# Patient Record
Sex: Female | Born: 1970 | Race: Asian | Hispanic: No | Marital: Married | Smoking: Never smoker
Health system: Southern US, Community
[De-identification: ages and names within clinical notes are randomized; demographics above are authoritative.]

## PROBLEM LIST (undated history)

## (undated) DIAGNOSIS — R87610 Atypical squamous cells of undetermined significance on cytologic smear of cervix (ASC-US): Secondary | ICD-10-CM

## (undated) DIAGNOSIS — F329 Major depressive disorder, single episode, unspecified: Secondary | ICD-10-CM

## (undated) DIAGNOSIS — F32A Depression, unspecified: Secondary | ICD-10-CM

## (undated) DIAGNOSIS — T7840XA Allergy, unspecified, initial encounter: Secondary | ICD-10-CM

## (undated) DIAGNOSIS — G43909 Migraine, unspecified, not intractable, without status migrainosus: Secondary | ICD-10-CM

## (undated) HISTORY — PX: APPENDECTOMY: SHX54

## (undated) HISTORY — DX: Major depressive disorder, single episode, unspecified: F32.9

## (undated) HISTORY — DX: Depression, unspecified: F32.A

## (undated) HISTORY — DX: Allergy, unspecified, initial encounter: T78.40XA

## (undated) HISTORY — PX: TONSILLECTOMY: SUR1361

## (undated) HISTORY — DX: Migraine, unspecified, not intractable, without status migrainosus: G43.909

## (undated) HISTORY — PX: NASAL SINUS SURGERY: SHX719

## (undated) HISTORY — DX: Atypical squamous cells of undetermined significance on cytologic smear of cervix (ASC-US): R87.610

---

## 2002-02-19 ENCOUNTER — Emergency Department (HOSPITAL_COMMUNITY): Admission: EM | Admit: 2002-02-19 | Discharge: 2002-02-19 | Payer: Self-pay | Admitting: Emergency Medicine

## 2002-02-19 ENCOUNTER — Encounter: Payer: Self-pay | Admitting: Emergency Medicine

## 2002-02-26 ENCOUNTER — Encounter: Admission: RE | Admit: 2002-02-26 | Discharge: 2002-02-26 | Payer: Self-pay | Admitting: Internal Medicine

## 2002-03-05 ENCOUNTER — Ambulatory Visit (HOSPITAL_COMMUNITY): Admission: RE | Admit: 2002-03-05 | Discharge: 2002-03-05 | Payer: Self-pay | Admitting: Internal Medicine

## 2002-03-26 ENCOUNTER — Encounter: Admission: RE | Admit: 2002-03-26 | Discharge: 2002-03-26 | Payer: Self-pay | Admitting: Internal Medicine

## 2002-03-28 ENCOUNTER — Encounter: Payer: Self-pay | Admitting: Internal Medicine

## 2002-03-28 ENCOUNTER — Ambulatory Visit (HOSPITAL_COMMUNITY): Admission: RE | Admit: 2002-03-28 | Discharge: 2002-03-28 | Payer: Self-pay | Admitting: Internal Medicine

## 2002-03-28 ENCOUNTER — Encounter: Admission: RE | Admit: 2002-03-28 | Discharge: 2002-03-28 | Payer: Self-pay | Admitting: Internal Medicine

## 2002-04-01 ENCOUNTER — Encounter: Payer: Self-pay | Admitting: Internal Medicine

## 2002-04-01 ENCOUNTER — Ambulatory Visit (HOSPITAL_COMMUNITY): Admission: RE | Admit: 2002-04-01 | Discharge: 2002-04-01 | Payer: Self-pay | Admitting: Internal Medicine

## 2002-07-02 ENCOUNTER — Encounter (INDEPENDENT_AMBULATORY_CARE_PROVIDER_SITE_OTHER): Payer: Self-pay | Admitting: Specialist

## 2002-07-03 ENCOUNTER — Inpatient Hospital Stay (HOSPITAL_COMMUNITY): Admission: EM | Admit: 2002-07-03 | Discharge: 2002-07-05 | Payer: Self-pay | Admitting: Emergency Medicine

## 2002-07-03 ENCOUNTER — Encounter: Payer: Self-pay | Admitting: Emergency Medicine

## 2002-12-19 ENCOUNTER — Emergency Department (HOSPITAL_COMMUNITY): Admission: EM | Admit: 2002-12-19 | Discharge: 2002-12-20 | Payer: Self-pay

## 2002-12-20 ENCOUNTER — Encounter: Payer: Self-pay | Admitting: Emergency Medicine

## 2002-12-22 ENCOUNTER — Emergency Department (HOSPITAL_COMMUNITY): Admission: EM | Admit: 2002-12-22 | Discharge: 2002-12-22 | Payer: Self-pay | Admitting: Emergency Medicine

## 2003-04-05 ENCOUNTER — Emergency Department (HOSPITAL_COMMUNITY): Admission: EM | Admit: 2003-04-05 | Discharge: 2003-04-06 | Payer: Self-pay

## 2003-04-05 ENCOUNTER — Inpatient Hospital Stay (HOSPITAL_COMMUNITY): Admission: AD | Admit: 2003-04-05 | Discharge: 2003-04-05 | Payer: Self-pay | Admitting: *Deleted

## 2003-06-05 ENCOUNTER — Encounter: Payer: Self-pay | Admitting: Obstetrics & Gynecology

## 2003-06-05 ENCOUNTER — Ambulatory Visit (HOSPITAL_COMMUNITY): Admission: RE | Admit: 2003-06-05 | Discharge: 2003-06-05 | Payer: Self-pay | Admitting: Obstetrics & Gynecology

## 2003-09-26 ENCOUNTER — Encounter: Payer: Self-pay | Admitting: Obstetrics & Gynecology

## 2003-09-26 ENCOUNTER — Ambulatory Visit (HOSPITAL_COMMUNITY): Admission: RE | Admit: 2003-09-26 | Discharge: 2003-09-26 | Payer: Self-pay | Admitting: Obstetrics & Gynecology

## 2003-10-26 ENCOUNTER — Inpatient Hospital Stay (HOSPITAL_COMMUNITY): Admission: AD | Admit: 2003-10-26 | Discharge: 2003-10-28 | Payer: Self-pay | Admitting: Obstetrics

## 2008-05-17 ENCOUNTER — Emergency Department (HOSPITAL_COMMUNITY): Admission: EM | Admit: 2008-05-17 | Discharge: 2008-05-17 | Payer: Self-pay | Admitting: Emergency Medicine

## 2009-02-20 ENCOUNTER — Ambulatory Visit: Payer: Self-pay | Admitting: Gastroenterology

## 2009-02-20 DIAGNOSIS — R1319 Other dysphagia: Secondary | ICD-10-CM

## 2009-02-20 DIAGNOSIS — K219 Gastro-esophageal reflux disease without esophagitis: Secondary | ICD-10-CM | POA: Insufficient documentation

## 2009-02-23 ENCOUNTER — Ambulatory Visit: Payer: Self-pay | Admitting: Gastroenterology

## 2010-01-05 ENCOUNTER — Encounter: Payer: Self-pay | Admitting: Cardiovascular Disease

## 2010-01-11 ENCOUNTER — Ambulatory Visit: Payer: Self-pay | Admitting: Cardiovascular Disease

## 2010-01-11 ENCOUNTER — Encounter: Payer: Self-pay | Admitting: Cardiology

## 2010-01-11 ENCOUNTER — Ambulatory Visit: Payer: Self-pay

## 2011-01-04 NOTE — Assessment & Plan Note (Signed)
Summary: ekg only/per dr Anne Hahn  Nurse Visit   Vital Signs:  Patient profile:   40 year old female Height:      57 inches Weight:      103 pounds BMI:     22.37 Resp:     14 per minute  Allergies: 1)  ! Amoxicillin

## 2011-01-04 NOTE — Consult Note (Signed)
Summary: [facility] Referral Form  [facility] Referral Form   Imported By: Roderic Ovens 02/05/2010 14:07:28  _____________________________________________________________________  External Attachment:    Type:   Image     Comment:   External Document

## 2011-01-04 NOTE — Procedures (Signed)
Summary: SUMMARY REPORT  SUMMARY REPORT   Imported By: Mirna Mires 01/18/2010 16:00:02  _____________________________________________________________________  External Attachment:    Type:   Image     Comment:   External Document

## 2011-04-22 NOTE — H&P (Signed)
[name], Zeis Rogers                              ACCOUNT NO.:  [id number]   MEDICAL RECORD NO.:  [id number]                   PATIENT TYPE:  INP   LOCATION:  5008                                 FACILITY:  [facility]   PHYSICIAN:  Adolph Pollack, M.D.            DATE OF BIRTH:  Jun 28, 1971   DATE OF ADMISSION:  07/02/2002  DATE OF DISCHARGE:  07/05/2002                                HISTORY & PHYSICAL   CHIEF COMPLAINT:  Lower abdominal pain.   HISTORY OF PRESENT ILLNESS:  Erika Rogers is a 40 year old [place] female  who has been in the [place] since January.  She has been having  intermittent lower abdominal pain since that time.  She was actually seen in  the emergency department back in March for abdominal pain and at that time  underwent an abdominal ultrasound.  It was noted that her amylase at 235.  Her abdominal ultrasound was normal.  She subsequently was placed on Zantac.  She has continued to intermittent pain and right now she has had severe  lower abdominal pain that began at 5:00 yesterday evening and has persisted.  It has been associated with nausea and vomiting.  History has been obtained  by way of an interpreter.   PAST MEDICAL HISTORY:  No chronic illnesses.  Previous operations:  None.   ALLERGIES:  None.   MEDICATIONS:  Zantac.   SOCIAL HISTORY:  No tobacco or alcohol use.   REVIEW OF SYSTEMS:  CARDIOVASCULAR:  No known heart disease or hypertension.  PULMONARY:  No chronic lung disease.  RENAL:  No chronic disease.  GYN:  She  has had two miscarriages and has very painful menstrual periods.   PHYSICAL EXAMINATION:  Generally tired-appearing thin female with  temperature 97.7, blood pressure 113/77, pulse 81.  Her extraocular ocular  motions are intact.  Neck supple without palpable masses.  Cardiovascular:  Heart demonstrates a regular rate and rhythm.  Respiratory:  Breath sounds  are equal and clear.  Respirations unlabored.  Abdomen is soft with  right  lower quadrant tenderness and guarding.  No masses palpable.  Hyperactive  bowel sounds noted.  Extremities:  No cyanosis or edema.   LABORATORY DATA:  White blood cell count is 14,400, hemoglobin 11.1.  Chem  normal except for potassium of 3.3 and a glucose of 119.  Amylase 169.  Urine pregnancy test negative.  UA demonstrates 3-6 white blood cells.  There are many epithelial cells and many bacteria.   CT scan demonstrates findings consistent with appendiceal fecalith and acute  appendicitis.   IMPRESSION:  Acute appendicitis.    PLAN:  Laparoscopic appendectomy.  The procedure and risks including but not  limited to bleeding, infection, general anesthesia, accidental damage to  intra-abdominal organs were explained to her by way of an interpreter.  She  seems to understands and agrees to proceed.  Adolph Pollack, M.D.    [name]  D:  07/03/2002  T:  07/05/2002  Job:  [id number]

## 2011-04-22 NOTE — Op Note (Signed)
NAMEAricela, Bertagnolli H'NE                              ACCOUNT NO.:  [id number]   MEDICAL RECORD NO.:  [id number]                   PATIENT TYPE:  INP   LOCATION:  5008                                 FACILITY:  [facility]   PHYSICIAN:  Adolph Pollack, M.D.            DATE OF BIRTH:  January 08, 1971   DATE OF PROCEDURE:  07/03/2002  DATE OF DISCHARGE:                                 OPERATIVE REPORT   PREOPERATIVE DIAGNOSIS:  Acute appendicitis.   POSTOPERATIVE DIAGNOSIS:  Acute appendicitis.   OPERATION/PROCEDURE:  Laparoscopic appendectomy.   SURGEON:  Adolph Pollack, M.D.   ANESTHESIA:  General endotracheal anesthesia.   INDICATIONS:  The patient is a 40 year old [place] female who speaks no  English with the onset of abdominal pain at 5 p.m. yesterday.  It is lower  abdominal pain.  She was evaluated in the emergency department and underwent  a CT scan which was consistent with appendicitis.  She is now brought to the  operating room.  The procedure and risks were discussed with her by way of  an interpreter.   DESCRIPTION OF PROCEDURE:  She was placed supine on the operating table and  general anesthesia was administered.  A Foley catheter was placed in the  bladder.  Her abdomen was sterilely prepped and draped.   A small subumbilical incision was made incising the skin and subcutaneous  tissue sharply.  A 1 cm incision was made in the midline fascia.  Abdominal  cavity was entered sharply and under direct vision.  A pursestring suture  with 3-0 Vicryl was placed around the fascial edges.  A Hasson trocar was  introduced into the peritoneal cavity and pneumoperitoneum was created by  insufflation of CO2 gas.  Next, a laparoscope was introduced.  I positioned  her in Trendelenburg with the right side tilted up.  A 5 mm trocar was  placed in the left lower quadrant.  We began manipulating the cecum and  noted an inflamed appendix that was adherent to the terminal ileum.  I  was  able the carefully and bluntly dissect this free.  The attachments of the  proximal appendage to the lateral sidewall were divided sharply.  I then  divided the mesoappendix with the Harmonic scalpel.  I came to the point of  the appendiceal cecal junction.  I then used the Endo GIA stapler to  amputate the appendix off the cecum.  Staple line was hemostatic and intact.   The appendix was put in an Endopouch bag and removed through the  subumbilical port.  We then copiously irrigated out the area with a liter of  normal saline.  No bleeding was noted.  The evacuated fluid was cleaned.  I  then removed the remaining trocars.  I closed the subumbilical fascia defect  by tightening up and tying down the pursestring suture after allowing the  pneumoperitoneum to escape.  The skin incisions were closed with 4-0  Monocryl subcuticular stitches.  Steri-Strips and sterile dressings were  applied.   She tolerated the procedure well with any apparent complications and was  taken to the recovery room in satisfactory condition.                                                Adolph Pollack, M.D.    [name]  D:  07/03/2002  T:  07/05/2002  Job:  [id number]

## 2012-08-06 ENCOUNTER — Ambulatory Visit (INDEPENDENT_AMBULATORY_CARE_PROVIDER_SITE_OTHER): Payer: Managed Care, Other (non HMO) | Admitting: Physician Assistant

## 2012-08-06 ENCOUNTER — Encounter: Payer: Self-pay | Admitting: Physician Assistant

## 2012-08-06 VITALS — BP 110/80 | HR 79 | Temp 98.6°F | Resp 16 | Ht <= 58 in | Wt 110.0 lb

## 2012-08-06 DIAGNOSIS — Z0000 Encounter for general adult medical examination without abnormal findings: Secondary | ICD-10-CM

## 2012-08-06 LAB — POCT CBC
Granulocyte percent: 58.5 %G (ref 37–80)
HCT, POC: 44.9 % (ref 37.7–47.9)
Hemoglobin: 14 g/dL (ref 12.2–16.2)
Lymph, poc: 2.1 (ref 0.6–3.4)
MCH, POC: 25.5 pg — AB (ref 27–31.2)
MCHC: 31.2 g/dL — AB (ref 31.8–35.4)
MCV: 82 fL (ref 80–97)
MID (cbc): 0.5 (ref 0–0.9)
MPV: 8.5 fL (ref 0–99.8)
POC Granulocyte: 3.7 (ref 2–6.9)
POC LYMPH PERCENT: 33.8 %L (ref 10–50)
POC MID %: 7.7 %M (ref 0–12)
Platelet Count, POC: 267 10*3/uL (ref 142–424)
RBC: 5.48 M/uL (ref 4.04–5.48)
RDW, POC: 14.5 %
WBC: 6.3 10*3/uL (ref 4.6–10.2)

## 2012-08-06 LAB — COMPREHENSIVE METABOLIC PANEL
ALT: 9 U/L (ref 0–35)
AST: 16 U/L (ref 0–37)
Albumin: 4.4 g/dL (ref 3.5–5.2)
Alkaline Phosphatase: 71 U/L (ref 39–117)
BUN: 11 mg/dL (ref 6–23)
CO2: 23 mEq/L (ref 19–32)
Calcium: 8.8 mg/dL (ref 8.4–10.5)
Chloride: 105 mEq/L (ref 96–112)
Creat: 0.57 mg/dL (ref 0.50–1.10)
Glucose, Bld: 95 mg/dL (ref 70–99)
Potassium: 4.3 mEq/L (ref 3.5–5.3)
Sodium: 138 mEq/L (ref 135–145)
Total Bilirubin: 0.7 mg/dL (ref 0.3–1.2)
Total Protein: 7.1 g/dL (ref 6.0–8.3)

## 2012-08-06 LAB — LIPID PANEL
Cholesterol: 229 mg/dL — ABNORMAL HIGH (ref 0–200)
HDL: 57 mg/dL (ref >39)
LDL Cholesterol: 144 mg/dL — ABNORMAL HIGH (ref 0–99)
Total CHOL/HDL Ratio: 4 Ratio
Triglycerides: 141 mg/dL (ref <150)
VLDL: 28 mg/dL (ref 0–40)

## 2012-08-06 LAB — POCT URINALYSIS DIPSTICK
Bilirubin, UA: NEGATIVE
Glucose, UA: NEGATIVE
Ketones, UA: NEGATIVE
Leukocytes, UA: NEGATIVE
Nitrite, UA: NEGATIVE
Protein, UA: NEGATIVE
Spec Grav, UA: 1.02
Urobilinogen, UA: 0.2
pH, UA: 7

## 2012-08-06 LAB — TSH: TSH: 1.995 u[IU]/mL (ref 0.350–4.500)

## 2012-08-06 LAB — POCT UA - MICROSCOPIC ONLY
Casts, Ur, LPF, POC: NEGATIVE
Crystals, Ur, HPF, POC: NEGATIVE
Mucus, UA: NEGATIVE
Yeast, UA: NEGATIVE

## 2012-08-06 NOTE — Progress Notes (Signed)
  Subjective:    Patient ID: Erika Rogers, female    DOB: 10-Jul-1971, 41 y.o.   MRN: [id number]  HPI 41 year old female presents for complete physical exam. She is doing well but has complaints of fatigue and muscle aches and so therefore wants to get her labwork checked.  Currently taking ketoprofen prescribed by her headache specialist, Dr. Yehuda Mao. She is seeing him every 6 weeks about her headaches and her dizziness but is here to have her blood checked.  Had a pap test 12/2010 that was normal. Normally gets her pap smear from her GYN and will call to make an appointment for this.      Review of Systems  Constitutional: Negative for fever and unexpected weight change.  HENT: Positive for neck pain. Negative for nosebleeds and congestion.   Eyes: Positive for itching.  Respiratory: Negative for cough and chest tightness.   Gastrointestinal: Negative for abdominal pain.  Musculoskeletal: Positive for back pain (neck pain) and arthralgias.  Neurological: Positive for dizziness and headaches.  Hematological: Does not bruise/bleed easily.  All other systems reviewed and are negative.       Objective:   Physical Exam  Constitutional: She is oriented to person, place, and time. She appears well-developed and well-nourished.  HENT:  Head: Normocephalic and atraumatic.  Right Ear: External ear normal.  Left Ear: External ear normal.  Mouth/Throat: No oropharyngeal exudate.  Eyes: Conjunctivae and EOM are normal. Pupils are equal, round, and reactive to light.  Neck: Normal range of motion. Neck supple. No tracheal deviation present. No thyromegaly present.  Cardiovascular: Normal rate, regular rhythm and normal heart sounds.   Pulmonary/Chest: Effort normal and breath sounds normal.  Abdominal: Soft. Bowel sounds are normal. There is no tenderness.  Musculoskeletal: Normal range of motion.  Lymphadenopathy:    She has no cervical adenopathy.  Neurological: She is alert and oriented to  person, place, and time.  Psychiatric: She has a normal mood and affect. Her behavior is normal. Judgment and thought content normal.          Assessment & Plan:   1. Routine general medical examination at a health care facility  POCT CBC, Comprehensive metabolic panel, TSH, Lipid panel, POCT UA - Microscopic Only, POCT urinalysis dipstick   Recommend follow up with Dr. Yehuda Mao regarding dizziness and headaches. This is a chronic problem that is not new and is unchanged from previous visits.   Will await labwork to determine if there is an organic cause for her fatigue and can determine if further evaluation is needed.  She will call her gynecologist to schedule her pap.

## 2013-01-15 ENCOUNTER — Other Ambulatory Visit: Payer: Self-pay | Admitting: Physician Assistant

## 2013-01-15 NOTE — Telephone Encounter (Signed)
Please pull paper chart.  

## 2013-01-18 NOTE — Telephone Encounter (Signed)
Chart pulled to PA pool MR [id number]

## 2013-04-17 ENCOUNTER — Ambulatory Visit (INDEPENDENT_AMBULATORY_CARE_PROVIDER_SITE_OTHER): Payer: Managed Care, Other (non HMO) | Admitting: Physician Assistant

## 2013-04-17 ENCOUNTER — Other Ambulatory Visit: Payer: Self-pay | Admitting: *Deleted

## 2013-04-17 ENCOUNTER — Ambulatory Visit: Payer: Managed Care, Other (non HMO)

## 2013-04-17 VITALS — BP 126/80 | HR 91 | Temp 98.5°F | Resp 16 | Ht <= 58 in | Wt 113.0 lb

## 2013-04-17 DIAGNOSIS — J309 Allergic rhinitis, unspecified: Secondary | ICD-10-CM

## 2013-04-17 MED ORDER — CETIRIZINE HCL 10 MG PO TABS: 10.0000 mg | ORAL_TABLET | Freq: Every day | ORAL | Status: DC

## 2013-04-17 MED ORDER — PREDNISONE 50 MG PO TABS: 50.0000 mg | ORAL_TABLET | Freq: Every day | ORAL | Status: DC

## 2013-04-17 MED ORDER — FLUTICASONE PROPIONATE 50 MCG/ACT NA SUSP: 2.0000 | Freq: Every day | NASAL | Status: DC

## 2013-04-17 MED ORDER — BENZONATATE 100 MG PO CAPS: 100.0000 mg | ORAL_CAPSULE | Freq: Three times a day (TID) | ORAL | Status: DC | PRN

## 2013-04-17 NOTE — Telephone Encounter (Signed)
error 

## 2013-04-17 NOTE — Patient Instructions (Addendum)
Begin using the fluticasone (Flonase) every day.  Also begin taking cetirizine (Zyrtec) every day.  - These two medicines taken regularly will help control your allergies.  Take prednisone 50mg  once daily for 3 days - this will help calm down your allergies quickly, then the Flonase and Zyrtec will take over  You can continue using Astepro as well.  Tessalon Perles every 8 hours as needed for cough.  If your symptoms continue to be worse at night, you can take diphenhydramine (Benadryl) before bed.  Please let me know if your symptoms are worsening or not improving   Allergic Rhinitis Allergic rhinitis is when the mucous membranes in the nose respond to allergens. Allergens are particles in the air that cause your body to have an allergic reaction. This causes you to release allergic antibodies. Through a chain of events, these eventually cause you to release histamine into the blood stream (hence the use of antihistamines). Although meant to be protective to the body, it is this release that causes your discomfort, such as frequent sneezing, congestion and an itchy runny nose.  CAUSES  The pollen allergens may come from grasses, trees, and weeds. This is seasonal allergic rhinitis, or "hay fever." Other allergens cause year-round allergic rhinitis (perennial allergic rhinitis) such as house dust mite allergen, pet dander and mold spores.  SYMPTOMS   Nasal stuffiness (congestion).  Runny, itchy nose with sneezing and tearing of the eyes.  There is often an itching of the mouth, eyes and ears. It cannot be cured, but it can be controlled with medications. DIAGNOSIS  If you are unable to determine the offending allergen, skin or blood testing may find it. TREATMENT   Avoid the allergen.  Medications and allergy shots (immunotherapy) can help.  Hay fever may often be treated with antihistamines in pill or nasal spray forms. Antihistamines block the effects of histamine. There are  over-the-counter medicines that may help with nasal congestion and swelling around the eyes. Check with your caregiver before taking or giving this medicine. If the treatment above does not work, there are many new medications your caregiver can prescribe. Stronger medications may be used if initial measures are ineffective. Desensitizing injections can be used if medications and avoidance fails. Desensitization is when a patient is given ongoing shots until the body becomes less sensitive to the allergen. Make sure you follow up with your caregiver if problems continue. SEEK MEDICAL CARE IF:   You develop fever (more than 100.5 F (38.1 C).  You develop a cough that does not stop easily (persistent).  You have shortness of breath.  You start wheezing.  Symptoms interfere with normal daily activities. Document Released: 08/16/2001 Document Revised: 02/13/2012 Document Reviewed: 02/25/2009 [facility] Patient Information 2013 [place], [place].

## 2013-04-17 NOTE — Progress Notes (Signed)
  Subjective:    Patient ID: Erika Rogers, female    DOB: [birth year], 42 y.o.   MRN: [id number]  HPI   Erika Rogers is a very pleasant 42 yr old female here with 1 month of cough.  Unable to sleep due to coughing, has actually vomited a couple times due to coughing.  Also endorses itchy ears, itchy ears, scratchy throat, and runny nose.  Does think she may have allergies.  Has been on several camping trips with her son who is a boy scout and symptoms were worse at those times.  No fever or chills.  Cough is non-productive.  Taking nyquil at night.  Did try some benadryl last week - this helped her itching, but didn't help cough so she stopped.  Uses Astepro nasal prn.  Does have hx of GERD, not currently taking meds - states that limiting certain things like soda, coffee helps control symptoms.   Review of Systems  Constitutional: Negative for fever and chills.  HENT: Positive for congestion, sore throat, rhinorrhea and postnasal drip. Negative for ear pain and sinus pressure.   Respiratory: Positive for cough. Negative for shortness of breath and wheezing.   Cardiovascular: Negative.   Gastrointestinal: Negative.   Musculoskeletal: Negative.   Allergic/Immunologic: Positive for environmental allergies.  Neurological: Negative.        Objective:   Physical Exam  Vitals reviewed. Constitutional: She is oriented to person, place, and time. She appears well-developed and well-nourished. No distress.  HENT:  Head: Normocephalic and atraumatic.  Right Ear: Tympanic membrane and ear canal normal.  Left Ear: Tympanic membrane and ear canal normal.  Nose: Mucosal edema and rhinorrhea present. Right sinus exhibits no maxillary sinus tenderness and no frontal sinus tenderness. Left sinus exhibits no maxillary sinus tenderness and no frontal sinus tenderness.  Mouth/Throat: Uvula is midline, oropharynx is clear and moist and mucous membranes are normal.  Cobblestoning posterior pharynx  Eyes: Conjunctivae  are normal. No scleral icterus.  Neck: Neck supple.  Cardiovascular: Normal rate, regular rhythm and normal heart sounds.   Pulmonary/Chest: Effort normal and breath sounds normal. She has no wheezes. She has no rales.  Lymphadenopathy:    She has no cervical adenopathy.  Neurological: She is alert and oriented to person, place, and time.  Skin: Skin is warm and dry.  Psychiatric: She has a normal mood and affect. Her behavior is normal.          Assessment & Plan:  Allergic rhinitis - Plan: benzonatate (TESSALON) 100 MG capsule, fluticasone (FLONASE) 50 MCG/ACT nasal spray, cetirizine (ZYRTEC) 10 MG tablet, DISCONTINUED: predniSONE (DELTASONE) 50 MG tablet   Erika Rogers is a very pleasant 42 yr old female with allergic rhinitis.  Symptoms have been present for approx 1 month, worse when outside.  Afebrile, VSS, lungs CTA.  Will do short course of prednisone.  Start Flonase and Zyrtec daily - discussed the importance of consistent use.  May continue Astepro.  Tessalon Perles for cough.  If worsening or not improving, pt will RTC.  If no improvement would change nasal steroid +/- singulair.  Would also consider treatment for GERD if cough persists.

## 2013-04-24 ENCOUNTER — Ambulatory Visit (INDEPENDENT_AMBULATORY_CARE_PROVIDER_SITE_OTHER): Payer: Managed Care, Other (non HMO) | Admitting: Family Medicine

## 2013-04-24 ENCOUNTER — Ambulatory Visit: Payer: Managed Care, Other (non HMO)

## 2013-04-24 VITALS — BP 120/78 | HR 86 | Temp 98.5°F | Resp 16 | Ht <= 58 in | Wt 114.0 lb

## 2013-04-24 DIAGNOSIS — R05 Cough: Secondary | ICD-10-CM

## 2013-04-24 DIAGNOSIS — K219 Gastro-esophageal reflux disease without esophagitis: Secondary | ICD-10-CM

## 2013-04-24 MED ORDER — OMEPRAZOLE 20 MG PO CPDR: 20.0000 mg | DELAYED_RELEASE_CAPSULE | Freq: Every day | ORAL | Status: DC

## 2013-04-24 MED ORDER — ALBUTEROL SULFATE HFA 108 (90 BASE) MCG/ACT IN AERS: 2.0000 | INHALATION_SPRAY | Freq: Four times a day (QID) | RESPIRATORY_TRACT | Status: DC

## 2013-04-24 MED ORDER — BECLOMETHASONE DIPROPIONATE 40 MCG/ACT IN AERS: 1.0000 | INHALATION_SPRAY | Freq: Two times a day (BID) | RESPIRATORY_TRACT | Status: DC

## 2013-04-24 MED ORDER — ALBUTEROL SULFATE (2.5 MG/3ML) 0.083% IN NEBU: 2.5000 mg | INHALATION_SOLUTION | Freq: Once | RESPIRATORY_TRACT | Status: DC

## 2013-04-24 NOTE — Progress Notes (Signed)
Subjective:    Patient ID: Erika Rogers, female    DOB: 1971/02/01, 42 y.o.   MRN: [id number]  HPI    Erika Rogers is a very pleasant 42 yr old female here with persistent cough.  I initially saw the patient two weeks ago at which time we started Flonase and Zyrtec.  We also did a short burst of prednisone as I felt her symptoms were primarily allergic.  Pt states that she continues to use Flonase and Zyrtec daily.  This combo has helped her nasal and eye symptoms, but the cough persists.  The cough continues to be non-productive.  Denies fever, chills, body aches, night sweats, weight loss.  The cough seems to be worse at night, it often keeps her awake.  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  She denies history of asthma.  She is a non-smoker.  She does have a hx of GERD that is untreated.  Feels like GERD symptoms are well controlled with diet and prn meds.    Review of Systems  Constitutional: Positive for fatigue. Negative for fever, chills, diaphoresis, appetite change and unexpected weight change.  HENT: Positive for sore throat. Negative for ear pain, congestion, rhinorrhea and postnasal drip.   Respiratory: Positive for cough. Negative for shortness of breath and wheezing.   Cardiovascular: Negative.   Gastrointestinal: Negative.   Musculoskeletal: Negative.   Skin: Negative.   Allergic/Immunologic: Positive for environmental allergies.  Neurological: Negative.        Objective:   Physical Exam  Vitals reviewed. Constitutional: She is oriented to person, place, and time. She appears well-developed and well-nourished. No distress.  HENT:  Head: Normocephalic and atraumatic.  Mouth/Throat: Uvula is midline, oropharynx is clear and moist and mucous membranes are normal.  Eyes: Conjunctivae are normal. No scleral icterus.  Cardiovascular: Normal rate, regular rhythm and normal heart sounds.  Exam reveals no gallop and no friction rub.   No murmur  heard. Pulmonary/Chest: Effort normal. She has no decreased breath sounds. She has no wheezes. She has no rhonchi. She has no rales.  Coughing spasm with deep insp  Neurological: She is alert and oriented to person, place, and time.  Skin: Skin is warm and dry.  Psychiatric: She has a normal mood and affect. Her behavior is normal.    [facility] reading (PRIMARY) by  Dr. Neva Seat - negative   Spirometry attempted but only one trial was acceptable, therefore I am not sure that data is reliable  Nebulized albuterol administered in clinic with some relief of symptoms     Assessment & Plan:  Cough - Plan: DG Chest 2 View, albuterol (PROVENTIL) (2.5 MG/3ML) 0.083% nebulizer solution 2.5 mg, albuterol (PROVENTIL HFA;VENTOLIN HFA) 108 (90 BASE) MCG/ACT inhaler, beclomethasone (QVAR) 40 MCG/ACT inhaler, omeprazole (PRILOSEC) 20 MG capsule  GERD (gastroesophageal reflux disease) - Plan: omeprazole (PRILOSEC) 20 MG capsule   Erika Rogers is a very pleasant 42 yr old female with 6 wks of cough.  At last visit we started Flonase and Zyrtec - this has helped with nasal and eye symptoms, but cough persists.  She continues to be afebrile with stable vitals.  CXR today is negative.  We attempted to perform PFTs but pt was only able to produce one adequate trial.  Subjective improvement in symptoms with albuterol neb.  Will initiate a trial of albuterol and qvar  - for possible RAD/cough variant asthma.  Will also start omeprazole as reflux may be contributing to cough.  Continue Flonase and Zyrtec.  Pt to recheck with me in two weeks.  At that time we may be able to peel back on some medications.  Discussed RTC precautions.

## 2013-04-24 NOTE — Progress Notes (Signed)
Xray read and patient discussed with Erika Rogers. Agree with assessment and plan of care per her note.   

## 2013-04-24 NOTE — Patient Instructions (Addendum)
Continue using the Flonase and Zyrtec every day.  For the next two days, use the albuterol inhaler every 6 hours.  After two days, use as needed (no more than 6 times per day).  Also begin using the Qvar inhaler twice daily.  Come back and see me in two weeks, so we can recheck you.  Come in sooner if you are worsening.

## 2013-05-08 ENCOUNTER — Ambulatory Visit (INDEPENDENT_AMBULATORY_CARE_PROVIDER_SITE_OTHER): Payer: Managed Care, Other (non HMO) | Admitting: Physician Assistant

## 2013-05-08 VITALS — BP 110/72 | HR 82 | Temp 98.3°F | Resp 16 | Ht <= 58 in | Wt 113.4 lb

## 2013-05-08 DIAGNOSIS — R05 Cough: Secondary | ICD-10-CM

## 2013-05-08 DIAGNOSIS — J329 Chronic sinusitis, unspecified: Secondary | ICD-10-CM

## 2013-05-08 MED ORDER — LEVOFLOXACIN 750 MG PO TABS: 750.0000 mg | ORAL_TABLET | Freq: Every day | ORAL | Status: DC

## 2013-05-08 NOTE — Patient Instructions (Addendum)
Continue using all the medicines that you have been doing.  In addition take the antibiotic (Levaquin/levofloxacin) for 7 days.  Be sure to finish the full course.  Come back and see me in about 2 weeks (I will be here 6/17 from 5-8:30pm and 6/19 from 8am-6pm - or you can come a different day if that works better for you)  Continue wearing a mask at work when you are using chemicals. If anything is getting worse in the next 2 weeks come back sooner.  If you are not improved after this course of antibiotics, we will refer you to a lung doctor

## 2013-05-08 NOTE — Progress Notes (Signed)
Subjective:    Patient ID: Erika Rogers, female    DOB: [birth year], 42 y.o.   MRN: [id number]  HPI   [name] is a very pleasant 42 yr old female here for follow up on cough.  I have seen pt twice now over the last month.  See previous notes for details.  Over the last month we have started Zyrtec and Flonase which have improved her allergic rhinitis symptoms.  At last visit we added qvar, albuterol, and omeprazole.  Pt is taking all meds as directed.  The cough is somewhat improved, but she continues to cough.  The cough continues to be non-productive.  It does seem to be worse with activity.  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  Today she endorses some HA and facial pain.  No fevers, chills, night sweats, weight loss.    Of note, pt treated for LTBI in 2004.  Review of Systems  Constitutional: Negative for fever and chills.  HENT: Positive for sore throat. Negative for ear pain, congestion and rhinorrhea.   Respiratory: Positive for cough and shortness of breath. Negative for wheezing.   Cardiovascular: Negative.   Gastrointestinal: Negative.   Musculoskeletal: Negative.   Skin: Negative.   Neurological: Negative.        Objective:   Physical Exam  Vitals reviewed. Constitutional: She is oriented to person, place, and time. She appears well-developed and well-nourished. No distress.  HENT:  Head: Normocephalic and atraumatic.  Right Ear: Tympanic membrane and ear canal normal.  Left Ear: Tympanic membrane and ear canal normal.  Nose: Right sinus exhibits maxillary sinus tenderness. Right sinus exhibits no frontal sinus tenderness. Left sinus exhibits maxillary sinus tenderness. Left sinus exhibits no frontal sinus tenderness.  Mouth/Throat: Uvula is midline, oropharynx is clear and moist and mucous membranes are normal.  Eyes: Conjunctivae are normal. No scleral icterus.  Neck: Neck supple.  Cardiovascular: Normal rate, regular rhythm and normal heart  sounds.   Pulmonary/Chest: Effort normal. She has no decreased breath sounds. She has no wheezes. She has no rhonchi. She has no rales.  Lymphadenopathy:    She has no cervical adenopathy.  Neurological: She is alert and oriented to person, place, and time.  Skin: Skin is warm and dry.  Psychiatric: She has a normal mood and affect. Her behavior is normal.     Peak flow approx 300 (predicted 500), though poor effort      Assessment & Plan:  Sinusitis - Plan: levofloxacin (LEVAQUIN) 750 MG tablet  Cough   Ms. Souder is a very pleasant 42 yr old female here with persistent cough.  Currently treated with zyrtec, flonase, qvar, albuterol, and omeprazole, but incomplete relief of symptoms.    - Allergic rhinitis symptoms are much improved with Flonase and Zyrtec, so will continue daily  - Will continue omeprazole as some improvement with cough after adding this  - Continue qvar and albuterol prn  - Today pt endorses some maxillary sinus tenderness and pressure.  Possible that sinusitis is contributing to cough. Pt with allergy to amox so will treat with levaquin x 7 days  - At this time ddx includes sinusitis, undertreated allergic rhinitis, laryngeal reflux, asthma, post-viral inflammatory cough, chemical irritation from work.  TB certainly comes to mind as well though pt has been treated for LTBI and CXR was negative two weeks ago.  Additionally she does not have fever, chills, night sweats, weight loss.  -  Will have pt RTC  in 2 wks.  At that time if no improvement might consider: changing zyrtec to xyzal, adding singulair, possibly steroid taper, screening for h. Pylori (though this seems less likely without reflux symptoms), repeat spirometry and possibly step up to inhaled laba/steroid if obstructive picture.  Would also consider referral to pulm for further eval

## 2013-07-01 ENCOUNTER — Other Ambulatory Visit: Payer: Self-pay | Admitting: Physician Assistant

## 2013-07-12 ENCOUNTER — Ambulatory Visit (INDEPENDENT_AMBULATORY_CARE_PROVIDER_SITE_OTHER): Payer: Managed Care, Other (non HMO) | Admitting: Family Medicine

## 2013-07-12 VITALS — BP 112/78 | HR 80 | Temp 98.3°F | Resp 16 | Ht <= 58 in | Wt 113.6 lb

## 2013-07-12 DIAGNOSIS — J309 Allergic rhinitis, unspecified: Secondary | ICD-10-CM

## 2013-07-12 DIAGNOSIS — J0100 Acute maxillary sinusitis, unspecified: Secondary | ICD-10-CM

## 2013-07-12 DIAGNOSIS — J301 Allergic rhinitis due to pollen: Secondary | ICD-10-CM

## 2013-07-12 MED ORDER — DOXYCYCLINE HYCLATE 100 MG PO CAPS: 100.0000 mg | ORAL_CAPSULE | Freq: Two times a day (BID) | ORAL | Status: DC

## 2013-07-12 MED ORDER — IPRATROPIUM BROMIDE 0.03 % NA SOLN: 2.0000 | Freq: Two times a day (BID) | NASAL | Status: DC

## 2013-07-12 NOTE — Progress Notes (Signed)
[address]   [place], [place]  [address]   [phone]  Subjective:    Patient ID: Erika Rogers, female    DOB: September 17, 1971, 42 y.o.   MRN: [id number]  HPI This 42 y.o. female presents for evaluation of sinus congestion.  Evaluated in 05/2013; treated for sinusitis with improvement.  Recurrent symptoms three weeks ago.  No fever but +chills/sweats.  +HA; +sinsu pain and pressure.  No ear pain; sometimes L ear hurts at night.  No sore throat.  No rhinorrhea.  +nasal congestion.  Yellow drainage; thick drainage.  +PND especially in morning.  No cough this episode.  No v/d.  Yesterday, Ibuprofen in morning.  Took Nyquil.  Taking Zyrtec daily; taking Flonase daily.   Has continued Flonase and Zyrtec since June 2014. No tobacco.  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  Review of Systems  Constitutional: Positive for chills and diaphoresis. Negative for fever and fatigue.  HENT: Positive for ear pain, congestion, sneezing, postnasal drip and sinus pressure. Negative for sore throat, rhinorrhea, trouble swallowing, dental problem and voice change.   Respiratory: Negative for cough, shortness of breath, wheezing and stridor.   Gastrointestinal: Negative for nausea, vomiting, abdominal pain and diarrhea.  Neurological: Positive for headaches.   Past Medical History  Diagnosis Date  . Allergy    Allergies  Allergen Reactions  . Amoxicillin    Current Outpatient Prescriptions on File Prior to Visit  Medication Sig Dispense Refill  . albuterol (PROVENTIL HFA;VENTOLIN HFA) 108 (90 BASE) MCG/ACT inhaler Inhale 2 puffs into the lungs every 6 (six) hours. For two days, and then as needed for cough/shortness of breath.  1 Inhaler  1  . beclomethasone (QVAR) 40 MCG/ACT inhaler Inhale 1 puff into the lungs 2 (two) times daily.  8.7 g  1  . buPROPion (ZYBAN) 150 MG 12 hr tablet Take 150 mg by mouth 2 (two) times daily.      . cetirizine (ZYRTEC) 10 MG tablet Take 1 tablet (10 mg total) by mouth daily.  30 tablet  11    . fluticasone (FLONASE) 50 MCG/ACT nasal spray Place 2 sprays into the nose daily.  16 g  12  . omeprazole (PRILOSEC) 20 MG capsule Take 1 capsule (20 mg total) by mouth daily.  30 capsule  3  . levofloxacin (LEVAQUIN) 750 MG tablet Take 1 tablet (750 mg total) by mouth daily.  7 tablet  0   No current facility-administered medications on file prior to visit.   History   Social History  . Marital Status: Married    Spouse Name: N/A    Number of Children: N/A  . Years of Education: N/A   Occupational History  . Not on file.   Social History Main Topics  . Smoking status: Never Smoker   . Smokeless tobacco: Not on file  . Alcohol Use: No  . Drug Use: No  . Sexually Active: Yes   Other Topics Concern  . Not on file   Social History Narrative   Single. Education: Grade School. Exercise: walks 2 times a week for 2 hours.       Objective:   Physical Exam  Nursing note and vitals reviewed. Constitutional: She is oriented to person, place, and time. She appears well-developed and well-nourished. No distress.  HENT:  Head: Normocephalic and atraumatic.  Right Ear: External ear normal.  Left Ear: External ear normal.  Nose: Mucosal edema and rhinorrhea present. Right sinus exhibits maxillary sinus tenderness and frontal sinus tenderness.  Left sinus exhibits maxillary sinus tenderness and frontal sinus tenderness.  Mouth/Throat: Oropharyngeal exudate present. No posterior oropharyngeal edema or posterior oropharyngeal erythema.  Eyes: Conjunctivae and EOM are normal. Pupils are equal, round, and reactive to light.  Neck: Normal range of motion. Neck supple. No thyromegaly present.  Cardiovascular: Normal rate, regular rhythm and normal heart sounds.   Pulmonary/Chest: Effort normal and breath sounds normal. She has no wheezes. She has no rales.  Lymphadenopathy:    She has no cervical adenopathy.  Neurological: She is alert and oriented to person, place, and time.  Skin: She  is not diaphoretic.  Psychiatric: She has a normal mood and affect. Her behavior is normal.       Assessment & Plan:  Acute maxillary sinusitis - Plan: doxycycline (VIBRAMYCIN) 100 MG capsule  Allergic rhinitis due to pollen   1. Acute maxillary sinusitis:  New.   Rx for Doxcycyline provided. 2.  Allergic Rhinitis: Worsening; continue Zyrtec and Flonase; rx for Atrovent nasal spray.  Meds ordered this encounter  Medications  . doxycycline (VIBRAMYCIN) 100 MG capsule    Sig: Take 1 capsule (100 mg total) by mouth 2 (two) times daily.    Dispense:  20 capsule    Refill:  0  . ipratropium (ATROVENT) 0.03 % nasal spray    Sig: Place 2 sprays into the nose 2 (two) times daily.    Dispense:  30 mL    Refill:  5

## 2013-07-12 NOTE — Patient Instructions (Addendum)
Vim xoang  (Sinusitis) Vim xoang l hi?n t??ng t?y ??, ?au nh?c v s?ng (vim) ? cc xoang c?nh m?i. Xoang c?nh m?i l cc ti kh trong x??ng c?a m?t (bn d??i m?t, gi?a trn ho?c trn m?t). Trong cc xoang c?nh m?i kh?e m?nh, d?ch nh?y c th? thot ra ngoi v khng kh c th? l?u thng qua chng theo ???ng m?i. Tuy nhin, khi cc xoang c?nh m?i b? vim, d?ch nh?y v khng kh c th? b? m?c k?t. ?i?u ny c th? cho php vi khu?n v vi trng khc pht tri?n v gy nhi?m trng.   Vim xoang c th? pht tri?n m?t cch nhanh chng v ko di trong m?t th?i gian ng?n (c?p tnh) ho?c ti?p t?c trong th?i gian di (mn tnh). Vim xoang ko di h?n 12 tu?n ???c coi l mn tnh.  NGUYN NHN  Nguyn nhn vim xoang bao g?m:   D? ?ng.  Di d?ng k?t c?u, ch?ng h?n nh? d?ch chuy?n c?a s?n phn cch l? m?i (l?ch vch ng?n), c th? lm gi?m lu?ng khng kh qua m?i c?ng nh? cc xoang v ?nh h??ng ??n kh? n?ng thot c?a xoang.  D? d?ng ch?c n?ng, ch?ng h?n nh? khi cc s?i lng nh? (mao) ph? cc xoang v gip lo?i b? d?ch nh?y khng ho?t ??ng ?ng ho?c khng c. TRI?U CH?NG  Cc tri?u ch?ng c?a vim xoang c?p tnh v mn tnh ??u gi?ng nhau. Cc tri?u ch?ng chnh l ?au v p l?c xung quanh xoang b? ?nh h??ng. Cc tri?u ch?ng khc bao g?m:   ?au r?ng trn.  ?au tai.  ?au ??u.  H?i th? hi.  Suy gi?m thnh gic v v? gic.  Ho n?ng h?n khi n?m.  M?t m?i.  S?t.  R? d?ch ??c t? m?i, th??ng c mu xanh v c th? ch?a m?.  S?ng v ?m h?n ? cc xoang b? ?nh h??ng. CH?N ?ON  Chuyn gia ch?m [place] y t? s? khm tr?c ti?p. Trong qu trnh xt nghi?m, chuyn gia ch?m [place] y t? c th?:   Soi m?i c?a b?n xem c cc d?u hi?u c?a s? pht tri?n b?t th??ng trong l? m?i (polyp m?i) khng.  G vo cc xoang b? ?nh h??ng ?? ki?m tra d?u hi?u nhi?m trng.  Xem bn trong cc xoang (n?i soi) b?ng m?t thi?t b? hnh ?nh ??c bi?t c g?n ?n (n?i soi) ???c ??a vo xoang. N?u chuyn gia ch?m [place] y t? nghi ng? r?ng b?n  b? vim xoang mn tnh, m?t ho?c nhi?u xt nghi?m sau ?y c th? ???c ?? ngh?:   Xt nghi?m d? ?ng.  L?y m?u c?y m?i-M?t m?u d?ch nh?y ???c l?y t? m?i c?a b?n v g?i ??n phng th nghi?m ?? ki?m tra vi khu?n.  T? bo h?c m?i-M?t m?u d?ch nh?y ???c l?y t? m?i c?a b?n v xt nghi?m b?i chuyn gia ch?m [place] y t? ?? xc ??nh xem tnh tr?ng vim xoang c?a b?n c lin quan ??n d? ?ng hay khng. ?I?U TR?  H?u h?t cc tr??ng h?p vim xoang c?p tnh c lin quan ??n nhi?m vi rt v s? t? kh?i trong vng 10 ngy. ?i khi thu?c ???c ch? ??nh ?? gip lm gi?m cc tri?u ch?ng (thu?c gi?m ?au, thu?c thng m?i, thu?c x?t m?i steroid ho?c bnh x?t n??c mu?i).  Tuy nhin, v?i vim xoang lin quan ??n nhi?m vi khu?n, chuyn gia ch?m [place] y t? s? k thu?c khng sinh. ?y l  nh?ng lo?i thu?c s? gip tiu di?t vi khu?n gy nhi?m trng.  Trong tr??ng h?p hi?m g?p, vim xoang gy b?i nhi?m trng do n?m. Trong nh?ng tr??ng h?p ny, chuyn gia ch?m [place] y t? s? k thu?c khng n?m.  M?t s? tr??ng h?p vim xoang mn tnh s? c?n ph?u thu?t. Ni chung, ?y l nh?ng tr??ng h?p vim xoang ti pht trn 3 l?n m?i n?m, m?c d ? th?c hi?n cc ph??ng php ?i?u tr? khc.  H??NG D?N CH?M [place] T?I NH   U?ng th?t nhi?u n??c. N??c gip lm long d?ch nh?y ?? xoang c th? thot d? dng h?n.  S? d?ng my t?o ?m.  Ht h?i n??c 3 ??n 4 l?n m?t ngy (v d?, ng?i trong phng t?m v?i vi sen ?ang ch?y).  ??t kh?n ?m, ?m ln m?t 3 ??n 4 l?n m?t ngy, ho?c theo ch? d?n c?a chuyn gia ch?m [place] y t?.  S? d?ng bnh x?t m?i ch?a n??c mu?i ?? gip lm ?m v lm s?ch xoang.  Ch? s? d?ng thu?c mua tr?c ti?p t?i hi?u thu?c ho?c thu?c theo toa ?? gi?m ?au, gi?m s? kh ch?u ho?c h? s?t theo ch? d?n c?a chuyn gia ch?m [place] y t? c?a b?n. HY NGAY L?P T?C THAM V?N V?I CHUYN GIA Y T? N?U:   B?n b? ?au gia t?ng ho?c ?au ??u n?ng.  B?n b? bu?n nn, nn m?a ho?c bu?n ng?.  B?n b? s?ng xung quanh m?t.  B?n c v?n ?? v? th? l?c.  B?n b? c?ng  c?.  B?n b? kh th?. ??M B?O B?N:   Hi?u cc h??ng d?n ny.  S? theo di tnh tr?ng c?a mnh.  S? yu c?u tr? gip ngay l?p t?c n?u b?n c?m th?y khng kh?e ho?c tnh tr?ng tr? nn t?i h?n. Document Released: 05/22/2012 [facility] Patient Information 2014 [place], [place].

## 2013-09-16 ENCOUNTER — Ambulatory Visit (INDEPENDENT_AMBULATORY_CARE_PROVIDER_SITE_OTHER): Payer: Managed Care, Other (non HMO) | Admitting: Family Medicine

## 2013-09-16 VITALS — BP 104/72 | HR 94 | Temp 98.5°F | Resp 16 | Ht <= 58 in | Wt 114.6 lb

## 2013-09-16 DIAGNOSIS — J019 Acute sinusitis, unspecified: Secondary | ICD-10-CM

## 2013-09-16 DIAGNOSIS — J0100 Acute maxillary sinusitis, unspecified: Secondary | ICD-10-CM

## 2013-09-16 DIAGNOSIS — J309 Allergic rhinitis, unspecified: Secondary | ICD-10-CM

## 2013-09-16 DIAGNOSIS — J302 Other seasonal allergic rhinitis: Secondary | ICD-10-CM

## 2013-09-16 MED ORDER — DOXYCYCLINE HYCLATE 100 MG PO CAPS: 100.0000 mg | ORAL_CAPSULE | Freq: Two times a day (BID) | ORAL | Status: DC

## 2013-09-16 MED ORDER — METHYLPREDNISOLONE (PAK) 4 MG PO TABS: ORAL_TABLET | ORAL | Status: DC

## 2013-09-16 NOTE — Progress Notes (Signed)
[facility]:  Office Visit  Chief Complaint:  Chief Complaint  Patient presents with  . Migraine    Right side, X 2 weeks  . Sinus Pressure    HPI: Erika Rogers is a 42 y.o. female who is here for 2 month history of sinus pressure and also HA and also neck and  Ear pain. Has tried otc medications without releif. No fevers, chill, cough, CP, SOB. No asthma She is also on zyrtec and also on flonase, atrovent NS prn. She has a history of allergies.  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   Past Medical History  Diagnosis Date  . Allergy   . Depression    History reviewed. No pertinent past surgical history. History   Social History  . Marital Status: Married    Spouse Name: N/A    Number of Children: N/A  . Years of Education: N/A   Social History Main Topics  . Smoking status: Never Smoker   . Smokeless tobacco: None  . Alcohol Use: No  . Drug Use: No  . Sexual Activity: Yes   Other Topics Concern  . None   Social History Narrative   Single. Education: Grade School. Exercise: walks 2 times a week for 2 hours.   History reviewed. No pertinent family history. Allergies  Allergen Reactions  . Amoxicillin    Prior to Admission medications   Medication Sig Start Date End Date Taking? Authorizing Provider  buPROPion (ZYBAN) 150 MG 12 hr tablet Take 150 mg by mouth 2 (two) times daily.   Yes Historical Provider, MD  cetirizine (ZYRTEC) 10 MG tablet Take 1 tablet (10 mg total) by mouth daily. 04/17/13  Yes Eleanore E Egan, PA-C  fluticasone (FLONASE) 50 MCG/ACT nasal spray Place 2 sprays into the nose daily. 04/17/13  Yes Eleanore E Debbra Riding, PA-C  ipratropium (ATROVENT) 0.03 % nasal spray Place 2 sprays into the nose 2 (two) times daily. 07/12/13  Yes Ethelda Chick, MD  omeprazole (PRILOSEC) 20 MG capsule Take 1 capsule (20 mg total) by mouth daily. 04/24/13  Yes Eleanore E Debbra Riding, PA-C   albuterol (PROVENTIL HFA;VENTOLIN HFA) 108 (90 BASE) MCG/ACT inhaler Inhale 2 puffs into the lungs every 6 (six) hours. For two days, and then as needed for cough/shortness of breath. 04/24/13   Godfrey Pick, PA-C  beclomethasone (QVAR) 40 MCG/ACT inhaler Inhale 1 puff into the lungs 2 (two) times daily. 04/24/13   Eleanore Delia Chimes, PA-C  doxycycline (VIBRAMYCIN) 100 MG capsule Take 1 capsule (100 mg total) by mouth 2 (two) times daily. 07/12/13   Ethelda Chick, MD  levofloxacin (LEVAQUIN) 750 MG tablet Take 1 tablet (750 mg total) by mouth daily. 05/08/13   Eleanore Delia Chimes, PA-C     ROS: The patient denies fevers, chills, night sweats, unintentional weight loss, chest pain, palpitations, wheezing, dyspnea on exertion, nausea, vomiting, abdominal pain, dysuria, hematuria, melena, numbness, weakness, or tingling.  All other systems have been reviewed and were otherwise negative with the exception of those mentioned in the HPI and as above.    PHYSICAL EXAM: Filed Vitals:   09/16/13 1530  BP: 104/72  Pulse: 94  Temp: 98.5 F (36.9 C)  Resp: 16   Filed Vitals:   09/16/13 1530  Height: 4' 7.5" (1.41 m)  Weight: 114 lb 9.6 oz (51.982 kg)   Body mass index is 26.15 kg/(m^2).  General: Alert, no acute distress HEENT:  Normocephalic, atraumatic, oropharynx patent. EOMI, PERRLA, fundoscopic exam nl. ,no exudates, TM nl. + sinus tenderness Cardiovascular:  Regular rate and rhythm, no rubs murmurs or gallops.  No Carotid bruits, radial pulse intact. No pedal edema.  Respiratory: Clear to auscultation bilaterally.  No wheezes, rales, or rhonchi.  No cyanosis, no use of accessory musculature GI: No organomegaly, abdomen is soft and non-tender, positive bowel sounds.  No masses. Skin: No rashes. Neurologic: Facial musculature symmetric. Psychiatric: Patient is appropriate throughout our interaction. Lymphatic: No cervical lymphadenopathy Musculoskeletal: Gait intact. Neck exam normal -full ROM,  no nychal rigidity   LABS: Results for orders placed in visit on 08/06/12  COMPREHENSIVE METABOLIC PANEL      Result Value Range   Sodium 138  135 - 145 mEq/L   Potassium 4.3  3.5 - 5.3 mEq/L   Chloride 105  96 - 112 mEq/L   CO2 23  19 - 32 mEq/L   Glucose, Bld 95  70 - 99 mg/dL   BUN 11  6 - 23 mg/dL   Creat 2.13  0.86 - 5.78 mg/dL   Total Bilirubin 0.7  0.3 - 1.2 mg/dL   Alkaline Phosphatase 71  39 - 117 U/L   AST 16  0 - 37 U/L   ALT 9  0 - 35 U/L   Total Protein 7.1  6.0 - 8.3 g/dL   Albumin 4.4  3.5 - 5.2 g/dL   Calcium 8.8  8.4 - 46.9 mg/dL  TSH      Result Value Range   TSH 1.995  0.350 - 4.500 uIU/mL  LIPID PANEL      Result Value Range   Cholesterol 229 (*) 0 - 200 mg/dL   Triglycerides 629  <528 mg/dL   HDL 57  >41 mg/dL   Total CHOL/HDL Ratio 4.0     VLDL 28  0 - 40 mg/dL   LDL Cholesterol 324 (*) 0 - 99 mg/dL  POCT CBC      Result Value Range   WBC 6.3  4.6 - 10.2 K/uL   Lymph, poc 2.1  0.6 - 3.4   POC LYMPH PERCENT 33.8  10 - 50 %L   MID (cbc) 0.5  0 - 0.9   POC MID % 7.7  0 - 12 %M   POC Granulocyte 3.7  2 - 6.9   Granulocyte percent 58.5  37 - 80 %G   RBC 5.48  4.04 - 5.48 M/uL   Hemoglobin 14.0  12.2 - 16.2 g/dL   HCT, POC 40.1  02.7 - 47.9 %   MCV 82.0  80 - 97 fL   MCH, POC 25.5 (*) 27 - 31.2 pg   MCHC 31.2 (*) 31.8 - 35.4 g/dL   RDW, POC 25.3     Platelet Count, POC 267  142 - 424 K/uL   MPV 8.5  0 - 99.8 fL  POCT UA - MICROSCOPIC ONLY      Result Value Range   WBC, Ur, HPF, POC 0-1     RBC, urine, microscopic 2-3     Bacteria, U Microscopic trace     Mucus, UA neg     Epithelial cells, urine per micros 1-3     Crystals, Ur, HPF, POC neg     Casts, Ur, LPF, POC neg     Yeast, UA neg    POCT URINALYSIS DIPSTICK      Result Value Range   Color, UA  yellow     Clarity, UA clear     Glucose, UA neg     Bilirubin, UA neg     Ketones, UA neg     Spec Grav, UA 1.020     Blood, UA trace     pH, UA 7.0     Protein, UA neg      Urobilinogen, UA 0.2     Nitrite, UA neg     Leukocytes, UA Negative       EKG/XRAY:   Primary read interpreted by Dr. Conley Rolls at [facility].   ASSESSMENT/PLAN: Encounter Diagnoses  Name Primary?  . Acute sinusitis Yes  . Seasonal allergies   . Acute maxillary sinusitis    Rx Doxycyline and also Medrol dose pack May take flonase after steroid pack Advise to try saline nasal sprays and cont to wear mask, take zyrtec regular.  Reviewed medicines with her, she needs to check with her PCP about her Wellbutrin dose BID vs daily, she is only taking it daily C/w Zyrtec F/u prn Gross sideeffects, risk and benefits, and alternatives of medications d/w patient. Patient is aware that all medications have potential sideeffects and we are unable to predict every sideeffect or drug-drug interaction that may occur.  Hamilton Capri PHUONG, DO 09/16/2013 4:17 PM

## 2013-10-15 ENCOUNTER — Ambulatory Visit (INDEPENDENT_AMBULATORY_CARE_PROVIDER_SITE_OTHER): Payer: Managed Care, Other (non HMO) | Admitting: Family Medicine

## 2013-10-15 VITALS — BP 112/78 | HR 84 | Temp 98.0°F | Resp 18 | Ht <= 58 in | Wt 114.4 lb

## 2013-10-15 DIAGNOSIS — R5383 Other fatigue: Secondary | ICD-10-CM

## 2013-10-15 DIAGNOSIS — R109 Unspecified abdominal pain: Secondary | ICD-10-CM

## 2013-10-15 DIAGNOSIS — R5381 Other malaise: Secondary | ICD-10-CM

## 2013-10-15 LAB — POCT CBC
Granulocyte percent: 52.1 %G (ref 37–80)
HCT, POC: 40.8 % (ref 37.7–47.9)
Hemoglobin: 12.5 g/dL (ref 12.2–16.2)
Lymph, poc: 3.3 (ref 0.6–3.4)
MCH, POC: 25.8 pg — AB (ref 27–31.2)
MCHC: 30.6 g/dL — AB (ref 31.8–35.4)
MCV: 84.1 fL (ref 80–97)
MID (cbc): 0.7 (ref 0–0.9)
MPV: 8.2 fL (ref 0–99.8)
POC Granulocyte: 4.3 (ref 2–6.9)
POC LYMPH PERCENT: 39.7 %L (ref 10–50)
POC MID %: 8.2 %M (ref 0–12)
Platelet Count, POC: 287 10*3/uL (ref 142–424)
RBC: 4.85 M/uL (ref 4.04–5.48)
RDW, POC: 14.8 %
WBC: 8.3 10*3/uL (ref 4.6–10.2)

## 2013-10-15 LAB — TSH: TSH: 2.057 u[IU]/mL (ref 0.350–4.500)

## 2013-10-15 LAB — POCT URINALYSIS DIPSTICK
Bilirubin, UA: NEGATIVE
Glucose, UA: NEGATIVE
Ketones, UA: NEGATIVE
Leukocytes, UA: NEGATIVE
Nitrite, UA: NEGATIVE
Protein, UA: NEGATIVE
Spec Grav, UA: 1.02
Urobilinogen, UA: 0.2
pH, UA: 7

## 2013-10-15 LAB — COMPREHENSIVE METABOLIC PANEL
ALT: 9 U/L (ref 0–35)
AST: 17 U/L (ref 0–37)
Albumin: 4 g/dL (ref 3.5–5.2)
Alkaline Phosphatase: 69 U/L (ref 39–117)
BUN: 9 mg/dL (ref 6–23)
CO2: 25 mEq/L (ref 19–32)
Calcium: 8.7 mg/dL (ref 8.4–10.5)
Chloride: 103 mEq/L (ref 96–112)
Creat: 0.65 mg/dL (ref 0.50–1.10)
Glucose, Bld: 91 mg/dL (ref 70–99)
Potassium: 3.8 mEq/L (ref 3.5–5.3)
Sodium: 138 mEq/L (ref 135–145)
Total Bilirubin: 0.4 mg/dL (ref 0.3–1.2)
Total Protein: 7.1 g/dL (ref 6.0–8.3)

## 2013-10-15 LAB — POCT UA - MICROSCOPIC ONLY
Amorphous: POSITIVE
Casts, Ur, LPF, POC: NEGATIVE
Crystals, Ur, HPF, POC: NEGATIVE
Mucus, UA: POSITIVE
Yeast, UA: NEGATIVE

## 2013-10-15 NOTE — Progress Notes (Signed)
Subjective:    Patient ID: Erika Rogers, female    DOB: Dec 12, 1970, 42 y.o.   MRN: [id number]  This chart was scribed for Elvina Sidle, MD by Blanchard Kelch, ED Scribe. The patient was seen in room 11. Patient's care was started at 4:50 PM.   HPI  Erika Rogers is a 42 y.o. female who presents to office complaining of constant fatigue. She states that she sleeps while driving and working occasionally due to the fatigue. Her coworkers have asked her if she isn't getting sleep at home due to the problem but she is sleeping well at home. She states that "it is easy to sleep." She is also complaining of congestion. She states that she has urinary problems occasionally.  She denies any cough. Her last menstrual period was November 1st.  She is working about six hours a day at [employer]. And states it is cold at the place that she works.   Review of Systems  Constitutional: Positive for fatigue. Negative for fever.  HENT: Positive for congestion. Negative for drooling.   Eyes: Negative for discharge.  Respiratory: Negative for cough.   Cardiovascular: Negative for leg swelling.  Gastrointestinal: Negative for vomiting and abdominal pain.  Endocrine: Negative for polyuria.  Genitourinary: Positive for dysuria and frequency.  Musculoskeletal: Negative for gait problem.  Skin: Negative for rash.  Allergic/Immunologic: Negative for immunocompromised state.  Neurological: Negative for speech difficulty.  Hematological: Negative for adenopathy.  Psychiatric/Behavioral: Negative for confusion.       Objective:   Physical Exam  Nursing note and vitals reviewed. Constitutional: She is oriented to person, place, and time. She appears well-developed and well-nourished. No distress.  HENT:  Head: Normocephalic and atraumatic.  Right Ear: Tympanic membrane, external ear and ear canal normal.  Left Ear: Tympanic membrane, external ear and ear canal normal.  Eyes: EOM are normal.  Neck: Normal  range of motion. Neck supple. No tracheal deviation present. No thyromegaly present.  Cardiovascular: Normal rate and regular rhythm.   No murmur heard. Pulmonary/Chest: Effort normal and breath sounds normal. No respiratory distress. She has no wheezes.  Abdominal: Soft. Bowel sounds are normal. She exhibits no distension. There is no tenderness. There is CVA tenderness.  Musculoskeletal: Normal range of motion.  Lymphadenopathy:    She has no cervical adenopathy.  Neurological: She is alert and oriented to person, place, and time.  Skin: Skin is warm and dry.  Psychiatric: She has a normal mood and affect. Her behavior is normal.   Mild tenderness in RLQ    Results for orders placed in visit on 10/15/13  POCT CBC      Result Value Range   WBC 8.3  4.6 - 10.2 K/uL   Lymph, poc 3.3  0.6 - 3.4   POC LYMPH PERCENT 39.7  10 - 50 %L   MID (cbc) 0.7  0 - 0.9   POC MID % 8.2  0 - 12 %M   POC Granulocyte 4.3  2 - 6.9   Granulocyte percent 52.1  37 - 80 %G   RBC 4.85  4.04 - 5.48 M/uL   Hemoglobin 12.5  12.2 - 16.2 g/dL   HCT, POC 11.9  14.7 - 47.9 %   MCV 84.1  80 - 97 fL   MCH, POC 25.8 (*) 27 - 31.2 pg   MCHC 30.6 (*) 31.8 - 35.4 g/dL   RDW, POC 82.9     Platelet Count, POC 287  142 - 424 K/uL  MPV 8.2  0 - 99.8 fL  POCT UA - MICROSCOPIC ONLY      Result Value Range   WBC, Ur, HPF, POC 1-2     RBC, urine, microscopic 5-6     Bacteria, U Microscopic 1+     Mucus, UA pos     Epithelial cells, urine per micros 3-6     Crystals, Ur, HPF, POC neg     Casts, Ur, LPF, POC neg     Yeast, UA neg     Amorphous pos    POCT URINALYSIS DIPSTICK      Result Value Range   Color, UA yellow     Clarity, UA clear     Glucose, UA neg     Bilirubin, UA neg     Ketones, UA neg     Spec Grav, UA 1.020     Blood, UA small     pH, UA 7.0     Protein, UA neg     Urobilinogen, UA 0.2     Nitrite, UA neg     Leukocytes, UA Negative      Assessment & Plan:    This 42 year old woman is  chronically tired. Possible that she has a thyroid problem or some internal derangement. Just as likely is that she's got some depression and doesn't like her job.  Plan: We'll await thyroid functions and metabolic profile.

## 2013-10-16 ENCOUNTER — Other Ambulatory Visit: Payer: Self-pay | Admitting: Family Medicine

## 2013-10-16 DIAGNOSIS — R5383 Other fatigue: Secondary | ICD-10-CM

## 2013-11-26 ENCOUNTER — Other Ambulatory Visit: Payer: Self-pay | Admitting: Otolaryngology

## 2014-10-17 ENCOUNTER — Ambulatory Visit: Payer: Managed Care, Other (non HMO) | Admitting: Women's Health

## 2015-05-02 ENCOUNTER — Encounter (HOSPITAL_COMMUNITY): Payer: Self-pay | Admitting: Emergency Medicine

## 2015-05-02 ENCOUNTER — Inpatient Hospital Stay (HOSPITAL_COMMUNITY)
Admission: EM | Admit: 2015-05-02 | Discharge: 2015-05-05 | DRG: 392 | Disposition: A | Discharge status: Home / Self Care | Payer: Managed Care, Other (non HMO) | Attending: Internal Medicine | Admitting: Internal Medicine

## 2015-05-02 DIAGNOSIS — K219 Gastro-esophageal reflux disease without esophagitis: Secondary | ICD-10-CM | POA: Diagnosis present

## 2015-05-02 DIAGNOSIS — K921 Melena: Secondary | ICD-10-CM

## 2015-05-02 DIAGNOSIS — K625 Hemorrhage of anus and rectum: Secondary | ICD-10-CM | POA: Diagnosis present

## 2015-05-02 DIAGNOSIS — R197 Diarrhea, unspecified: Secondary | ICD-10-CM

## 2015-05-02 DIAGNOSIS — A09 Infectious gastroenteritis and colitis, unspecified: Secondary | ICD-10-CM | POA: Diagnosis not present

## 2015-05-02 DIAGNOSIS — R1084 Generalized abdominal pain: Secondary | ICD-10-CM | POA: Diagnosis not present

## 2015-05-02 DIAGNOSIS — E861 Hypovolemia: Secondary | ICD-10-CM | POA: Diagnosis present

## 2015-05-02 DIAGNOSIS — G43909 Migraine, unspecified, not intractable, without status migrainosus: Secondary | ICD-10-CM | POA: Diagnosis present

## 2015-05-02 MED ORDER — ONDANSETRON HCL 4 MG/2ML IJ SOLN
4.0000 mg | Freq: Once | INTRAMUSCULAR | Status: AC
  Administered 2015-05-03: 4 mg via INTRAVENOUS
  Filled 2015-05-02: qty 2

## 2015-05-02 MED ORDER — ONDANSETRON HCL 4 MG/2ML IJ SOLN
4.0000 mg | Freq: Once | INTRAMUSCULAR | Status: AC
  Administered 2015-05-02: 4 mg via INTRAVENOUS
  Filled 2015-05-02: qty 2

## 2015-05-02 MED ORDER — SODIUM CHLORIDE 0.9 % IV BOLUS (SEPSIS)
1000.0000 mL | Freq: Once | INTRAVENOUS | Status: AC
Start: 2015-05-02 — End: 2015-05-03
  Administered 2015-05-02: 1000 mL via INTRAVENOUS

## 2015-05-02 MED ORDER — MORPHINE SULFATE 4 MG/ML IJ SOLN
4.0000 mg | Freq: Once | INTRAMUSCULAR | Status: AC
  Administered 2015-05-02: 4 mg via INTRAVENOUS
  Filled 2015-05-02: qty 1

## 2015-05-02 NOTE — ED Notes (Signed)
Pt. reports generalized abdominal pain with emesis , diarrhea and headache onset this evening .

## 2015-05-02 NOTE — ED Provider Notes (Addendum)
CSN: [id number]     Arrival date & time 05/02/15  2241 History  This chart was scribed for Derwood Kaplan, MD by Phillis Haggis, ED Scribe. This patient was seen in room A07C/A07C and patient care was started at 11:18 PM.     Chief Complaint  Patient presents with  . Abdominal Pain   The history is provided by the patient and a relative. No language interpreter was used.  HPI Comments: Erika Rogers is a 44 y.o. female who presents to the Emergency Department complaining of waxing and waning generalized sharp abdominal pain onset 4 hours ago. Patient's daughter reports associated emesis, diarrhea, lightheadedness, dizziness and headache. Daughter reports that the patient had an episode of emesis, nausea and fatigue lasting 30 minutes without abdominal pain prior to the pain starting. Patient reports that she has had over 10 episodes of emesis since 8 PM, voiding the food she ate.Daughter reports that the patient last ate at 330 PM today, eating rice, meat, eggplant and pepper. She states that her son has eaten the same foods as her today. She states that her diarrhea came after her abdominal pain and may have had some blood in it; reports that the diarrhea is very watery and has been as frequent as her episodes of emesis; reports rotten egg smell. She reports that pain is relieved temporarily with bowel movements but will come back.  She denies dysuria, hematuria, frequency, or urgency. She denies having pain or symptoms this severe before. Daughter denies sick contacts. Patient reports taking migraine medication everyday. She reports history of appendix surgery. Patient does not speak a lot of English; translation provided by daughter.   Past Medical History  Diagnosis Date  . Allergy   . Depression    Past Surgical History  Procedure Laterality Date  . Tonsillectomy    . Appendectomy     No family history on file. History  Substance Use Topics  . Smoking status: Never Smoker   . Smokeless  tobacco: Not on file  . Alcohol Use: No   OB History    No data available     Review of Systems  Gastrointestinal: Positive for nausea, vomiting, abdominal pain and diarrhea.  Genitourinary: Negative for dysuria, urgency, frequency and hematuria.  Neurological: Positive for dizziness, light-headedness and headaches.  All other systems reviewed and are negative.  Allergies  Amoxicillin  Home Medications   Prior to Admission medications   Medication Sig Start Date End Date Taking? Authorizing Provider  albuterol (PROVENTIL HFA;VENTOLIN HFA) 108 (90 BASE) MCG/ACT inhaler Inhale 2 puffs into the lungs every 6 (six) hours. For two days, and then as needed for cough/shortness of breath. 04/24/13  Yes Eleanore E Egan, PA-C   BP 120/83 mmHg  Pulse 99  Temp(Src) 98.1 F (36.7 C) (Oral)  Resp 22  SpO2 99%  LMP 04/05/2015   Physical Exam  Constitutional: She is oriented to person, place, and time. She appears well-developed and well-nourished.  HENT:  Head: Normocephalic and atraumatic.  Mouth/Throat: Mucous membranes are dry.  Mucous membranes appear dry, sclera is clear  Eyes: EOM are normal.  Neck: Normal range of motion. Neck supple.  Cardiovascular: Normal rate, regular rhythm and normal heart sounds.   Cap refill < 3 seconds  Pulmonary/Chest: Effort normal and breath sounds normal.  Abdominal: There is tenderness in the epigastric area. There is no rebound, no guarding and negative Murphy's sign.  Hypoactive bowel sounds. Generalized abd tenderness, periumbilical and upper quadranrts  Musculoskeletal: Normal range  of motion.  Neurological: She is alert and oriented to person, place, and time.  Skin: Skin is warm and dry.  Psychiatric: She has a normal mood and affect. Her behavior is normal.  Nursing note and vitals reviewed.   ED Course  Procedures (including critical care time) DIAGNOSTIC STUDIES: Oxygen Saturation is 99% on room air, normal by my interpretation.     COORDINATION OF CARE: 11:27 PM-Discussed treatment plan which includes labs, fluids, pain and nausea medication with pt at bedside and pt agreed to plan.   Reassessment: 1:55 AM-will perform rectal exam upon pt complaints of bloody diarrhea  3:05 AM- Pt's bloody stools have worsened; ordering   Labs Review Labs Reviewed  URINALYSIS, ROUTINE W REFLEX MICROSCOPIC (NOT AT [facility]) - Abnormal; Notable for the following:    Color, Urine AMBER (*)    APPearance CLOUDY (*)    Hgb urine dipstick MODERATE (*)    Protein, ur 100 (*)    All other components within normal limits  CBC WITH DIFFERENTIAL/PLATELET - Abnormal; Notable for the following:    RBC 5.51 (*)    MCV 77.1 (*)    MCH 25.8 (*)    Neutrophils Relative % 78 (*)    All other components within normal limits  COMPREHENSIVE METABOLIC PANEL - Abnormal; Notable for the following:    Potassium 3.3 (*)    Glucose, Bld 131 (*)    Total Protein 8.3 (*)    All other components within normal limits  LIPASE, BLOOD - Abnormal; Notable for the following:    Lipase 21 (*)    All other components within normal limits  CBC WITH DIFFERENTIAL/PLATELET - Abnormal; Notable for the following:    RBC 5.16 (*)    MCV 77.3 (*)    MCH 25.8 (*)    Neutrophils Relative % 87 (*)    Lymphocytes Relative 5 (*)    Lymphs Abs 0.4 (*)    All other components within normal limits  POC OCCULT BLOOD, ED - Abnormal; Notable for the following:    Fecal Occult Bld POSITIVE (*)    All other components within normal limits  I-STAT CG4 LACTIC ACID, ED - Abnormal; Notable for the following:    Lactic Acid, Venous 2.24 (*)    All other components within normal limits  STOOL CULTURE  CLOSTRIDIUM DIFFICILE BY PCR (NOT AT [facility])  MAGNESIUM  URINE MICROSCOPIC-ADD ON  CBC WITH DIFFERENTIAL/PLATELET  POC URINE PREG, ED  TYPE AND SCREEN   Imaging Review Dg Abd Acute W/chest  05/03/2015   CLINICAL DATA:  Evaluate for small bowel obstruction. Nausea, vomiting,  diarrhea.  EXAM: DG ABDOMEN ACUTE W/ 1V CHEST  COMPARISON:  None.  FINDINGS: Proximal colonic fluid levels, nonspecific but potentially related to history of diarrhea. No evidence of bowel obstruction or perforation. No concerning intra-abdominal mass effect or calcification.  Normal heart size and mediastinal contours. No acute infiltrate or edema. No effusion or pneumothorax. No acute osseous findings.  IMPRESSION: Negative abdominal radiographs.  No acute cardiopulmonary disease.   Electronically Signed   By: Marnee SpringJonathon  Watts M.D.   On: 05/03/2015 01:04     EKG Interpretation None      MDM   Final diagnoses:  Bloody diarrhea  Hematochezia    I personally performed the services described in this documentation, which was scribed in my presence. The recorded information has been reviewed and is accurate.  Pt comes in with cc of abd pain. She has generalized, sudden onset abd  pain with nausea, diarrhea. Exam not consistent with cholelithiasis. No clinical concerns for SBO right now. Likely gastroenteritis. No sick contacts, no risk factors for cdiff. Will get basic labs, hydrate - as she has some dizziness and get AAS to ensure there is no obstructive pathology.   2:51 AM PT is now having bloody stools. Still has epigastric type pain. Concerns for colitis vs. Peptic ulcer / upper GI bleeding - favoring the latter given the pain severity, location.   3:09 AM Just had another BRBPR. Will start protonix drip. HR has increased, BP has come down to 90 SBP. Will get repeat labs, type and screen. GI consulted.  CRITICAL CARE Performed by: Derwood Kaplan   Total critical care time: 70 minutes  Critical care time was exclusive of separately billable procedures and treating other patients.  Critical care was necessary to treat or prevent imminent or life-threatening deterioration.  Critical care was time spent personally by me on the following activities: development of treatment plan  with patient and/or surrogate as well as nursing, discussions with consultants, evaluation of patient's response to treatment, examination of patient, obtaining history from patient or surrogate, ordering and performing treatments and interventions, ordering and review of laboratory studies, ordering and review of radiographic studies, pulse oximetry and re-evaluation of patient's condition.   Derwood Kaplan, MD 05/03/15 [id number]  Derwood Kaplan, MD 05/03/15 [phone]

## 2015-05-02 NOTE — ED Notes (Signed)
Dr. Nanavati at bedside 

## 2015-05-03 ENCOUNTER — Encounter (HOSPITAL_COMMUNITY)
Admission: EM | Disposition: A | Discharge status: Home / Self Care | Payer: Self-pay | Source: Home / Self Care | Attending: Internal Medicine

## 2015-05-03 ENCOUNTER — Encounter (HOSPITAL_COMMUNITY): Payer: Self-pay | Admitting: Radiology

## 2015-05-03 ENCOUNTER — Emergency Department (HOSPITAL_COMMUNITY): Payer: Managed Care, Other (non HMO)

## 2015-05-03 DIAGNOSIS — K921 Melena: Secondary | ICD-10-CM

## 2015-05-03 DIAGNOSIS — E860 Dehydration: Secondary | ICD-10-CM | POA: Diagnosis not present

## 2015-05-03 DIAGNOSIS — Z789 Other specified health status: Secondary | ICD-10-CM | POA: Diagnosis not present

## 2015-05-03 DIAGNOSIS — I9589 Other hypotension: Secondary | ICD-10-CM | POA: Diagnosis not present

## 2015-05-03 DIAGNOSIS — R197 Diarrhea, unspecified: Secondary | ICD-10-CM

## 2015-05-03 DIAGNOSIS — G43909 Migraine, unspecified, not intractable, without status migrainosus: Secondary | ICD-10-CM | POA: Diagnosis present

## 2015-05-03 DIAGNOSIS — R1013 Epigastric pain: Secondary | ICD-10-CM | POA: Diagnosis not present

## 2015-05-03 DIAGNOSIS — K219 Gastro-esophageal reflux disease without esophagitis: Secondary | ICD-10-CM | POA: Diagnosis present

## 2015-05-03 DIAGNOSIS — R112 Nausea with vomiting, unspecified: Secondary | ICD-10-CM | POA: Diagnosis not present

## 2015-05-03 DIAGNOSIS — K922 Gastrointestinal hemorrhage, unspecified: Secondary | ICD-10-CM

## 2015-05-03 DIAGNOSIS — E861 Hypovolemia: Secondary | ICD-10-CM | POA: Diagnosis present

## 2015-05-03 DIAGNOSIS — R1084 Generalized abdominal pain: Secondary | ICD-10-CM | POA: Diagnosis present

## 2015-05-03 DIAGNOSIS — A09 Infectious gastroenteritis and colitis, unspecified: Secondary | ICD-10-CM | POA: Diagnosis present

## 2015-05-03 DIAGNOSIS — K625 Hemorrhage of anus and rectum: Secondary | ICD-10-CM | POA: Diagnosis present

## 2015-05-03 HISTORY — PX: ESOPHAGOGASTRODUODENOSCOPY: SHX5428

## 2015-05-03 LAB — BASIC METABOLIC PANEL
ANION GAP: 9 (ref 5–15)
BUN: 10 mg/dL (ref 6–20)
CHLORIDE: 109 mmol/L (ref 101–111)
CO2: 18 mmol/L — ABNORMAL LOW (ref 22–32)
Calcium: 7.9 mg/dL — ABNORMAL LOW (ref 8.9–10.3)
Creatinine, Ser: 0.68 mg/dL (ref 0.44–1.00)
GLUCOSE: 111 mg/dL — AB (ref 65–99)
POTASSIUM: 4 mmol/L (ref 3.5–5.1)
SODIUM: 136 mmol/L (ref 135–145)

## 2015-05-03 LAB — COMPREHENSIVE METABOLIC PANEL
ALT: 14 U/L (ref 14–54)
AST: 21 U/L (ref 15–41)
Albumin: 4.3 g/dL (ref 3.5–5.0)
Alkaline Phosphatase: 78 U/L (ref 38–126)
Anion gap: 12 (ref 5–15)
BUN: 15 mg/dL (ref 6–20)
CALCIUM: 9.4 mg/dL (ref 8.9–10.3)
CO2: 22 mmol/L (ref 22–32)
Chloride: 104 mmol/L (ref 101–111)
Creatinine, Ser: 0.72 mg/dL (ref 0.44–1.00)
GFR calc Af Amer: >60 mL/min (ref >60)
GLUCOSE: 131 mg/dL — AB (ref 65–99)
Potassium: 3.3 mmol/L — ABNORMAL LOW (ref 3.5–5.1)
Sodium: 138 mmol/L (ref 135–145)
Total Bilirubin: 0.5 mg/dL (ref 0.3–1.2)
Total Protein: 8.3 g/dL — ABNORMAL HIGH (ref 6.5–8.1)

## 2015-05-03 LAB — CBC WITH DIFFERENTIAL/PLATELET
BASOS ABS: 0 10*3/uL (ref 0.0–0.1)
BASOS PCT: 0 % (ref 0–1)
Basophils Absolute: 0 10*3/uL (ref 0.0–0.1)
Basophils Relative: 0 % (ref 0–1)
EOS ABS: 0 10*3/uL (ref 0.0–0.7)
EOS PCT: 0 % (ref 0–5)
EOS PCT: 2 % (ref 0–5)
Eosinophils Absolute: 0.2 10*3/uL (ref 0.0–0.7)
HCT: 42.5 % (ref 36.0–46.0)
HEMATOCRIT: 39.9 % (ref 36.0–46.0)
Hemoglobin: 14.2 g/dL (ref 12.0–15.0)
Hemoglobin: 13.3 g/dL (ref 12.0–15.0)
LYMPHS ABS: 0.4 10*3/uL — AB (ref 0.7–4.0)
Lymphocytes Relative: 19 % (ref 12–46)
Lymphocytes Relative: 5 % — ABNORMAL LOW (ref 12–46)
Lymphs Abs: 1.8 10*3/uL (ref 0.7–4.0)
MCH: 25.8 pg — ABNORMAL LOW (ref 26.0–34.0)
MCH: 25.8 pg — AB (ref 26.0–34.0)
MCHC: 33.4 g/dL (ref 30.0–36.0)
MCHC: 33.3 g/dL (ref 30.0–36.0)
MCV: 77.1 fL — ABNORMAL LOW (ref 78.0–100.0)
MCV: 77.3 fL — ABNORMAL LOW (ref 78.0–100.0)
MONO ABS: 0.3 10*3/uL (ref 0.1–1.0)
Monocytes Absolute: 0.5 10*3/uL (ref 0.1–1.0)
Monocytes Relative: 3 % (ref 3–12)
Monocytes Relative: 6 % (ref 3–12)
NEUTROS ABS: 7.7 10*3/uL (ref 1.7–7.7)
NEUTROS PCT: 87 % — AB (ref 43–77)
Neutro Abs: 7.6 10*3/uL (ref 1.7–7.7)
Neutrophils Relative %: 78 % — ABNORMAL HIGH (ref 43–77)
Platelets: 279 10*3/uL (ref 150–400)
Platelets: 225 10*3/uL (ref 150–400)
RBC: 5.51 MIL/uL — AB (ref 3.87–5.11)
RBC: 5.16 MIL/uL — AB (ref 3.87–5.11)
RDW: 13.6 % (ref 11.5–15.5)
RDW: 13.6 % (ref 11.5–15.5)
WBC: 9.8 10*3/uL (ref 4.0–10.5)
WBC: 8.9 10*3/uL (ref 4.0–10.5)

## 2015-05-03 LAB — TYPE AND SCREEN
ABO/RH(D): O POS
ANTIBODY SCREEN: NEGATIVE

## 2015-05-03 LAB — HEMOGLOBIN AND HEMATOCRIT, BLOOD
HCT: 38.9 % (ref 36.0–46.0)
HCT: 32.3 % — ABNORMAL LOW (ref 36.0–46.0)
HEMATOCRIT: 32.6 % — AB (ref 36.0–46.0)
HEMATOCRIT: 35.3 % — AB (ref 36.0–46.0)
HEMOGLOBIN: 11.5 g/dL — AB (ref 12.0–15.0)
Hemoglobin: 10.6 g/dL — ABNORMAL LOW (ref 12.0–15.0)
Hemoglobin: 10.8 g/dL — ABNORMAL LOW (ref 12.0–15.0)
Hemoglobin: 12.8 g/dL (ref 12.0–15.0)

## 2015-05-03 LAB — POC OCCULT BLOOD, ED: FECAL OCCULT BLD: POSITIVE — AB

## 2015-05-03 LAB — URINALYSIS, ROUTINE W REFLEX MICROSCOPIC
Bilirubin Urine: NEGATIVE
Glucose, UA: NEGATIVE mg/dL
Ketones, ur: NEGATIVE mg/dL
Leukocytes, UA: NEGATIVE
Nitrite: NEGATIVE
PH: 5.5 (ref 5.0–8.0)
Protein, ur: 100 mg/dL — AB
Specific Gravity, Urine: 1.024 (ref 1.005–1.030)
Urobilinogen, UA: 0.2 mg/dL (ref 0.0–1.0)

## 2015-05-03 LAB — I-STAT CG4 LACTIC ACID, ED: Lactic Acid, Venous: 2.24 mmol/L (ref 0.5–2.0)

## 2015-05-03 LAB — PROTIME-INR
INR: 1.12 (ref 0.00–1.49)
PROTHROMBIN TIME: 14.6 s (ref 11.6–15.2)

## 2015-05-03 LAB — CLOSTRIDIUM DIFFICILE BY PCR: Toxigenic C. Difficile by PCR: NEGATIVE

## 2015-05-03 LAB — URINE MICROSCOPIC-ADD ON

## 2015-05-03 LAB — ABO/RH: ABO/RH(D): O POS

## 2015-05-03 LAB — MRSA PCR SCREENING: MRSA by PCR: NEGATIVE

## 2015-05-03 LAB — LIPASE, BLOOD: Lipase: 21 U/L — ABNORMAL LOW (ref 22–51)

## 2015-05-03 LAB — MAGNESIUM: Magnesium: 1.7 mg/dL (ref 1.7–2.4)

## 2015-05-03 SURGERY — EGD (ESOPHAGOGASTRODUODENOSCOPY)
Anesthesia: Moderate Sedation

## 2015-05-03 MED ORDER — IOHEXOL 300 MG/ML  SOLN
80.0000 mL | Freq: Once | INTRAMUSCULAR | Status: AC | PRN
  Administered 2015-05-03: 80 mL via INTRAVENOUS

## 2015-05-03 MED ORDER — SODIUM CHLORIDE 0.9 % IV SOLN
8.0000 mg/h | INTRAVENOUS | Status: DC
  Administered 2015-05-03: 8 mg/h via INTRAVENOUS
  Filled 2015-05-03 (×2): qty 80

## 2015-05-03 MED ORDER — ONDANSETRON 4 MG PO TBDP: 4.0000 mg | ORAL_TABLET | Freq: Once | ORAL | Status: DC

## 2015-05-03 MED ORDER — MIDAZOLAM HCL 10 MG/2ML IJ SOLN
INTRAMUSCULAR | Status: DC | PRN
  Administered 2015-05-03: 1 mg via INTRAVENOUS
  Administered 2015-05-03: 2 mg via INTRAVENOUS

## 2015-05-03 MED ORDER — MORPHINE SULFATE 2 MG/ML IJ SOLN
2.0000 mg | INTRAMUSCULAR | Status: DC | PRN
  Administered 2015-05-03 – 2015-05-05 (×3): 2 mg via INTRAVENOUS
  Filled 2015-05-03 (×3): qty 1

## 2015-05-03 MED ORDER — MORPHINE SULFATE 4 MG/ML IJ SOLN
4.0000 mg | Freq: Once | INTRAMUSCULAR | Status: AC
  Administered 2015-05-03: 4 mg via INTRAVENOUS
  Filled 2015-05-03: qty 1

## 2015-05-03 MED ORDER — ONDANSETRON HCL 4 MG PO TABS: 4.0000 mg | ORAL_TABLET | Freq: Four times a day (QID) | ORAL | Status: DC | PRN

## 2015-05-03 MED ORDER — FENTANYL CITRATE (PF) 100 MCG/2ML IJ SOLN
INTRAMUSCULAR | Status: AC
  Filled 2015-05-03: qty 2

## 2015-05-03 MED ORDER — LACTATED RINGERS IV BOLUS (SEPSIS)
1000.0000 mL | Freq: Once | INTRAVENOUS | Status: AC
  Administered 2015-05-03: 1000 mL via INTRAVENOUS

## 2015-05-03 MED ORDER — SODIUM CHLORIDE 0.9 % IV BOLUS (SEPSIS)
500.0000 mL | Freq: Once | INTRAVENOUS | Status: AC
  Administered 2015-05-03: 500 mL via INTRAVENOUS

## 2015-05-03 MED ORDER — ALUM & MAG HYDROXIDE-SIMETH 200-200-20 MG/5ML PO SUSP
30.0000 mL | Freq: Four times a day (QID) | ORAL | Status: DC | PRN
Start: 2015-05-03 — End: 2015-05-05

## 2015-05-03 MED ORDER — SODIUM CHLORIDE 0.9 % IV SOLN
80.0000 mg | Freq: Once | INTRAVENOUS | Status: AC
  Administered 2015-05-03: 80 mg via INTRAVENOUS
  Filled 2015-05-03: qty 80

## 2015-05-03 MED ORDER — DIPHENHYDRAMINE HCL 50 MG/ML IJ SOLN
INTRAMUSCULAR | Status: AC
  Filled 2015-05-03: qty 1

## 2015-05-03 MED ORDER — FENTANYL CITRATE (PF) 100 MCG/2ML IJ SOLN
INTRAMUSCULAR | Status: DC | PRN
  Administered 2015-05-03: 25 ug via INTRAVENOUS
  Administered 2015-05-03: 12.5 ug via INTRAVENOUS

## 2015-05-03 MED ORDER — ONDANSETRON HCL 4 MG/2ML IJ SOLN: 4.0000 mg | Freq: Four times a day (QID) | INTRAMUSCULAR | Status: DC | PRN

## 2015-05-03 MED ORDER — SODIUM CHLORIDE 0.9 % IV SOLN
INTRAVENOUS | Status: AC
Start: 2015-05-03 — End: 2015-05-03
  Administered 2015-05-03 (×2): via INTRAVENOUS

## 2015-05-03 MED ORDER — IOHEXOL 300 MG/ML  SOLN: 25.0000 mL | INTRAMUSCULAR | Status: DC

## 2015-05-03 MED ORDER — MIDAZOLAM HCL 5 MG/ML IJ SOLN
INTRAMUSCULAR | Status: AC
  Filled 2015-05-03: qty 2

## 2015-05-03 NOTE — Progress Notes (Signed)
[facility] PROGRESS NOTE  Erika Rogers ZOX:[id number] DOB: [birth year] DOA: 05/02/2015 PCP: No PCP Per Patient  Assessment/Plan: 1. Nausea/vomiting/diarrhea -Patient presenting with multiple episodes of nausea and vomiting over the past 48 hours prior to hospitalization. She was worked up with a CT scan of abdomen and pelvis in the emergency department which revealed diffuse colonic fluid levels with mild distention, no definitive bowel wall thickening or edema.  -Possibilities include infectious colitis, C. Diff, viral syndrome -Patient showing some improvement on this mornings evaluation -C. difficile and stool cultures are pending at the time of this dictation -Continue supportive care, IV fluid resuscitation, as needed IV antiemetic therapy  2.  Probable lower GI bleed -Patient having multiple episodes of hematochezia overnight for which GI was consulted and underwent upper endoscopy today. This study was unremarkable -IV Protonix was discontinued -Hemoglobin is stable at 11.5 -May undergo colonoscopy  3.  Hypotension -Patient having a blood pressure of 89/76 in the emergency room likely secondary to profound hypovolemia secondary to GI loss from intractable nausea vomiting and diarrhea -Blood pressures improving with IV fluid resuscitation -Will continue running normal saline at 150 mL/hour -She is currently hemodynamically stable, mentating well, nontoxic, nonfocal  4.  DVT prophylaxis -SCDs   Code Status: Full code Family Communication: I spoke with her husband was present at bedside Disposition Plan: Discharge home when medically stable   Consultants:  GI  Procedures:  EGD performed 05/03/2015 impression: Remarkable EGD  Antibiotics:  HPI/Subjective: Patient is a pleasant 44 year old female admitted to the medicine service on 05/03/2015 when she presented with abdominal pain associated with intractable nausea and vomiting along with diarrhea over the past 2 days  prior to hospitalization. She was noted to have bright red blood per rectum in the emergency department. Patient also became hypotensive and tachycardic. Given concerns for hemodynamic stability she was admitted to the stepdown unit and given aggressive IV fluid resuscitation. Initial labs showed a hemoglobin of 12.8 which trended down to 11.5 with the following day. GI was consulted as she underwent upper endoscopy on 05/03/2015. This study was unremarkable.  Objective: Filed Vitals:   05/03/15 1155  BP: 93/67  Pulse: 108  Temp: 98.7 F (37.1 C)  Resp: 20    Intake/Output Summary (Last 24 hours) at 05/03/15 1228 Last data filed at 05/03/15 1200  Gross per 24 hour  Intake 4172.08 ml  Output    350 ml  Net 3822.08 ml   Filed Weights   05/03/15 0445 05/03/15 0519  Weight: 57.9 kg (127 lb 10.3 oz) 57.9 kg (127 lb 10.3 oz)    Exam:   General:  Patient is awake and alert, nontoxic appearing, pleasant cooperative  Cardiovascular: Tachycardic normal S1-S2 no murmurs rubs or gallops  Respiratory: Normal respiratory effort, lungs are clear to auscultation bilaterally  Abdomen: She has generalized tenderness to palpation across abdomen, no rebound tenderness or guarding  Musculoskeletal: No edema  Data Reviewed: Basic Metabolic Panel:  Recent Labs Lab 05/02/15 2301 05/03/15 0613  NA 138 136  K 3.3* 4.0  CL 104 109  CO2 22 18*  GLUCOSE 131* 111*  BUN 15 10  CREATININE 0.72 0.68  CALCIUM 9.4 7.9*  MG 1.7  --    Liver Function Tests:  Recent Labs Lab 05/02/15 2301  AST 21  ALT 14  ALKPHOS 78  BILITOT 0.5  PROT 8.3*  ALBUMIN 4.3    Recent Labs Lab 05/02/15 2301  LIPASE 21*   No results for input(s): AMMONIA in the  last 168 hours. CBC:  Recent Labs Lab 05/02/15 2301 05/03/15 0315 05/03/15 0613 05/03/15 1125  WBC 9.8 8.9  --   --   NEUTROABS 7.6 7.7  --   --   HGB 14.2 13.3 12.8 11.5*  HCT 42.5 39.9 38.9 35.3*  MCV 77.1* 77.3*  --   --   PLT 279  225  --   --    Cardiac Enzymes: No results for input(s): CKTOTAL, CKMB, CKMBINDEX, TROPONINI in the last 168 hours. BNP (last 3 results) No results for input(s): BNP in the last 8760 hours.  ProBNP (last 3 results) No results for input(s): PROBNP in the last 8760 hours.  CBG: No results for input(s): GLUCAP in the last 168 hours.  Recent Results (from the past 240 hour(s))  MRSA PCR Screening     Status: None   Collection Time: 05/03/15  5:25 AM  Result Value Ref Range Status   MRSA by PCR NEGATIVE NEGATIVE Final    Comment:        The GeneXpert MRSA Assay (FDA approved for NASAL specimens only), is one component of a comprehensive MRSA colonization surveillance program. It is not intended to diagnose MRSA infection nor to guide or monitor treatment for MRSA infections.      Studies: Ct Abdomen Pelvis W Contrast  05/03/2015   CLINICAL DATA:  Bloody diarrhea.  EXAM: CT ABDOMEN AND PELVIS WITH CONTRAST  TECHNIQUE: Multidetector CT imaging of the abdomen and pelvis was performed using the standard protocol following bolus administration of intravenous contrast.  CONTRAST:  80mL OMNIPAQUE IOHEXOL 300 MG/ML  SOLN  COMPARISON:  None.  FINDINGS: BODY WALL: No contributory findings.  LOWER CHEST: No contributory findings.  ABDOMEN/PELVIS:  Liver: No focal abnormality.  Biliary: No evidence of biliary obstruction or stone.  Pancreas: Unremarkable.  Spleen: Unremarkable.  Adrenals: Unremarkable.  Kidneys and ureters: No hydronephrosis or stone. Sub cm low-density in the left kidney is a presumed cyst.  Bladder: Unremarkable.  Reproductive: No pathologic findings.  Corpus luteum on the left.  Bowel: Diffuse colonic fluid levels withmild distension. There is no definitive bowel wall thickening and no edema around the bowel. Appendectomy. No bowel obstruction.  Retroperitoneum: No mass or adenopathy.  Peritoneum: No ascites or pneumoperitoneum.  Vascular: No acute abnormality. Noncalcified  atherosclerotic plaque noted along the posterior wall of the upper abdominal aorta.  OSSEOUS: No acute abnormalities.  IMPRESSION: Diarrheal illness, usually reflecting an infectious enterocolitis.   Electronically Signed   By: Marnee SpringJonathon  Watts M.D.   On: 05/03/2015 04:19   Dg Abd Acute W/chest  05/03/2015   CLINICAL DATA:  Evaluate for small bowel obstruction. Nausea, vomiting, diarrhea.  EXAM: DG ABDOMEN ACUTE W/ 1V CHEST  COMPARISON:  None.  FINDINGS: Proximal colonic fluid levels, nonspecific but potentially related to history of diarrhea. No evidence of bowel obstruction or perforation. No concerning intra-abdominal mass effect or calcification.  Normal heart size and mediastinal contours. No acute infiltrate or edema. No effusion or pneumothorax. No acute osseous findings.  IMPRESSION: Negative abdominal radiographs.  No acute cardiopulmonary disease.   Electronically Signed   By: Marnee SpringJonathon  Watts M.D.   On: 05/03/2015 01:04    Scheduled Meds:  Continuous Infusions: . sodium chloride 150 mL/hr at 05/03/15 0543    Principal Problem:   Hypotension due to blood loss Active Problems:   GERD   BRBPR (bright red blood per rectum)   Active bleeding   Abdominal pain, epigastric   Vomiting   GIB (gastrointestinal  bleeding)   Hematochezia   Nausea vomiting and diarrhea    Time spent:     Jeralyn Bennett  [facility] Pager [phone]. If 7PM-7AM, please contact night-coverage at [email or web address], password [facility] 05/03/2015, 12:28 PM  LOS: 0 days

## 2015-05-03 NOTE — H&P (Signed)
PCP:   No PCP Per Patient   Chief Complaint:  N/v/d/epigastric pain  HPI: 44 yo female h/o gerd comes in with several bouts of nonbloody emesis and diarrhea with associated epigastric abdominal pain for several hours today.  No fevers.  In the ED she then developed brbpr has had 4 bm loose and bloody.  Not vomiting blood or vomiting at all since arrival.  Is getting 3 rd liter ivf, pt has become more tachycardic and hypotension in the ED.  protonix gtt is ordered.  Ct abd /pelvis pending AAS shows no perforation.  GI called and will see soon.  Pt has no h/o GIB in past.  hgb nml initially.  No h/o ulcers, no h/o previous gib.  No fhx of gi cancers.  Denies any chronic previous episodes of abdominal pain before today.  No sick contacts with similar illness.  Review of Systems:  Positive and negative as per HPI otherwise all other systems are negative  Past Medical History: Past Medical History  Diagnosis Date  . Allergy   . Depression    Past Surgical History  Procedure Laterality Date  . Tonsillectomy    . Appendectomy      Medications: Prior to Admission medications   Medication Sig Start Date End Date Taking? Authorizing Provider  albuterol (PROVENTIL HFA;VENTOLIN HFA) 108 (90 BASE) MCG/ACT inhaler Inhale 2 puffs into the lungs every 6 (six) hours. For two days, and then as needed for cough/shortness of breath. 04/24/13  Yes Godfrey Pick, PA-C    Allergies:   Allergies  Allergen Reactions  . Amoxicillin Other (See Comments)    unknown    Social History:  reports that she has never smoked. She does not have any smokeless tobacco history on file. She reports that she does not drink alcohol or use illicit drugs.  Family History: No gi illnesses  Physical Exam: Filed Vitals:   05/03/15 0245 05/03/15 0300 05/03/15 0303 05/03/15 0315  BP:   Pulse: 113 114 122 110  Temp:      TempSrc:      Resp:      SpO2: 100% 100% 100% 99%   General appearance:  alert, cooperative and no distress Head: Normocephalic, without obvious abnormality, atraumatic Eyes: negative Nose: Nares normal. Septum midline. Mucosa normal. No drainage or sinus tenderness. Neck: no JVD and supple, symmetrical, trachea midline Lungs: clear to auscultation bilaterally Heart: regular rate and rhythm, S1, S2 normal, no murmur, click, rub or gallop Abdomen: soft, non-tender; bowel sounds normal; no masses,  no organomegaly Extremities: extremities normal, atraumatic, no cyanosis or edema Pulses: 2+ and symmetric Skin: Skin color, texture, turgor normal. No rashes or lesions Neurologic: Grossly normal   Labs on Admission:   Recent Labs  05/02/15 2301  NA 138  K 3.3*  CL 104  CO2 22  GLUCOSE 131*  BUN 15  CREATININE 0.72  CALCIUM 9.4  MG 1.7    Recent Labs  05/02/15 2301  AST 21  ALT 14  ALKPHOS 78  BILITOT 0.5  PROT 8.3*  ALBUMIN 4.3    Recent Labs  05/02/15 2301  LIPASE 21*    Recent Labs  05/02/15 2301 05/03/15 0315  WBC 9.8 8.9  NEUTROABS 7.6 7.7  HGB 14.2 13.3  HCT 42.5 39.9  MCV 77.1* 77.3*  PLT 279 225   Radiological Exams on Admission: Dg Abd Acute W/chest  05/03/2015   CLINICAL DATA:  Evaluate for small bowel obstruction. Nausea, vomiting, diarrhea.  EXAM: DG ABDOMEN ACUTE W/ 1V CHEST  COMPARISON:  None.  FINDINGS: Proximal colonic fluid levels, nonspecific but potentially related to history of diarrhea. No evidence of bowel obstruction or perforation. No concerning intra-abdominal mass effect or calcification.  Normal heart size and mediastinal contours. No acute infiltrate or edema. No effusion or pneumothorax. No acute osseous findings.  IMPRESSION: Negative abdominal radiographs.  No acute cardiopulmonary disease.   Electronically Signed   By: Marnee SpringJonathon  Watts M.D.   On: 05/03/2015 01:04   AAS reviewed by myself Old records reviewed also  Assessment/Plan  [age] yo female with n/v, epigastric pain with overt bleeding brbpr    Principal Problem:   Hypotension due to blood loss/volume loss-  Probably from UGIB but unclear.  Place on protonix drip.  Place in stepdown unit to monitor for any further hemodynamic deterioration.  Will bolus another liter of LR.  Repeat hgb over 13 from 14.2.  Keep npo for scoping in the next couple of hours. Serial h/h q 6 hours.  Should stabalize after 5 liters ivf bolus, if not and continues to be hypotensive significantly consider transfusing prbc.    Active Problems:   GERD-  noted   BRBPR (bright red blood per rectum)- noted, stool cx and cdiff has been sent, suspect ugi source.   Active bleeding-  As above   Abdominal pain, epigastric- as above   Vomiting-  noted  Admit to stepdown.  Continue to bolus as above.  FULL CODE.  Erika Rogers,Erika Rogers 05/03/2015, 3:40 AM

## 2015-05-03 NOTE — Consult Note (Signed)
Consultation  Referring Provider: Vanessa Barbara, [facility]     Primary Care Physician:  No PCP Per Patient Primary Gastroenterologist:  None  Reason for Consultation:  N/v/abd pain, hematochezia  HPI: Erika Rogers is a 44 y.o. female with little past medical history who presented to the ER with acute onset nausea, vomiting, diarrhea and epigastric abdominal pain. Started around 8 PM last night after dinner. Throughout multiple times at home and was having diarrhea at the same time. Came to the ER and once she arrived she had 3-4 loose bloody bowel movements. This was associated with tachycardia and hypotension. She was bolused with 4 L normal saline. CT scan was performed in the ER and showed fluid levels throughout the colon consistent with diarrhea. No pancreatitis, bowel wall thickening, or evidence of abdominal catastrophe such as perforation. After arriving to the floor she has had 2 additional episodes of diarrhea which contain blood. Still with some upper abdominal discomfort but not as bad. She has not had any further vomiting since being here.  No prior GI history. Denies family history of GI malignancy, inflammatory bowel disease or peptic ulcer disease  Of note her brother with whom she's had contact also has a diarrheal illness which started yesterday.   Past Medical History  Diagnosis Date  . Allergy   . Depression     Past Surgical History  Procedure Laterality Date  . Tonsillectomy    . Appendectomy      Prior to Admission medications   Medication Sig Start Date End Date Taking? Authorizing Provider  albuterol (PROVENTIL HFA;VENTOLIN HFA) 108 (90 BASE) MCG/ACT inhaler Inhale 2 puffs into the lungs every 6 (six) hours. For two days, and then as needed for cough/shortness of breath. 04/24/13  Yes Godfrey Pick, PA-C    Current Facility-Administered Medications  Medication Dose Route Frequency Provider Last Rate Last Dose  . 0.9 %  sodium chloride infusion    Intravenous Continuous Haydee Monica, MD 150 mL/hr at 05/03/15 0543    . alum & mag hydroxide-simeth (MAALOX/MYLANTA) 200-200-20 MG/5ML suspension 30 mL  30 mL Oral Q6H PRN Haydee Monica, MD      . morphine 2 MG/ML injection 2 mg  2 mg Intravenous Q2H PRN Haydee Monica, MD      . ondansetron (ZOFRAN) tablet 4 mg  4 mg Oral Q6H PRN Haydee Monica, MD       Or  . ondansetron (ZOFRAN) injection 4 mg  4 mg Intravenous Q6H PRN Haydee Monica, MD      . pantoprazole (PROTONIX) 80 mg in sodium chloride 0.9 % 250 mL (0.32 mg/mL) infusion  8 mg/hr Intravenous Continuous Ankit Nanavati, MD 25 mL/hr at 05/03/15 0403 8 mg/hr at 05/03/15 0403    Allergies as of 05/02/2015 - Review Complete 05/02/2015  Allergen Reaction Noted  . Amoxicillin Other (See Comments)     No family history on file.  History   Social History  . Marital Status: Married    Spouse Name: N/A  . Number of Children: N/A  . Years of Education: N/A   Occupational History  . Not on file.   Social History Main Topics  . Smoking status: Never Smoker   . Smokeless tobacco: Not on file  . Alcohol Use: No  . Drug Use: No  . Sexual Activity: Not on file   Other Topics Concern  . Not on file   Social History Narrative   Single. Education: Grade School. Exercise:  walks 2 times a week for 2 hours.    Review of Systems: As per HPI, otherwise negative  Physical Exam: Vital signs in last 24 hours: Temp:  [98.1 F (36.7 C)-98.6 F (37 C)] 98.6 F (37 C) (05/29 0747) Pulse Rate:  [93-122] 107 (05/29 0519) Resp:  [20-28] 25 (05/29 0430) BP: (89-136)/(57-85) 107/67 mmHg (05/29 0519) SpO2:  [97 %-100 %] 100 % (05/29 0519) Weight:  [127 lb 10.3 oz (57.9 kg)] 127 lb 10.3 oz (57.9 kg) (05/29 0519) Last BM Date: 05/03/15 General:   Alert,  Well-developed, well-nourished, pleasant and cooperative in NAD Head:  Normocephalic and atraumatic. Eyes:  Sclera clear, no icterus.   Conjunctiva pink. Ears:  Normal auditory  acuity. Nose:  No deformity, discharge,  or lesions. Mouth:  No deformity or lesions.   Neck:  Supple; no masses or thyromegaly. Lungs:  Clear throughout to auscultation.   No wheezes, crackles, or rhonchi.  Heart:  Tachy, Regular rhythm; no murmurs, clicks, rubs,  or gallops. Abdomen:  Soft, upper abdominal tenderness without rebound or guarding, BS active   Rectal:  Deferred  Msk:  Symmetrical without gross deformities. . Pulses:  Normal pulses noted. Extremities:  Without clubbing or edema. Neurologic:  Alert and  oriented x4;  grossly normal neurologically. Skin:  Intact without significant lesions or rashes.. Psych:  Alert and cooperative. Normal mood and affect.  Intake/Output from previous day: 05/28 0701 - 05/29 0700 In: 3150 [I.V.:2150; IV Piggyback:1000] Out: -  Intake/Output this shift:    Lab Results:  Recent Labs  05/02/15 2301 05/03/15 0315 05/03/15 0613  WBC 9.8 8.9  --   HGB 14.2 13.3 12.8  HCT 42.5 39.9 38.9  PLT 279 225  --    BMET  Recent Labs  05/02/15 2301 05/03/15 0613  NA 138 136  K 3.3* 4.0  CL 104 109  CO2 22 18*  GLUCOSE 131* 111*  BUN 15 10  CREATININE 0.72 0.68  CALCIUM 9.4 7.9*   LFT  Recent Labs  05/02/15 2301  PROT 8.3*  ALBUMIN 4.3  AST 21  ALT 14  ALKPHOS 78  BILITOT 0.5   PT/INR  Recent Labs  05/03/15 0613  LABPROT 14.6  INR 1.12     Studies/Results: CT-abd pelvis with contrast: BODY WALL: No contributory findings.  LOWER CHEST: No contributory findings.  ABDOMEN/PELVIS:  Liver: No focal abnormality.  Biliary: No evidence of biliary obstruction or stone.  Pancreas: Unremarkable.  Spleen: Unremarkable.  Adrenals: Unremarkable.  Kidneys and ureters: No hydronephrosis or stone. Sub cm low-density in the left kidney is a presumed cyst.  Bladder: Unremarkable.  Reproductive: No pathologic findings. Corpus luteum on the left.  Bowel: Diffuse colonic fluid levels withmild distension.  There is no definitive bowel wall thickening and no edema around the bowel. Appendectomy. No bowel obstruction.  Retroperitoneum: No mass or adenopathy.  Peritoneum: No ascites or pneumoperitoneum.  Vascular: No acute abnormality. Noncalcified atherosclerotic plaque noted along the posterior wall of the upper abdominal aorta.  OSSEOUS: No acute abnormalities.  IMPRESSION: Diarrheal illness, usually reflecting an infectious enterocolitis.    IMPRESSION:  44 year old female with little past medical history presenting to the ER with epigastric abdominal pain, nausea vomiting and diarrhea starting acutely yesterday followed by 4-5 episodes of large-volume hematochezia.  PLAN: Presumed infectious etiology for nausea, vomiting and diarrhea. GI pathogen panel added today to already in process C. difficile PCR (doubt C. Difficile).  Likely either viral or bacterial/toxin mediated gastroenteritis (especially with another family  member sick). Concern with bleeding is possible brisk upper source in the setting of upper abdominal pain, nausea and vomiting, rule out Mallory-Weiss tear.  Vomiting at home was non-bloody, but hematochezia started once in the ER. PPI drip started. Hemoglobin has dropped but remains normal. She remains tachycardic I have ordered an additional bolus of normal saline --EGD this morning. The nature of the procedure, as well as the risks, benefits, and alternatives were carefully and thoroughly reviewed with the patient. Ample time for discussion and questions allowed. The patient understood, was satisfied, and agreed to proceed.    Beverley Fiedler  05/03/2015, 8:31 AM

## 2015-05-03 NOTE — Op Note (Signed)
[facility] [address] [place], [id number]   ENDOSCOPY PROCEDURE REPORT  Erika Rogers, Erika Rogers  MR#: [id number] BIRTHDATE: Jul 08, 1971 , 44  yrs. old GENDER: female ENDOSCOPIST: Beverley FiedlerJay M Pyrtle, MD REFERRED BY:  [facility] PROCEDURE DATE:  05/03/2015 PROCEDURE:  EGD, diagnostic ASA CLASS:     Class II INDICATIONS:  nausea, vomiting, unexplained diarrhea, and hematochezia. MEDICATIONS: Fentanyl 37.5 mcg IV and Versed 3 mg IV TOPICAL ANESTHETIC: Cetacaine Spray  DESCRIPTION OF PROCEDURE: After the risks benefits and alternatives of the procedure were thoroughly explained, informed consent was obtained.  The PENTAX GASTROSCOPE [id number] endoscope was introduced through the mouth and advanced to the second portion of the duodenum , Without limitations.  The instrument was slowly withdrawn as the mucosa was fully examined.      EXAM: The esophagus and gastroesophageal junction were completely normal in appearance.  The stomach was entered and closely examined.The antrum, angularis, and lesser curvature were well visualized, including a retroflexed view of the cardia and fundus. The stomach wall was normally distensible.  The scope passed easily through the pylorus into the duodenum.  Retroflexed views revealed no abnormalities.     The scope was then withdrawn from the patient and the procedure completed.  COMPLICATIONS: There were no immediate complications.  ENDOSCOPIC IMPRESSION: Normal upper endoscopy  RECOMMENDATIONS: 1.  Likely lower GI source of bleeding.  Exclude infectious source, and if negative then colonoscopy is recommended. 2.  Trend Hgb  eSigned:  Beverley FiedlerJay M Pyrtle, MD 05/03/2015 10:35 AM    CC: the patient

## 2015-05-03 NOTE — ED Notes (Signed)
Patient transported to CT 

## 2015-05-04 DIAGNOSIS — E860 Dehydration: Secondary | ICD-10-CM

## 2015-05-04 DIAGNOSIS — K625 Hemorrhage of anus and rectum: Secondary | ICD-10-CM

## 2015-05-04 LAB — CBC
HCT: 33.9 % — ABNORMAL LOW (ref 36.0–46.0)
Hemoglobin: 11.3 g/dL — ABNORMAL LOW (ref 12.0–15.0)
MCH: 25.8 pg — AB (ref 26.0–34.0)
MCHC: 33.3 g/dL (ref 30.0–36.0)
MCV: 77.4 fL — ABNORMAL LOW (ref 78.0–100.0)
Platelets: 213 10*3/uL (ref 150–400)
RBC: 4.38 MIL/uL (ref 3.87–5.11)
RDW: 14.1 % (ref 11.5–15.5)
WBC: 7.7 10*3/uL (ref 4.0–10.5)

## 2015-05-04 LAB — BASIC METABOLIC PANEL
Anion gap: 6 (ref 5–15)
CO2: 20 mmol/L — ABNORMAL LOW (ref 22–32)
CREATININE: 0.57 mg/dL (ref 0.44–1.00)
Calcium: 7.8 mg/dL — ABNORMAL LOW (ref 8.9–10.3)
Chloride: 108 mmol/L (ref 101–111)
GFR calc non Af Amer: >60 mL/min (ref >60)
GLUCOSE: 96 mg/dL (ref 65–99)
Potassium: 3.5 mmol/L (ref 3.5–5.1)
Sodium: 134 mmol/L — ABNORMAL LOW (ref 135–145)

## 2015-05-04 MED ORDER — BENZONATATE 100 MG PO CAPS
100.0000 mg | ORAL_CAPSULE | Freq: Three times a day (TID) | ORAL | Status: DC | PRN
  Administered 2015-05-04: 100 mg via ORAL
  Filled 2015-05-04 (×3): qty 1

## 2015-05-04 MED ORDER — PSEUDOEPHEDRINE HCL 30 MG PO TABS
30.0000 mg | ORAL_TABLET | Freq: Four times a day (QID) | ORAL | Status: DC | PRN
  Administered 2015-05-04: 30 mg via ORAL
  Filled 2015-05-04 (×2): qty 1

## 2015-05-04 NOTE — Progress Notes (Signed)
    Progress Note   Subjective  feels better. No abdominal pain. No nausea. Passed some blood in stool last night x2 but no BMs or blood today   Objective   Vital signs in last 24 hours: Temp:  [98.4 F (36.9 C)-99.4 F (37.4 C)] 98.5 F (36.9 C) (05/30 0715) Pulse Rate:  [90-108] 90 (05/30 0445) Resp:  [20-27] 22 (05/30 0445) BP: (93-121)/(64-79) 110/67 mmHg (05/30 0445) SpO2:  [97 %-100 %] 98 % (05/30 0445) Last BM Date: 05/03/15 General:    Pleasant Asian female in NAD Heart:  Regular rate and rhythm; no murmurs Lungs: Respirations even and unlabored, lungs CTA bilaterally Abdomen:  Soft, nontender and nondistended. Normal bowel sounds. Extremities:  Without edema. Neurologic:  Alert and oriented,  grossly normal neurologically. Psych:  Cooperative. Normal mood and affect.  Lab Results:  Recent Labs  05/02/15 2301 05/03/15 0315  05/03/15 1735 05/03/15 2330 05/04/15 0608  WBC 9.8 8.9  --   --   --  7.7  HGB 14.2 13.3  < > 10.8* 10.6* 11.3*  HCT 42.5 39.9  < > 32.3* 32.6* 33.9*  PLT 279 225  --   --   --  213  < > = values in this interval not displayed. BMET  Recent Labs  05/02/15 2301 05/03/15 0613 05/04/15 0608  NA 138 136 134*  K 3.3* 4.0 3.5  CL 104 109 108  CO2 22 18* 20*  GLUCOSE 131* 111* 96  BUN 15 10 <5*  CREATININE 0.72 0.68 0.57  CALCIUM 9.4 7.9* 7.8*   LFT  Recent Labs  05/02/15 2301  PROT 8.3*  ALBUMIN 4.3  AST 21  ALT 14  ALKPHOS 78  BILITOT 0.5   PT/INR  Recent Labs  05/03/15 0613  LABPROT 14.6  INR 1.12     Assessment / Plan:    44 year old female with epigastric pain, nausea, vomiting and diarrhea followed by large volume hematochezia. CTscan suggested diarrheal illness with fluid in colon, no other abnormalities. EGD yesterday was normal. Suspect infectious etiology. Stool path panel pending. C-diff is negative. Her hgb is stable, actually up today (11.3).Will advance to full liquids. Hopefully home tomorrow.     LOS: 1 day   Willette Clusteraula Sahory Nordling  05/04/2015, 11:08 AM

## 2015-05-04 NOTE — Progress Notes (Signed)
[facility] PROGRESS NOTE  Scharlene Hada ZOX:[id number] DOB: 02/15/1971 DOA: 05/02/2015 PCP: No PCP Per Patient  Assessment/Plan: 1. Nausea/vomiting/diarrhea -Patient presenting with multiple episodes of nausea and vomiting over the past 48 hours prior to hospitalization. She was worked up with a CT scan of abdomen and pelvis in the emergency department which revealed diffuse colonic fluid levels with mild distention, no definitive bowel wall thickening or edema.  -Could be secondary to viral syndrome.  -EGD performed on 05/03/2015 unremarkable  -Symptoms improved  2.  Probable lower GI bleed -Patient having multiple episodes of hematochezia overnight for which GI was consulted and underwent upper endoscopy today. This study was unremarkable -IV Protonix was discontinued -Hemoglobin remains stable, with am labs showing Hg of 11.3 -GI recommending colonoscopy -She has not had further episodes of blood stools  3.  Hypotension -Patient having a blood pressure of 89/76 in the emergency room likely secondary to profound hypovolemia secondary to GI loss from intractable nausea vomiting and diarrhea -Blood pressures improving with IV fluid resuscitation -Her blood pressures have stabilized  4.  DVT prophylaxis -SCDs   Code Status: Full code Family Communication: I spoke with her husband was present at bedside Disposition Plan: Will transfer to Med/Surg today   Consultants:  GI  Procedures:  EGD performed 05/03/2015 impression: Remarkable EGD  Antibiotics:  HPI/Subjective: Patient is a pleasant 44 year old female admitted to the medicine service on 05/03/2015 when she presented with abdominal pain associated with intractable nausea and vomiting along with diarrhea over the past 2 days prior to hospitalization. She was noted to have bright red blood per rectum in the emergency department. Patient also became hypotensive and tachycardic. Given concerns for hemodynamic stability she  was admitted to the stepdown unit and given aggressive IV fluid resuscitation. Initial labs showed a hemoglobin of 12.8 which trended down to 11.5 with the following day. GI was consulted as she underwent upper endoscopy on 05/03/2015. This study was unremarkable.  Objective: Filed Vitals:   05/04/15 0445  BP: 110/67  Pulse: 90  Temp: 98.4 F (36.9 C)  Resp: 22    Intake/Output Summary (Last 24 hours) at 05/04/15 0741 Last data filed at 05/04/15 0522  Gross per 24 hour  Intake 2642.08 ml  Output   2025 ml  Net 617.08 ml   Filed Weights   05/03/15 0445 05/03/15 0519  Weight: 57.9 kg (127 lb 10.3 oz) 57.9 kg (127 lb 10.3 oz)    Exam:   General:  Patient is awake and alert, nontoxic appearing, pleasant cooperative  Cardiovascular: normal S1-S2 no murmurs rubs or gallops  Respiratory: Normal respiratory effort, lungs are clear to auscultation bilaterally  Abdomen: Improved abdominal exam, overall having benign exam, Soft, nontender nondistended  Musculoskeletal: No edema  Data Reviewed: Basic Metabolic Panel:  Recent Labs Lab 05/02/15 2301 05/03/15 0613  NA 138 136  K 3.3* 4.0  CL 104 109  CO2 22 18*  GLUCOSE 131* 111*  BUN 15 10  CREATININE 0.72 0.68  CALCIUM 9.4 7.9*  MG 1.7  --    Liver Function Tests:  Recent Labs Lab 05/02/15 2301  AST 21  ALT 14  ALKPHOS 78  BILITOT 0.5  PROT 8.3*  ALBUMIN 4.3    Recent Labs Lab 05/02/15 2301  LIPASE 21*   No results for input(s): AMMONIA in the last 168 hours. CBC:  Recent Labs Lab 05/02/15 2301 05/03/15 0315 05/03/15 [id number] 05/03/15 1125 05/03/15 1735 05/03/15 2330 05/04/15 0608  WBC 9.8 8.9  --   --   --   --  7.7  NEUTROABS 7.6 7.7  --   --   --   --   --   HGB 14.2 13.3 12.8 11.5* 10.8* 10.6* 11.3*  HCT 42.5 39.9 38.9 35.3* 32.3* 32.6* 33.9*  MCV 77.1* 77.3*  --   --   --   --  77.4*  PLT 279 225  --   --   --   --  213   Cardiac Enzymes: No results for input(s): CKTOTAL, CKMB,  CKMBINDEX, TROPONINI in the last 168 hours. BNP (last 3 results) No results for input(s): BNP in the last 8760 hours.  ProBNP (last 3 results) No results for input(s): PROBNP in the last 8760 hours.  CBG: No results for input(s): GLUCAP in the last 168 hours.  Recent Results (from the past 240 hour(s))  Clostridium Difficile by PCR     Status: None   Collection Time: 05/03/15  3:18 AM  Result Value Ref Range Status   C difficile by pcr NEGATIVE NEGATIVE Final  MRSA PCR Screening     Status: None   Collection Time: 05/03/15  5:25 AM  Result Value Ref Range Status   MRSA by PCR NEGATIVE NEGATIVE Final    Comment:        The GeneXpert MRSA Assay (FDA approved for NASAL specimens only), is one component of a comprehensive MRSA colonization surveillance program. It is not intended to diagnose MRSA infection nor to guide or monitor treatment for MRSA infections.      Studies: Ct Abdomen Pelvis W Contrast  05/03/2015   CLINICAL DATA:  Bloody diarrhea.  EXAM: CT ABDOMEN AND PELVIS WITH CONTRAST  TECHNIQUE: Multidetector CT imaging of the abdomen and pelvis was performed using the standard protocol following bolus administration of intravenous contrast.  CONTRAST:  80mL OMNIPAQUE IOHEXOL 300 MG/ML  SOLN  COMPARISON:  None.  FINDINGS: BODY WALL: No contributory findings.  LOWER CHEST: No contributory findings.  ABDOMEN/PELVIS:  Liver: No focal abnormality.  Biliary: No evidence of biliary obstruction or stone.  Pancreas: Unremarkable.  Spleen: Unremarkable.  Adrenals: Unremarkable.  Kidneys and ureters: No hydronephrosis or stone. Sub cm low-density in the left kidney is a presumed cyst.  Bladder: Unremarkable.  Reproductive: No pathologic findings.  Corpus luteum on the left.  Bowel: Diffuse colonic fluid levels withmild distension. There is no definitive bowel wall thickening and no edema around the bowel. Appendectomy. No bowel obstruction.  Retroperitoneum: No mass or adenopathy.   Peritoneum: No ascites or pneumoperitoneum.  Vascular: No acute abnormality. Noncalcified atherosclerotic plaque noted along the posterior wall of the upper abdominal aorta.  OSSEOUS: No acute abnormalities.  IMPRESSION: Diarrheal illness, usually reflecting an infectious enterocolitis.   Electronically Signed   By: Marnee Spring M.D.   On: 05/03/2015 04:19   Dg Abd Acute W/chest  05/03/2015   CLINICAL DATA:  Evaluate for small bowel obstruction. Nausea, vomiting, diarrhea.  EXAM: DG ABDOMEN ACUTE W/ 1V CHEST  COMPARISON:  None.  FINDINGS: Proximal colonic fluid levels, nonspecific but potentially related to history of diarrhea. No evidence of bowel obstruction or perforation. No concerning intra-abdominal mass effect or calcification.  Normal heart size and mediastinal contours. No acute infiltrate or edema. No effusion or pneumothorax. No acute osseous findings.  IMPRESSION: Negative abdominal radiographs.  No acute cardiopulmonary disease.   Electronically Signed   By: Marnee Spring M.D.   On: 05/03/2015 01:04    Scheduled Meds:  Continuous Infusions:    Principal Problem:   Hypotension due to blood  loss Active Problems:   GERD   BRBPR (bright red blood per rectum)   Active bleeding   Abdominal pain, epigastric   Vomiting   GIB (gastrointestinal bleeding)   Hematochezia   Nausea vomiting and diarrhea    Time spent: 25 min    Jeralyn Bennett  [facility] Pager [phone]. If 7PM-7AM, please contact night-coverage at [email or web address], password [facility] 05/04/2015, 7:41 AM  LOS: 1 day

## 2015-05-05 ENCOUNTER — Encounter: Payer: Self-pay | Admitting: Nurse Practitioner

## 2015-05-05 ENCOUNTER — Encounter (HOSPITAL_COMMUNITY): Payer: Self-pay | Admitting: Internal Medicine

## 2015-05-05 DIAGNOSIS — Z789 Other specified health status: Secondary | ICD-10-CM

## 2015-05-05 DIAGNOSIS — A09 Infectious gastroenteritis and colitis, unspecified: Principal | ICD-10-CM

## 2015-05-05 LAB — CBC
HEMATOCRIT: 37.3 % (ref 36.0–46.0)
Hemoglobin: 12.4 g/dL (ref 12.0–15.0)
MCH: 25.6 pg — ABNORMAL LOW (ref 26.0–34.0)
MCHC: 33.2 g/dL (ref 30.0–36.0)
MCV: 76.9 fL — AB (ref 78.0–100.0)
Platelets: 229 10*3/uL (ref 150–400)
RBC: 4.85 MIL/uL (ref 3.87–5.11)
RDW: 13.8 % (ref 11.5–15.5)
WBC: 6.2 10*3/uL (ref 4.0–10.5)

## 2015-05-05 LAB — POCT PREGNANCY, URINE: PREG TEST UR: NEGATIVE

## 2015-05-05 LAB — BASIC METABOLIC PANEL
Anion gap: 7 (ref 5–15)
BUN: <5 mg/dL — ABNORMAL LOW (ref 6–20)
CALCIUM: 8.4 mg/dL — AB (ref 8.9–10.3)
CO2: 23 mmol/L (ref 22–32)
CREATININE: 0.55 mg/dL (ref 0.44–1.00)
Chloride: 105 mmol/L (ref 101–111)
GLUCOSE: 96 mg/dL (ref 65–99)
POTASSIUM: 3.8 mmol/L (ref 3.5–5.1)
Sodium: 135 mmol/L (ref 135–145)

## 2015-05-05 MED ORDER — TRAMADOL HCL 50 MG PO TABS: 50.0000 mg | ORAL_TABLET | Freq: Four times a day (QID) | ORAL | Status: DC | PRN

## 2015-05-05 NOTE — Progress Notes (Signed)
Pt states that she verbally understands DC instructions by reading the AVS

## 2015-05-05 NOTE — Care Management Note (Signed)
Case Management Note  Patient Details  Name: Karna DupesHne Bromell MRN: [id number] Date of Birth: 05/24/1971  Subjective/Objective:                    Action/Plan: UR completed.  Expected Discharge Date:     05-08-15             Expected Discharge Plan:  Home/Self Care  In-House Referral:     Discharge planning Services     Post Acute Care Choice:    Choice offered to:     DME Arranged:    DME Agency:     HH Arranged:    HH Agency:     Status of Service:  In process, will continue to follow  Medicare Important Message Given:    Date Medicare IM Given:    Medicare IM give by:    Date Additional Medicare IM Given:    Additional Medicare Important Message give by:     If discussed at Long Length of Stay Meetings, dates discussed:    Additional Comments:  Kingsley PlanWile, Iolanda Folson Marie, RN 05/05/2015, 11:55 AM

## 2015-05-05 NOTE — Discharge Summary (Signed)
Physician Discharge Summary  Erika Rogers JWJ:[id number] DOB: 1971/07/15 DOA: 05/02/2015  PCP: No PCP Per Patient  Admit date: 05/02/2015 Discharge date: 05/05/2015  Time spent: 35 minutes  Recommendations for Outpatient Follow-up:  1. This follow-up on CBC on hospital follow-up, she had GI bleed, OM1 hemoglobin remained stable during this hospitalization having hemoglobin of 12.4 on day of discharge   Discharge Diagnoses:  Principal Problem:   Hypotension due to blood loss Active Problems:   GERD   BRBPR (bright red blood per rectum)   Active bleeding   Abdominal pain, epigastric   Vomiting   GIB (gastrointestinal bleeding)   Hematochezia   Nausea vomiting and diarrhea   Discharge Condition: Stable/improved  Diet recommendation: Regular diet  Filed Weights   05/03/15 0445 05/03/15 0519  Weight: 57.9 kg (127 lb 10.3 oz) 57.9 kg (127 lb 10.3 oz)    History of present illness:  44 yo female h/o gerd comes in with several bouts of nonbloody emesis and diarrhea with associated epigastric abdominal pain for several hours today. No fevers. In the ED she then developed brbpr has had 4 bm loose and bloody. Not vomiting blood or vomiting at all since arrival. Is getting 3 rd liter ivf, pt has become more tachycardic and hypotension in the ED. protonix gtt is ordered. Ct abd /pelvis pending AAS shows no perforation. GI called and will see soon. Pt has no h/o GIB in past. hgb nml initially. No h/o ulcers, no h/o previous gib. No fhx of gi cancers. Denies any chronic previous episodes of abdominal pain before today. No sick contacts with similar illness  Hospital Course:  Patient is a pleasant 44 year old female admitted to the medicine service on 05/03/2015 when she presented with abdominal pain associated with intractable nausea and vomiting along with diarrhea over the past 2 days prior to hospitalization. She was noted to have bright red blood per rectum in the emergency  department. Patient also became hypotensive and tachycardic. Given concerns for hemodynamic stability she was admitted to the stepdown unit and given aggressive IV fluid resuscitation. Initial labs showed a hemoglobin of 12.8 which trended down to 11.5 with the following day. GI was consulted as she underwent upper endoscopy on 05/03/2015. This study was unremarkable  1. Nausea/vomiting/diarrhea -Patient presenting with multiple episodes of nausea and vomiting over the past 48 hours prior to hospitalization. She was worked up with a CT scan of abdomen and pelvis in the emergency department which revealed diffuse colonic fluid levels with mild distention, no definitive bowel wall thickening or edema.  -Illness suspected to be secondary to viral syndrome.  -EGD performed on 05/03/2015 unremarkable  -Symptoms improved, as she was tolerating regular diet by 05/05/2015  2. Probable lower GI bleed -Patient having multiple episodes of hematochezia overnight for which GI was consulted and underwent upper endoscopy during this hospitalization. This study was unremarkable -Her hemoglobin remained stable, having hemoglobin of 12.4 on 05/05/2015  3. Hypotension -Patient having a blood pressure of 89/76 in the emergency room likely secondary to profound hypovolemia secondary to GI loss from intractable nausea vomiting and diarrhea -Blood pressures improving with IV fluid resuscitation   Procedures:  EGD performed on 05/03/2015  Consultations:  GI  Discharge Exam: Filed Vitals:   05/05/15 0521  BP: 101/64  Pulse: 69  Temp: 97.9 F (36.6 C)  Resp: 18    General: Patient is in no acute distress, states better, tolerating regular diet Cardiovascular: Regular rate and rhythm normal S1-S2 no murmurs rubs  or gallops Respiratory: Normal respiratory effort, lungs are clear to auscultation bilaterally Abdomen: Soft nontender nondistended, positive bowel sounds  Discharge  Instructions   Discharge Instructions    Call MD for:  difficulty breathing, headache or visual disturbances    Complete by:  As directed      Call MD for:  extreme fatigue    Complete by:  As directed      Call MD for:  hives    Complete by:  As directed      Call MD for:  persistant dizziness or light-headedness    Complete by:  As directed      Call MD for:  persistant nausea and vomiting    Complete by:  As directed      Call MD for:  redness, tenderness, or signs of infection (pain, swelling, redness, odor or green/yellow discharge around incision site)    Complete by:  As directed      Call MD for:  severe uncontrolled pain    Complete by:  As directed      Call MD for:  temperature >100.4    Complete by:  As directed      Call MD for:    Complete by:  As directed      Diet - low sodium heart healthy    Complete by:  As directed      Increase activity slowly    Complete by:  As directed           Current Discharge Medication List    START taking these medications   Details  traMADol (ULTRAM) 50 MG tablet Take 1 tablet (50 mg total) by mouth every 6 (six) hours as needed. Qty: 12 tablet, Refills: 0      CONTINUE these medications which have NOT CHANGED   Details  cetirizine (ZYRTEC) 10 MG tablet Take 10 mg by mouth daily as needed for allergies.    Multiple Vitamins-Minerals (MULTIVITAMIN GUMMIES ADULTS PO) Take 1 tablet by mouth daily.    Tetrahydrozoline-Zn Sulfate (ALLERGY RELIEF EYE DROPS OP) Place 1 drop into both eyes daily as needed (red, itchy eyes).    sertraline (ZOLOFT) 50 MG tablet Take 50 mg by mouth 2 (two) times daily as needed (for migraine).        Allergies  Allergen Reactions  . Amoxicillin Itching and Rash   Follow-up Information    Follow up with Willette ClusterPaula Guenther, NP On 05/15/2015.   Specialty:  Nurse Practitioner   Why:  1:30 PM to follow up diarrhea.    Contact information:   [address] [place] [address] [phone]         The results of significant diagnostics from this hospitalization (including imaging, microbiology, ancillary and laboratory) are listed below for reference.    Significant Diagnostic Studies: Ct Abdomen Pelvis W Contrast  05/03/2015   CLINICAL DATA:  Bloody diarrhea.  EXAM: CT ABDOMEN AND PELVIS WITH CONTRAST  TECHNIQUE: Multidetector CT imaging of the abdomen and pelvis was performed using the standard protocol following bolus administration of intravenous contrast.  CONTRAST:  80mL OMNIPAQUE IOHEXOL 300 MG/ML  SOLN  COMPARISON:  None.  FINDINGS: BODY WALL: No contributory findings.  LOWER CHEST: No contributory findings.  ABDOMEN/PELVIS:  Liver: No focal abnormality.  Biliary: No evidence of biliary obstruction or stone.  Pancreas: Unremarkable.  Spleen: Unremarkable.  Adrenals: Unremarkable.  Kidneys and ureters: No hydronephrosis or stone. Sub cm low-density in the left kidney is a presumed cyst.  Bladder: Unremarkable.  Reproductive: No pathologic findings.  Corpus luteum on the left.  Bowel: Diffuse colonic fluid levels withmild distension. There is no definitive bowel wall thickening and no edema around the bowel. Appendectomy. No bowel obstruction.  Retroperitoneum: No mass or adenopathy.  Peritoneum: No ascites or pneumoperitoneum.  Vascular: No acute abnormality. Noncalcified atherosclerotic plaque noted along the posterior wall of the upper abdominal aorta.  OSSEOUS: No acute abnormalities.  IMPRESSION: Diarrheal illness, usually reflecting an infectious enterocolitis.   Electronically Signed   By: Marnee Spring M.D.   On: 05/03/2015 04:19   Dg Abd Acute W/chest  05/03/2015   CLINICAL DATA:  Evaluate for small bowel obstruction. Nausea, vomiting, diarrhea.  EXAM: DG ABDOMEN ACUTE W/ 1V CHEST  COMPARISON:  None.  FINDINGS: Proximal colonic fluid levels, nonspecific but potentially related to history of diarrhea. No evidence of bowel obstruction or perforation. No concerning intra-abdominal  mass effect or calcification.  Normal heart size and mediastinal contours. No acute infiltrate or edema. No effusion or pneumothorax. No acute osseous findings.  IMPRESSION: Negative abdominal radiographs.  No acute cardiopulmonary disease.   Electronically Signed   By: Marnee Spring M.D.   On: 05/03/2015 01:04    Microbiology: Recent Results (from the past 240 hour(s))  Stool culture     Status: None (Preliminary result)   Collection Time: 05/03/15  3:18 AM  Result Value Ref Range Status   Specimen Description Stool  Final   Special Requests Normal  Final   Culture   Final    NO SUSPICIOUS COLONIES, CONTINUING TO HOLD Performed at [facility]    Report Status PENDING  Incomplete  Clostridium Difficile by PCR     Status: None   Collection Time: 05/03/15  3:18 AM  Result Value Ref Range Status   C difficile by pcr NEGATIVE NEGATIVE Final  MRSA PCR Screening     Status: None   Collection Time: 05/03/15  5:25 AM  Result Value Ref Range Status   MRSA by PCR NEGATIVE NEGATIVE Final    Comment:        The GeneXpert MRSA Assay (FDA approved for NASAL specimens only), is one component of a comprehensive MRSA colonization surveillance program. It is not intended to diagnose MRSA infection nor to guide or monitor treatment for MRSA infections.      Labs: Basic Metabolic Panel:  Recent Labs Lab 05/02/15 2301 05/03/15 0613 05/04/15 0608 05/05/15 0540  NA 138 136 134* 135  K 3.3* 4.0 3.5 3.8  CL 104 109 108 105  CO2 22 18* 20* 23  GLUCOSE 131* 111* 96 96  BUN 15 10 <5* <5*  CREATININE 0.72 0.68 0.57 0.55  CALCIUM 9.4 7.9* 7.8* 8.4*  MG 1.7  --   --   --    Liver Function Tests:  Recent Labs Lab 05/02/15 2301  AST 21  ALT 14  ALKPHOS 78  BILITOT 0.5  PROT 8.3*  ALBUMIN 4.3    Recent Labs Lab 05/02/15 2301  LIPASE 21*   No results for input(s): AMMONIA in the last 168 hours. CBC:  Recent Labs Lab 05/02/15 2301 05/03/15 0315  05/03/15 1125  05/03/15 1735 05/03/15 2330 05/04/15 0608 05/05/15 0540  WBC 9.8 8.9  --   --   --   --  7.7 6.2  NEUTROABS 7.6 7.7  --   --   --   --   --   --   HGB 14.2 13.3  < > 11.5*  10.8* 10.6* 11.3* 12.4  HCT 42.5 39.9  < > 35.3* 32.3* 32.6* 33.9* 37.3  MCV 77.1* 77.3*  --   --   --   --  77.4* 76.9*  PLT 279 225  --   --   --   --  213 229  < > = values in this interval not displayed. Cardiac Enzymes: No results for input(s): CKTOTAL, CKMB, CKMBINDEX, TROPONINI in the last 168 hours. BNP: BNP (last 3 results) No results for input(s): BNP in the last 8760 hours.  ProBNP (last 3 results) No results for input(s): PROBNP in the last 8760 hours.  CBG: No results for input(s): GLUCAP in the last 168 hours.     SignedJeralyn Bennett  [facility] 05/05/2015, 2:05 PM

## 2015-05-05 NOTE — Progress Notes (Signed)
          Daily Rounding Note  05/05/2015, 11:49 AM  LOS: 2 days   SUBJECTIVE:       Still some blood in loose stools.  Last one was thi AM.  Tolerating full liquids.   OBJECTIVE:         Vital signs in last 24 hours:    Temp:  [97.8 F (36.6 C)-98.4 F (36.9 C)] 97.9 F (36.6 C) (05/31 0521) Pulse Rate:  [69-93] 69 (05/31 0521) Resp:  [17-18] 18 (05/31 0521) BP: (101-132)/(61-82) 101/64 mmHg (05/31 0521) SpO2:  [99 %-100 %] 99 % (05/31 0521) Last BM Date: 05/04/15 Filed Weights   05/03/15 0445 05/03/15 0519  Weight: 127 lb 10.3 oz (57.9 kg) 127 lb 10.3 oz (57.9 kg)   General: looks well   Heart: RRR Chest: clear bil Abdomen: soft, NT, ND  Extremities: no CCE Neuro/Psych:  Pleasant, alert, appropriate.  No gross deficits.   Intake/Output from previous day: 05/30 0701 - 05/31 0700 In: 240 [P.O.:240] Out: -   Intake/Output this shift:    Lab Results:  Recent Labs  05/03/15 0315  05/03/15 2330 05/04/15 0608 05/05/15 0540  WBC 8.9  --   --  7.7 6.2  HGB 13.3  < > 10.6* 11.3* 12.4  HCT 39.9  < > 32.6* 33.9* 37.3  PLT 225  --   --  213 229  < > = values in this interval not displayed. BMET  Recent Labs  05/03/15 0613 05/04/15 0608 05/05/15 0540  NA 136 134* 135  K 4.0 3.5 3.8  CL 109 108 105  CO2 18* 20* 23  GLUCOSE 111* 96 96  BUN 10 <5* <5*  CREATININE 0.68 0.57 0.55  CALCIUM 7.9* 7.8* 8.4*   LFT  Recent Labs  05/02/15 2301  PROT 8.3*  ALBUMIN 4.3  AST 21  ALT 14  ALKPHOS 78  BILITOT 0.5   PT/INR  Recent Labs  05/03/15 0613  LABPROT 14.6  INR 1.12    Studies/Results: No results found.   Scheduled Meds:  Continuous Infusions:  PRN Meds:.alum & mag hydroxide-simeth, benzonatate, morphine injection, ondansetron **OR** ondansetron (ZOFRAN) IV, pseudoephedrine  ASSESMENT:   *  Bloody diarrhea, n/v.  Mostly imporved.  Still with scant amount blood.   Hgb improving. Stool  pathogen panel negative, C diff negative.    PLAN   *  Has ROV with Willette ClusterPaula Guenther NP at GI office on 6/10.  Advised her she can call gi office closer to that date to change date of appt, as her dtr gradutes at 9:30 that same day but schedule is not out for later June or July.   *  No need for home meds.  Pt aware she needs to drinks lots of fluids and can eat what she wants.       Jennye MoccasinSarah Gribbin  05/05/2015, 11:49 AM Pager: [phone] Attending MD note:   I have taken a history, , and reviewed the chart. I agree with the Advanced Practitioner's impression and recommendations. ?? Infectious enteritis.. No definite pathogen found. .She will be set up for outpatient colonoscopy with Dr Rhea Beltonpyrtle.  Willa Roughora Simuel Stebner,MD [facility] Pager # [phone]

## 2015-05-06 LAB — GI PATHOGEN PANEL BY PCR, STOOL
C DIFFICILE TOXIN A/B: NOT DETECTED
CAMPYLOBACTER BY PCR: NOT DETECTED
CRYPTOSPORIDIUM BY PCR: NOT DETECTED
E COLI (ETEC) LT/ST: NOT DETECTED
E coli (STEC): NOT DETECTED
E coli 0157 by PCR: NOT DETECTED
G lamblia by PCR: NOT DETECTED
NOROVIRUS G1/G2: NOT DETECTED
ROTAVIRUS A BY PCR: NOT DETECTED
SALMONELLA BY PCR: NOT DETECTED
Shigella by PCR: NOT DETECTED

## 2015-05-09 LAB — STOOL CULTURE: Special Requests: NORMAL

## 2015-05-15 ENCOUNTER — Other Ambulatory Visit (INDEPENDENT_AMBULATORY_CARE_PROVIDER_SITE_OTHER): Payer: Self-pay

## 2015-05-15 ENCOUNTER — Encounter: Payer: Self-pay | Admitting: Nurse Practitioner

## 2015-05-15 ENCOUNTER — Ambulatory Visit (INDEPENDENT_AMBULATORY_CARE_PROVIDER_SITE_OTHER): Payer: Managed Care, Other (non HMO) | Admitting: Nurse Practitioner

## 2015-05-15 VITALS — BP 120/70 | HR 80 | Ht <= 58 in | Wt 116.1 lb

## 2015-05-15 DIAGNOSIS — R718 Other abnormality of red blood cells: Secondary | ICD-10-CM

## 2015-05-15 DIAGNOSIS — K5909 Other constipation: Secondary | ICD-10-CM

## 2015-05-15 DIAGNOSIS — K625 Hemorrhage of anus and rectum: Secondary | ICD-10-CM

## 2015-05-15 DIAGNOSIS — Z789 Other specified health status: Secondary | ICD-10-CM

## 2015-05-15 LAB — CBC
HCT: 38.6 % (ref 36.0–46.0)
HEMOGLOBIN: 12.7 g/dL (ref 12.0–15.0)
MCHC: 33 g/dL (ref 30.0–36.0)
MCV: 77.4 fl — ABNORMAL LOW (ref 78.0–100.0)
Platelets: 341 10*3/uL (ref 150.0–400.0)
RBC: 4.99 Mil/uL (ref 3.87–5.11)
RDW: 14 % (ref 11.5–15.5)
WBC: 7.4 10*3/uL (ref 4.0–10.5)

## 2015-05-15 LAB — FERRITIN: FERRITIN: 66 ng/mL (ref 10.0–291.0)

## 2015-05-15 NOTE — Progress Notes (Signed)
     History of Present Illness:  Patient is a 44 year old female known remotely to Dr. Christella Hartigan. She had a normal EGD in 2010, done for evaluation of dysphagia. Patient was seen in the hospital late May for acute nausea, vomiting, bloody diarrhea, and abdominal pain . CT scan showed fluid levels throughout the colon consistent with diarrhea. No other abnormalities. Because of some epigastric pain patient underwent EGD which was normal. Stool pathogen panel was negative, c-diff negative. Presenting hemoglobin was 14.2, it declined to 10.6 after hydration but by discharge was up to 12.4. Her MCV was low at 77.  No further rectal bleeding nor abdominal pain. Her bowel movements are normal unless she eats fresh cherries. Weight stable.   Current Medications, Allergies, Past Medical History, Past Surgical History, Family History and Social History were reviewed in [employer] record.  Studies:   Ct Abdomen Pelvis W Contrast  05/03/2015   CLINICAL DATA:  Bloody diarrhea.  EXAM: CT ABDOMEN AND PELVIS WITH CONTRAST  TECHNIQUE: Multidetector CT imaging of the abdomen and pelvis was performed using the standard protocol following bolus administration of intravenous contrast.  CONTRAST:  51mL OMNIPAQUE IOHEXOL 300 MG/ML  SOLN  COMPARISON:  None.  FINDINGS: BODY WALL: No contributory findings.  LOWER CHEST: No contributory findings.  ABDOMEN/PELVIS:  Liver: No focal abnormality.  Biliary: No evidence of biliary obstruction or stone.  Pancreas: Unremarkable.  Spleen: Unremarkable.  Adrenals: Unremarkable.  Kidneys and ureters: No hydronephrosis or stone. Sub cm low-density in the left kidney is a presumed cyst.  Bladder: Unremarkable.  Reproductive: No pathologic findings.  Corpus luteum on the left.  Bowel: Diffuse colonic fluid levels withmild distension. There is no definitive bowel wall thickening and no edema around the bowel. Appendectomy. No bowel obstruction.  Retroperitoneum: No mass or  adenopathy.  Peritoneum: No ascites or pneumoperitoneum.  Vascular: No acute abnormality. Noncalcified atherosclerotic plaque noted along the posterior wall of the upper abdominal aorta.  OSSEOUS: No acute abnormalities.  IMPRESSION: Diarrheal illness, usually reflecting an infectious enterocolitis.   Electronically Signed   By: Marnee Spring M.D.   On: 05/03/2015 04:19    Physical Exam: General: Pleasant, well developed , Asian female in no acute distress Head: Normocephalic and atraumatic Eyes:  sclerae anicteric, conjunctiva pink  Ears: Normal auditory acuity Lungs: Clear throughout to auscultation Heart: Regular rate and rhythm Abdomen: Soft, non distended, non-tender. No masses, no hepatomegaly. Normal bowel sounds Musculoskeletal: Symmetrical with no gross deformities  Extremities: No edema  Neurological: Alert oriented x 4, grossly nonfocal Psychological:  Alert and cooperative. Normal mood and affect  Assessment and Recommendations:  32. 44 year old female recently admitted with acute nausea, vomiting, epigastric pain and diarrhea with blood. Stool studies negative. EGD negative.  Despite negative stool studies this still seems infectious. Her symptoms have resolved, in fact she is having problems with constipation now. She has Miralax at home, may take one capful daily. No need for further GI workup unless anemic, see #2  2. Microcytosis. Hgb normal on admission but dropped some after hydration. Will repeat CBC and check iron studies today. Of note patient still has menstrual cycles but they are regular and not abnormally heavy. Will call her with results.

## 2015-05-15 NOTE — Patient Instructions (Signed)
Go to the basement for labs today Use Miralax once daily which can be purchased over the counter

## 2015-05-17 NOTE — Progress Notes (Signed)
i agree with the above note, plan 

## 2015-05-18 LAB — IBC PANEL
IRON: 91 ug/dL (ref 42–145)
Saturation Ratios: 30.7 % (ref 20.0–50.0)
TRANSFERRIN: 212 mg/dL (ref 212.0–360.0)

## 2015-07-03 ENCOUNTER — Ambulatory Visit (INDEPENDENT_AMBULATORY_CARE_PROVIDER_SITE_OTHER): Payer: Managed Care, Other (non HMO) | Admitting: Physician Assistant

## 2015-07-03 VITALS — BP 127/85 | HR 82 | Temp 97.9°F | Resp 16 | Ht <= 58 in | Wt 119.2 lb

## 2015-07-03 DIAGNOSIS — Z23 Encounter for immunization: Secondary | ICD-10-CM | POA: Diagnosis not present

## 2015-07-03 DIAGNOSIS — Z1239 Encounter for other screening for malignant neoplasm of breast: Secondary | ICD-10-CM

## 2015-07-03 NOTE — Patient Instructions (Signed)
Return in 1 month for 2nd vaccine.  Return 6 months after 2nd vaccine for third vaccine. You will get a phone call about your mammogram. Return for a complete physical.

## 2015-07-03 NOTE — Progress Notes (Signed)
[facility] [address], [place] [address] [phone]  Date:  07/03/2015   Name:  Erika Rogers   DOB:  11/26/1971   MRN:  [id number]  PCP:  No PCP Per Patient    Chief Complaint: TDAP   History of Present Illness:  This is a 44 y.o. female who is presenting for tdap vaccine. States she has never had tetanus vaccines. She was here a few days ago for her children to get vaccines and Dr. Milus Glazier told her she should return for tdap vaccines.  Pap smear 2 years ago and normal.  Never had mammogram.  Review of Systems:  Review of Systems See HPI  Patient Active Problem List   Diagnosis Date Noted  . Microcytosis 05/15/2015  . BRBPR (bright red blood per rectum) 05/03/2015  . Active bleeding 05/03/2015  . Hypotension due to blood loss 05/03/2015  . Abdominal pain, epigastric 05/03/2015  . Vomiting 05/03/2015  . GIB (gastrointestinal bleeding) 05/03/2015  . Bloody diarrhea   . Hematochezia   . Nausea vomiting and diarrhea   . GERD 02/20/2009  . DYSPHAGIA 02/20/2009    Prior to Admission medications   Medication Sig Start Date End Date Taking? Authorizing Provider  ibuprofen (ADVIL,MOTRIN) 200 MG tablet Take 200 mg by mouth every 6 (six) hours as needed.   Yes Historical Provider, MD  sertraline (ZOLOFT) 50 MG tablet Take 50 mg by mouth daily.   Yes Historical Provider, MD  cetirizine (ZYRTEC) 10 MG tablet Take 10 mg by mouth as needed for allergies.     Historical Provider, MD  Multiple Vitamins-Minerals (MULTIVITAMIN GUMMIES ADULTS PO) Take 1 tablet by mouth as needed.     Historical Provider, MD  Tetrahydrozoline-Zn Sulfate (ALLERGY RELIEF EYE DROPS OP) Place 1 drop into both eyes daily as needed (red, itchy eyes).    Historical Provider, MD  traMADol (ULTRAM) 50 MG tablet Take 1 tablet (50 mg total) by mouth every 6 (six) hours as needed. Patient not taking: Reported on 07/03/2015 05/05/15   Jeralyn Bennett, MD    Allergies  Allergen Reactions   . Amoxicillin Itching and Rash    Past Surgical History  Procedure Laterality Date  . Tonsillectomy    . Appendectomy    . Esophagogastroduodenoscopy N/A 05/03/2015    Procedure: ESOPHAGOGASTRODUODENOSCOPY (EGD);  Surgeon: Beverley Fiedler, MD;  Location: [facility] ENDOSCOPY;  Service: Endoscopy;  Laterality: N/A;  . Nasal sinus surgery      History  Substance Use Topics  . Smoking status: Never Smoker   . Smokeless tobacco: Never Used  . Alcohol Use: No    Family History  Problem Relation Age of Onset  . Diabetes Brother   . Mental illness Brother     Medication list has been reviewed and updated.  Physical Examination:  Physical Exam  Constitutional: She is oriented to person, place, and time. She appears well-developed and well-nourished. No distress.  HENT:  Head: Normocephalic and atraumatic.  Right Ear: Hearing normal.  Left Ear: Hearing normal.  Nose: Nose normal.  Eyes: Conjunctivae and lids are normal. Right eye exhibits no discharge. Left eye exhibits no discharge. No scleral icterus.  Pulmonary/Chest: Effort normal. No respiratory distress.  Musculoskeletal: Normal range of motion.  Neurological: She is alert and oriented to person, place, and time.  Skin: Skin is warm, dry and intact. No lesion and no rash noted.  Psychiatric: She has a normal mood and affect. Her speech is normal and behavior is  normal. Thought content normal.    BP 127/85 mmHg  Pulse 82  Temp(Src) 97.9 F (36.6 C) (Oral)  Resp 16  Ht 4' 9.5" (1.461 m)  Wt 119 lb 3.2 oz (54.069 kg)  BMI 25.33 kg/m2  SpO2 97%  LMP 06/14/2015  Assessment and Plan:  1. Need for Tdap vaccination - Tdap vaccine greater than or equal to 7yo IM - Td vaccine greater than or equal to 7yo preservative free IM; Future - Td vaccine greater than or equal to 7yo preservative free IM; Future  2. Breast cancer screening - MM Digital Screening; Future  I recommended she return at her convenience for a  CPE.   [place] Brackett, MHS [facility]  07/03/2015

## 2015-07-09 ENCOUNTER — Other Ambulatory Visit: Payer: Self-pay

## 2015-07-09 DIAGNOSIS — Z1231 Encounter for screening mammogram for malignant neoplasm of breast: Secondary | ICD-10-CM

## 2015-08-04 ENCOUNTER — Ambulatory Visit (INDEPENDENT_AMBULATORY_CARE_PROVIDER_SITE_OTHER): Payer: Managed Care, Other (non HMO) | Admitting: Internal Medicine

## 2015-08-04 VITALS — BP 118/84 | HR 83 | Temp 98.4°F | Resp 18 | Ht <= 58 in | Wt 121.0 lb

## 2015-08-04 DIAGNOSIS — Z23 Encounter for immunization: Secondary | ICD-10-CM

## 2015-08-04 NOTE — Progress Notes (Signed)
F/u Td #2 Needs f/u 6-12 mos for Td #3  Vaccine for tetanus toxoid

## 2015-10-01 ENCOUNTER — Ambulatory Visit (INDEPENDENT_AMBULATORY_CARE_PROVIDER_SITE_OTHER): Payer: Managed Care, Other (non HMO) | Admitting: Family Medicine

## 2015-10-01 VITALS — BP 120/72 | HR 92 | Temp 99.1°F | Resp 20 | Ht <= 58 in | Wt 119.0 lb

## 2015-10-01 DIAGNOSIS — J028 Acute pharyngitis due to other specified organisms: Secondary | ICD-10-CM

## 2015-10-01 DIAGNOSIS — R509 Fever, unspecified: Secondary | ICD-10-CM

## 2015-10-01 DIAGNOSIS — F320 Major depressive disorder, single episode, mild: Secondary | ICD-10-CM

## 2015-10-01 LAB — POCT RAPID STREP A (OFFICE): RAPID STREP A SCREEN: NEGATIVE

## 2015-10-01 LAB — POCT INFLUENZA A/B
INFLUENZA B, POC: NEGATIVE
Influenza A, POC: NEGATIVE

## 2015-10-01 MED ORDER — SERTRALINE HCL 50 MG PO TABS: 50.0000 mg | ORAL_TABLET | Freq: Every day | ORAL | Status: DC

## 2015-10-01 MED ORDER — AZITHROMYCIN 250 MG PO TABS: ORAL_TABLET | ORAL | Status: DC

## 2015-10-01 MED ORDER — IBUPROFEN 200 MG PO TABS
400.0000 mg | ORAL_TABLET | Freq: Once | ORAL | Status: AC
  Administered 2015-10-01: 400 mg via ORAL

## 2015-10-01 NOTE — Progress Notes (Signed)
[facility] [address], [place] [address] [phone]  Date:  10/01/2015   Name:  Erika Rogers   DOB:  06-08-1971   MRN:  [id number]  PCP:  No PCP Per Patient    Chief Complaint: Cough; Sore Throat; Sinusitis; and Medication Refill   History of Present Illness:  Erika Rogers is a 44 y.o. very pleasant female patient who presents with the following:  She is here today with illness for 4-5 days.  She has noted feeling cold, "fever," and has used mucinex.  However these sx persist and she seems to be getting worse.  She has a productive cough and some post- tussive emesis She does not have body aches but is very fatigued.   No GI symptoms LMP 10/2 She is generally in good health  She had surgery for her sinuses last year.   She alos needs a refill of her zoloft- this is working well for her  No medications yet today except for mucinex Here today with her pre-teen son  Patient Active Problem List   Diagnosis Date Noted  . Microcytosis 05/15/2015  . BRBPR (bright red blood per rectum) 05/03/2015  . Active bleeding 05/03/2015  . Hypotension due to blood loss 05/03/2015  . Abdominal pain, epigastric 05/03/2015  . Vomiting 05/03/2015  . GIB (gastrointestinal bleeding) 05/03/2015  . Bloody diarrhea   . Hematochezia   . Nausea vomiting and diarrhea   . GERD 02/20/2009  . DYSPHAGIA 02/20/2009    Past Medical History  Diagnosis Date  . Allergy   . Depression     Past Surgical History  Procedure Laterality Date  . Tonsillectomy    . Appendectomy    . Esophagogastroduodenoscopy N/A 05/03/2015    Procedure: ESOPHAGOGASTRODUODENOSCOPY (EGD);  Surgeon: Beverley Fiedler, MD;  Location: [facility] ENDOSCOPY;  Service: Endoscopy;  Laterality: N/A;  . Nasal sinus surgery      Social History  Substance Use Topics  . Smoking status: Never Smoker   . Smokeless tobacco: Never Used  . Alcohol Use: No    Family History  Problem Relation Age of Onset  . Diabetes  Brother   . Mental illness Brother     Allergies  Allergen Reactions  . Amoxicillin Itching and Rash    Medication list has been reviewed and updated.  Current Outpatient Prescriptions on File Prior to Visit  Medication Sig Dispense Refill  . sertraline (ZOLOFT) 50 MG tablet Take 50 mg by mouth daily.    . cetirizine (ZYRTEC) 10 MG tablet Take 10 mg by mouth as needed for allergies.     Marland Kitchen ibuprofen (ADVIL,MOTRIN) 200 MG tablet Take 200 mg by mouth every 6 (six) hours as needed.    . Multiple Vitamins-Minerals (MULTIVITAMIN GUMMIES ADULTS PO) Take 1 tablet by mouth as needed.     Jeananne Rama Sulfate (ALLERGY RELIEF EYE DROPS OP) Place 1 drop into both eyes daily as needed (red, itchy eyes).    . traMADol (ULTRAM) 50 MG tablet Take 1 tablet (50 mg total) by mouth every 6 (six) hours as needed. (Patient not taking: Reported on 07/03/2015) 12 tablet 0   No current facility-administered medications on file prior to visit.    Review of Systems:  As per HPI- otherwise negative.   Physical Examination: Filed Vitals:   10/01/15 1701  BP: 120/72  Pulse: 92  Temp: 99.1 F (37.3 C)  Resp: 20   Filed Vitals:   10/01/15 1701  Height:  (  1.448 m)  Weight: 119 lb (53.978 kg)   Body mass index is 25.74 kg/(m^2). Ideal Body Weight: Weight in (lb) to have BMI = 25: 115.3  GEN: WDWN, NAD, Non-toxic, A & O x 3, appears not to feel well HEENT: Atraumatic, Normocephalic. Neck supple. No masses, No LAD.  Bilateral TM wnl, oropharynx erythematous but no exudate.  PEERL,EOMI.   Ears and Nose: No external deformity. CV: RRR, No M/G/R. No JVD. No thrill. No extra heart sounds. PULM: CTA B, no wheezes, crackles, rhonchi. No retractions. No resp. distress. No accessory muscle use. ABD: S, NT, ND. No rebound. No HSM. EXTR: No c/c/e NEURO Normal gait.  PSYCH: Normally interactive. Conversant. Not depressed or anxious appearing.  Calm demeanor.   Given 400 mg of ibuprofen  here  Results for orders placed or performed in visit on 10/01/15  POCT Influenza A/B  Result Value Ref Range   Influenza A, POC Negative Negative   Influenza B, POC Negative Negative  POCT rapid strep A  Result Value Ref Range   Rapid Strep A Screen Negative Negative     Assessment and Plan: Fever, unspecified - Plan: POCT Influenza A/B, POCT rapid strep A, ibuprofen (ADVIL,MOTRIN) tablet 400 mg  Acute pharyngitis due to other specified organisms - Plan: POCT rapid strep A, ibuprofen (ADVIL,MOTRIN) tablet 400 mg, azithromycin (ZITHROMAX) 250 MG tablet  Mild single current episode of major depressive disorder (HCC) - Plan: sertraline (ZOLOFT) 50 MG tablet  Here today with several days of fever, cough, and post-tussive emesis.  Flu and strep swabs negative She endorses worsening of her sx so did cover for bronchitis with azithromycin  Encouraged antipyretics for fever and aches Follow-up if not better soon  Signed Abbe AmsterdamJessica Bellarose Burtt, MD

## 2015-10-01 NOTE — Patient Instructions (Signed)
We are going to treat you with azithromycin for your sore throat and cough Use ibuprofen and/ or tylenol as needed for pain and fever  Let us know if you do not feel better soon- Sooner if worse.

## 2015-10-26 ENCOUNTER — Encounter: Payer: Self-pay | Admitting: Gastroenterology

## 2015-12-10 ENCOUNTER — Ambulatory Visit: Payer: Managed Care, Other (non HMO)

## 2016-02-22 DIAGNOSIS — Z0271 Encounter for disability determination: Secondary | ICD-10-CM

## 2016-03-01 ENCOUNTER — Ambulatory Visit (INDEPENDENT_AMBULATORY_CARE_PROVIDER_SITE_OTHER): Payer: Managed Care, Other (non HMO) | Admitting: Physician Assistant

## 2016-03-01 DIAGNOSIS — Z23 Encounter for immunization: Secondary | ICD-10-CM

## 2016-03-01 NOTE — Progress Notes (Signed)
Patient here for a third TDAP.  She has no history of tetanus administration previous to this series.  Deliah BostonMichael Gayathri Futrell, MS, PA-C 1:01 PM, 03/01/2016   Diagnoses and all orders for this visit:  Need for Tdap vaccination -     Tdap vaccine greater than or equal to 46yo IM

## 2017-09-04 DIAGNOSIS — R87610 Atypical squamous cells of undetermined significance on cytologic smear of cervix (ASC-US): Secondary | ICD-10-CM

## 2017-09-04 HISTORY — DX: Atypical squamous cells of undetermined significance on cytologic smear of cervix (ASC-US): R87.610

## 2017-09-15 ENCOUNTER — Ambulatory Visit (INDEPENDENT_AMBULATORY_CARE_PROVIDER_SITE_OTHER): Payer: Managed Care, Other (non HMO) | Admitting: Gynecology

## 2017-09-15 ENCOUNTER — Encounter: Payer: Self-pay | Admitting: Gynecology

## 2017-09-15 VITALS — BP 118/74 | Ht <= 58 in | Wt 115.0 lb

## 2017-09-15 DIAGNOSIS — N951 Menopausal and female climacteric states: Secondary | ICD-10-CM | POA: Diagnosis not present

## 2017-09-15 DIAGNOSIS — N926 Irregular menstruation, unspecified: Secondary | ICD-10-CM

## 2017-09-15 DIAGNOSIS — K625 Hemorrhage of anus and rectum: Secondary | ICD-10-CM

## 2017-09-15 DIAGNOSIS — Z01419 Encounter for gynecological examination (general) (routine) without abnormal findings: Secondary | ICD-10-CM | POA: Diagnosis not present

## 2017-09-15 DIAGNOSIS — Z13220 Encounter for screening for lipoid disorders: Secondary | ICD-10-CM

## 2017-09-15 DIAGNOSIS — N898 Other specified noninflammatory disorders of vagina: Secondary | ICD-10-CM | POA: Diagnosis not present

## 2017-09-15 LAB — WET PREP FOR TRICH, YEAST, CLUE

## 2017-09-15 MED ORDER — FLUCONAZOLE 150 MG PO TABS: 150.0000 mg | ORAL_TABLET | Freq: Once | ORAL | 0 refills | Status: AC

## 2017-09-15 NOTE — Addendum Note (Signed)
Addended by: Rushie Goltz on: 09/15/2017 10:29 AM   Modules accepted: Orders

## 2017-09-15 NOTE — Progress Notes (Signed)
Erika Rogers 03-Aug-1971 [id number]        46 y.o.  [id number] new patient for annual gynecologic exam.  Also complaining of :  1. Vaginal itching of the last several months. No discharge. No urinary symptoms such as frequency dysuria or urgency or back pain fever or chills. 2. Irregular menses/menopausal symptoms. Patient notes that her menses will come earlier or later in the month with occasional skips. Also sometimes last longer or shorter in flow. Noting some hot flushes and night sweats. No weight changes skin or hair changes. No diarrhea or constipation.  Past medical history,surgical history, problem list, medications, allergies, family history and social history were all reviewed and documented as reviewed in the EPIC chart.  ROS:  Performed with pertinent positives and negatives included in the history, assessment and plan.   Additional significant findings : none   Exam: Kennon Portela assistant Vitals:   09/15/17 0937  BP: 118/74  Weight: 115 lb (52.2 kg)  Height:  (1.473 m)   Body mass index is 24.04 kg/m.  General appearance:  Normal affect, orientation and appearance. Skin: Grossly normal HEENT: Without gross lesions.  No cervical or supraclavicular adenopathy. Thyroid normal.  Lungs:  Clear without wheezing, rales or rhonchi Cardiac: RR, without RMG Abdominal:  Soft, nontender, without masses, guarding, rebound, organomegaly or hernia Breasts:  Examined lying and sitting without masses, retractions, discharge or axillary adenopathy. Pelvic:  Ext, BUS, Vagina: normal. Wet prep done  Cervix: normal. Pap smear done  Uterus: anteverted, normal size, shape and contour, midline and mobile nontender   Adnexa: Without masses or tenderness    Anus and perineum: Normal   Rectovaginal: Normal sphincter tone without palpated masses or tenderness.    Assessment/Plan:  46 y.o. [id number] female for annual gynecologic exam with mild menstrual irregularity, no contraception.    1. Irregular menses/menopausal symptoms. Suspect the patient is entering the perimenopausal time frame. Over the last 6 months or so she's noticed her periods lasting longer or shorter in duration as well as spacing. We'll check baseline FSH TSH. Reviewed what to expect at this point and she is going to keep a menstrual calendar. As long as less frequent but fairly regular them will follow. If prolonged or significant atypical bleeding she'll call and we'll pursue a more involved workup to include beginning with the sonohysterogram.  if significant menopausal symptoms develop she'll call and we'll discuss treatment options. 2. Vaginal itching.  Wet prep was positive for yeast. Will treat with Diflucan 150 mg 1 dose. Follow up if symptoms persist, worsen or recur. 3. Contraception. Patient states she is not using contraception. I discussed with her it is possible for pregnancy and that she really needs to use contraception. She declines any prescription contraception. I encouraged the use of condoms. 4. Mammography never. I strongly recommended she schedule a screening mammogram. Benefits of early detection discussed. She agrees to arrange. Names and numbers provided. Breast exam normal today. 5. Pap smear reported 2016.  Pap smear done today. No history of abnormal Pap smears previously. 6. Health maintenance. Patient requests screening lab work. CBC, CMP, lipid profile, TSH, FSH, urinalysis ordered. Follow up in one year, sooner if significant menstrual irregularity.  Additional time in excess of her routine gynecologic exam was spent in direct face to face counseling and coordination of care in regards to her vaginal itching, irregular menses and menopausal symptoms.  [id number]   Dara Lords MD, 10:10 AM  09/15/2017

## 2017-09-15 NOTE — Addendum Note (Signed)
Addended by: Dayna Barker on: 09/15/2017 10:40 AM   Modules accepted: Orders

## 2017-09-15 NOTE — Patient Instructions (Signed)
Call to Schedule your mammogram  Facilities in [place]: 1)  [facility]. [facility], [address] Phone: [phone] 2)  Dr. Bertrand at [facility]  [address] Phone: [phone]     Mammogram A mammogram is an X-ray test to find changes in a woman's breast. You should get a mammogram if:  You are 47 years of age or older  You have risk factors.   Your doctor recommends that you have one.  BEFORE THE TEST  Do not schedule the test the week before your period, especially if your breasts are sore during this time.  On the day of your mammogram:  Wash your breasts and armpits well. After washing, do not put on any deodorant or talcum powder on until after your test.   Eat and drink as you usually do.   Take your medicines as usual.   If you are diabetic and take insulin, make sure you:   Eat before coming for your test.   Take your insulin as usual.   If you cannot keep your appointment, call before the appointment to cancel. Schedule another appointment.  TEST  You will need to undress from the waist up. You will put on a hospital gown.   Your breast will be put on the mammogram machine, and it will press firmly on your breast with a piece of plastic called a compression paddle. This will make your breast flatter so that the machine can X-ray all parts of your breast.   Both breasts will be X-rayed. Each breast will be X-rayed from above and from the side. An X-ray might need to be taken again if the picture is not good enough.   The mammogram will last about 15 to 30 minutes.  AFTER THE TEST Finding out the results of your test Ask when your test results will be ready. Make sure you get your test results.  Document Released: 02/17/2009 Document Revised: 11/10/2011 Document Reviewed: 02/17/2009 Exit[employer] Patient Information 2012 [employer].   

## 2017-09-16 LAB — COMPREHENSIVE METABOLIC PANEL
AG Ratio: 1.3 (calc) (ref 1.0–2.5)
ALT: 7 U/L (ref 6–29)
AST: 16 U/L (ref 10–35)
Albumin: 4.3 g/dL (ref 3.6–5.1)
Alkaline phosphatase (APISO): 80 U/L (ref 33–115)
BILIRUBIN TOTAL: 0.8 mg/dL (ref 0.2–1.2)
BUN: 13 mg/dL (ref 7–25)
CALCIUM: 9.3 mg/dL (ref 8.6–10.2)
CO2: 24 mmol/L (ref 20–32)
CREATININE: 0.71 mg/dL (ref 0.50–1.10)
Chloride: 102 mmol/L (ref 98–110)
Globulin: 3.2 g/dL (calc) (ref 1.9–3.7)
Glucose, Bld: 91 mg/dL (ref 65–99)
POTASSIUM: 4.7 mmol/L (ref 3.5–5.3)
SODIUM: 136 mmol/L (ref 135–146)
Total Protein: 7.5 g/dL (ref 6.1–8.1)

## 2017-09-16 LAB — CBC WITH DIFFERENTIAL/PLATELET
Basophils Absolute: 61 cells/uL (ref 0–200)
Basophils Relative: 0.8 %
Eosinophils Absolute: 669 cells/uL — ABNORMAL HIGH (ref 15–500)
Eosinophils Relative: 8.8 %
HCT: 42.9 % (ref 35.0–45.0)
Hemoglobin: 13.4 g/dL (ref 11.7–15.5)
Lymphs Abs: 2318 cells/uL (ref 850–3900)
MCH: 24.3 pg — ABNORMAL LOW (ref 27.0–33.0)
MCHC: 31.2 g/dL — ABNORMAL LOW (ref 32.0–36.0)
MCV: 77.9 fL — ABNORMAL LOW (ref 80.0–100.0)
MPV: 10.9 fL (ref 7.5–12.5)
Monocytes Relative: 6.9 %
NEUTROS ABS: 4028 {cells}/uL (ref 1500–7800)
NEUTROS PCT: 53 %
Platelets: 291 10*3/uL (ref 140–400)
RBC: 5.51 10*6/uL — AB (ref 3.80–5.10)
RDW: 13.8 % (ref 11.0–15.0)
Total Lymphocyte: 30.5 %
WBC mixed population: 524 cells/uL (ref 200–950)
WBC: 7.6 10*3/uL (ref 3.8–10.8)

## 2017-09-16 LAB — TSH: TSH: 1.99 mIU/L

## 2017-09-16 LAB — LIPID PANEL
CHOL/HDL RATIO: 4.4 (calc) (ref <5.0)
Cholesterol: 238 mg/dL — ABNORMAL HIGH (ref <200)
HDL: 54 mg/dL (ref >50)
LDL Cholesterol (Calc): 148 mg/dL (calc) — ABNORMAL HIGH
NON-HDL CHOLESTEROL (CALC): 184 mg/dL — AB (ref <130)
Triglycerides: 221 mg/dL — ABNORMAL HIGH (ref <150)

## 2017-09-16 LAB — FOLLICLE STIMULATING HORMONE: FSH: 3.1 m[IU]/mL

## 2017-09-19 LAB — PAP IG W/ RFLX HPV ASCU

## 2017-09-19 LAB — HUMAN PAPILLOMAVIRUS, HIGH RISK: HPV DNA HIGH RISK: NOT DETECTED

## 2017-09-20 ENCOUNTER — Encounter: Payer: Self-pay | Admitting: Gynecology

## 2017-09-27 ENCOUNTER — Other Ambulatory Visit: Payer: Self-pay | Admitting: Gynecology

## 2017-09-27 DIAGNOSIS — E7800 Pure hypercholesterolemia, unspecified: Secondary | ICD-10-CM

## 2017-10-02 ENCOUNTER — Other Ambulatory Visit: Payer: Managed Care, Other (non HMO)

## 2017-10-02 DIAGNOSIS — E7800 Pure hypercholesterolemia, unspecified: Secondary | ICD-10-CM

## 2017-10-02 LAB — LIPID PANEL
CHOLESTEROL: 235 mg/dL — AB (ref <200)
HDL: 60 mg/dL (ref >50)
LDL Cholesterol (Calc): 151 mg/dL (calc) — ABNORMAL HIGH
Non-HDL Cholesterol (Calc): 175 mg/dL (calc) — ABNORMAL HIGH (ref <130)
Total CHOL/HDL Ratio: 3.9 (calc) (ref <5.0)
Triglycerides: 120 mg/dL (ref <150)

## 2019-08-28 ENCOUNTER — Encounter: Payer: Self-pay | Admitting: Gynecology

## 2020-01-28 ENCOUNTER — Encounter: Payer: Managed Care, Other (non HMO) | Admitting: Women's Health

## 2020-01-29 ENCOUNTER — Encounter: Payer: Self-pay | Admitting: Women's Health

## 2020-01-29 ENCOUNTER — Other Ambulatory Visit: Payer: Self-pay

## 2020-01-29 ENCOUNTER — Ambulatory Visit (INDEPENDENT_AMBULATORY_CARE_PROVIDER_SITE_OTHER): Payer: Managed Care, Other (non HMO) | Admitting: Women's Health

## 2020-01-29 VITALS — BP 110/78 | Ht <= 58 in | Wt 122.0 lb

## 2020-01-29 DIAGNOSIS — Z13220 Encounter for screening for lipoid disorders: Secondary | ICD-10-CM

## 2020-01-29 DIAGNOSIS — Z01419 Encounter for gynecological examination (general) (routine) without abnormal findings: Secondary | ICD-10-CM

## 2020-01-29 LAB — CBC WITH DIFFERENTIAL/PLATELET
Absolute Monocytes: 566 cells/uL (ref 200–950)
Basophils Absolute: 52 cells/uL (ref 0–200)
Basophils Relative: 0.8 %
Eosinophils Absolute: 306 cells/uL (ref 15–500)
Eosinophils Relative: 4.7 %
HCT: 36.5 % (ref 35.0–45.0)
Hemoglobin: 11.5 g/dL — ABNORMAL LOW (ref 11.7–15.5)
Lymphs Abs: 2028 cells/uL (ref 850–3900)
MCH: 24.2 pg — ABNORMAL LOW (ref 27.0–33.0)
MCHC: 31.5 g/dL — ABNORMAL LOW (ref 32.0–36.0)
MCV: 76.8 fL — ABNORMAL LOW (ref 80.0–100.0)
MPV: 10 fL (ref 7.5–12.5)
Monocytes Relative: 8.7 %
Neutro Abs: 3549 cells/uL (ref 1500–7800)
Neutrophils Relative %: 54.6 %
Platelets: 311 10*3/uL (ref 140–400)
RBC: 4.75 10*6/uL (ref 3.80–5.10)
RDW: 14.3 % (ref 11.0–15.0)
Total Lymphocyte: 31.2 %
WBC: 6.5 10*3/uL (ref 3.8–10.8)

## 2020-01-29 LAB — COMPREHENSIVE METABOLIC PANEL
AG Ratio: 1.3 (calc) (ref 1.0–2.5)
ALT: 10 U/L (ref 6–29)
AST: 18 U/L (ref 10–35)
Albumin: 4.1 g/dL (ref 3.6–5.1)
Alkaline phosphatase (APISO): 94 U/L (ref 37–153)
BUN: 12 mg/dL (ref 7–25)
CO2: 27 mmol/L (ref 20–32)
Calcium: 9.5 mg/dL (ref 8.6–10.4)
Chloride: 98 mmol/L (ref 98–110)
Creat: 0.83 mg/dL (ref 0.50–1.05)
Globulin: 3.2 g/dL (calc) (ref 1.9–3.7)
Glucose, Bld: 97 mg/dL (ref 65–99)
Potassium: 4.6 mmol/L (ref 3.5–5.3)
Sodium: 136 mmol/L (ref 135–146)
Total Bilirubin: 0.4 mg/dL (ref 0.2–1.2)
Total Protein: 7.3 g/dL (ref 6.1–8.1)

## 2020-01-29 LAB — LIPID PANEL
Cholesterol: 232 mg/dL — ABNORMAL HIGH (ref <200)
HDL: 65 mg/dL (ref >=50)
LDL Cholesterol (Calc): 126 mg/dL (calc) — ABNORMAL HIGH
Non-HDL Cholesterol (Calc): 167 mg/dL (calc) — ABNORMAL HIGH (ref <130)
Total CHOL/HDL Ratio: 3.6 (calc) (ref <5.0)
Triglycerides: 266 mg/dL — ABNORMAL HIGH (ref <150)

## 2020-01-29 NOTE — Progress Notes (Signed)
Rayvn Bonsell 11-08-1971 [id number]    History:    Presents for annual exam.  Regular monthly cycle/withdraw.  2018 Pap ASCUS with negative high-risk HPV.  Has never had a mammogram.  Tdap 2017.  Has not had a screening colonoscopy, 2016 normal endoscopy.    Past medical history, past surgical history, family history and social history were all reviewed and documented in the EPIC chart.  History of migraine headaches on disability.  Primary care managing depression, ADD.  Has 4 children 65, 74, 22 do not live at home 70 year old son at home.  ROS:  A ROS was performed and pertinent positives and negatives are included.  Exam:  Vitals:   01/29/20 1541  BP: 110/78  Weight: 122 lb (55.3 kg)  Height: '4\' 10"'  (1.473 m)   Body mass index is 25.5 kg/m.   General appearance:  Normal Thyroid:  Symmetrical, normal in size, without palpable masses or nodularity. Respiratory  Auscultation:  Clear without wheezing or rhonchi Cardiovascular  Auscultation:  Regular rate, without rubs, murmurs or gallops  Edema/varicosities:  Not grossly evident Abdominal  Soft,nontender, without masses, guarding or rebound.  Liver/spleen:  No organomegaly noted  Hernia:  None appreciated  Skin  Inspection:  Grossly normal   Breasts: Examined lying and sitting.     Right: Without masses, retractions, discharge or axillary adenopathy.     Left: Without masses, retractions, discharge or axillary adenopathy. Gentitourinary   Inguinal/mons:  Normal without inguinal adenopathy  External genitalia:  Normal  BUS/Urethra/Skene's glands:  Normal  Vagina:  Normal  Cervix:  Normal  Uterus:  normal in size, shape and contour.  Midline and mobile  Adnexa/parametria:     Rt: Without masses or tenderness.   Lt: Without masses or tenderness.  Anus and perineum: Normal  Digital rectal exam: Normal sphincter tone without palpated masses or tenderness  Assessment/Plan:  49 y.o. MAF G5, P4 for annual exam with no complaints  of vaginal discharge, urinary symptoms, abdominal pain.  Monthly cycle/withdraw Anxiety/depression, ADHD, migraines -primary care manages meds  Plan: SBEs, annual screening mammogram, breast center information given instructed to schedule.  Aware of importance of active lifestyle, regular exercise encouraged.  Screening colonoscopy discussed instructed to call [facility] to schedule.  Has had a negative endoscopy.  Calcium rich foods, vitamin D 2000 IUs daily encouraged.  CBC, CMP, lipid panel, Pap with HR HPV typing, 2018 last Pap ASCUS with negative high-risk HPV.    [place], 4:31 PM 01/29/2020

## 2020-01-29 NOTE — Patient Instructions (Addendum)
Vitamin D 2000 IUs daily It was good to see you today Please schedule mammogram at the breast center,   corner of [address] [phone] Colonoscopy  [phone]  [facility]   gardasil  For samuel Human Papillomavirus Quadrivalent Vaccine suspension for injection What is this medicine? HUMAN PAPILLOMAVIRUS VACCINE (HYOO muhn pap uh LOH muh vahy ruhs vak SEEN) is a vaccine. It is used to prevent infections of four types of the human papillomavirus. In women, the vaccine may lower your risk of getting cervical, vaginal, vulvar, or anal cancer and genital warts. In men, the vaccine may lower your risk of getting genital warts and anal cancer. You cannot get these diseases from the vaccine. This vaccine does not treat these diseases. This medicine may be used for other purposes; ask your health care provider or pharmacist if you have questions. COMMON BRAND NAME(S): Gardasil What should I tell my health care provider before I take this medicine? They need to know if you have any of these conditions:  fever or infection  hemophilia  HIV infection or AIDS  immune system problems  low platelet count  an unusual reaction to Human Papillomavirus Vaccine, yeast, other medicines, foods, dyes, or preservatives  pregnant or trying to get pregnant  breast-feeding How should I use this medicine? This vaccine is for injection in a muscle on your upper arm or thigh. It is given by a health care professional. Dennis Bast will be observed for 15 minutes after each dose. Sometimes, fainting happens after the vaccine is given. You may be asked to sit or lie down during the 15 minutes. Three doses are given. The second dose is given 2 months after the first dose. The last dose is given 4 months after the second dose. A copy of a Vaccine Information Statement will be given before each vaccination. Read this sheet carefully each time. The sheet may change frequently. Talk to your pediatrician regarding  the use of this medicine in children. While this drug may be prescribed for children as Erika Rogers as 50 years of age for selected conditions, precautions do apply. Overdosage: If you think you have taken too much of this medicine contact a poison control center or emergency room at once. NOTE: This medicine is only for you. Do not share this medicine with others. What if I miss a dose? All 3 doses of the vaccine should be given within 6 months. Remember to keep appointments for follow-up doses. Your health care provider will tell you when to return for the next vaccine. Ask your health care professional for advice if you are unable to keep an appointment or miss a scheduled dose. What may interact with this medicine?  other vaccines This list may not describe all possible interactions. Give your health care provider a list of all the medicines, herbs, non-prescription drugs, or dietary supplements you use. Also tell them if you smoke, drink alcohol, or use illegal drugs. Some items may interact with your medicine. What should I watch for while using this medicine? This vaccine may not fully protect everyone. Continue to have regular pelvic exams and cervical or anal cancer screenings as directed by your doctor. The Human Papillomavirus is a sexually transmitted disease. It can be passed by any kind of sexual activity that involves genital contact. The vaccine works best when given before you have any contact with the virus. Many people who have the virus do not have any signs or symptoms. Tell your doctor or health care  professional if you have any reaction or unusual symptom after getting the vaccine. What side effects may I notice from receiving this medicine? Side effects that you should report to your doctor or health care professional as soon as possible:  allergic reactions like skin rash, itching or hives, swelling of the face, lips, or tongue  breathing problems  feeling faint or lightheaded,  falls Side effects that usually do not require medical attention (report to your doctor or health care professional if they continue or are bothersome):  cough  dizziness  fever  headache  nausea  redness, warmth, swelling, pain, or itching at site where injected This list may not describe all possible side effects. Call your doctor for medical advice about side effects. You may report side effects to FDA at 1-800-FDA-1088. Where should I keep my medicine? This drug is given in a hospital or clinic and will not be stored at home. NOTE: This sheet is a summary. It may not cover all possible information. If you have questions about this medicine, talk to your doctor, pharmacist, or health care provider.  2020 Elsevier/Gold Standard (2014-01-13 13:14:33)  Health Maintenance, Female Adopting a healthy lifestyle and getting preventive care are important in promoting health and wellness. Ask your health care provider about:  The right schedule for you to have regular tests and exams.  Things you can do on your own to prevent diseases and keep yourself healthy. What should I know about diet, weight, and exercise? Eat a healthy diet   Eat a diet that includes plenty of vegetables, fruits, low-fat dairy products, and lean protein.  Do not eat a lot of foods that are high in solid fats, added sugars, or sodium. Maintain a healthy weight Body mass index (BMI) is used to identify weight problems. It estimates body fat based on height and weight. Your health care provider can help determine your BMI and help you achieve or maintain a healthy weight. Get regular exercise Get regular exercise. This is one of the most important things you can do for your health. Most adults should:  Exercise for at least 150 minutes each week. The exercise should increase your heart rate and make you sweat (moderate-intensity exercise).  Do strengthening exercises at least twice a week. This is in addition  to the moderate-intensity exercise.  Spend less time sitting. Even light physical activity can be beneficial. Watch cholesterol and blood lipids Have your blood tested for lipids and cholesterol at 50 years of age, then have this test every 5 years. Have your cholesterol levels checked more often if:  Your lipid or cholesterol levels are high.  You are older than 50 years of age.  You are at high risk for heart disease. What should I know about cancer screening? Depending on your health history and family history, you may need to have cancer screening at various ages. This may include screening for:  Breast cancer.  Cervical cancer.  Colorectal cancer.  Skin cancer.  Lung cancer. What should I know about heart disease, diabetes, and high blood pressure? Blood pressure and heart disease  High blood pressure causes heart disease and increases the risk of stroke. This is more likely to develop in people who have high blood pressure readings, are of African descent, or are overweight.  Have your blood pressure checked: ? Every 3-5 years if you are 36-52 years of age. ? Every year if you are 27 years old or older. Diabetes Have regular diabetes screenings. This checks  your fasting blood sugar level. Have the screening done:  Once every three years after age 35 if you are at a normal weight and have a low risk for diabetes.  More often and at a younger age if you are overweight or have a high risk for diabetes. What should I know about preventing infection? Hepatitis B If you have a higher risk for hepatitis B, you should be screened for this virus. Talk with your health care provider to find out if you are at risk for hepatitis B infection. Hepatitis C Testing is recommended for:  Everyone born from [birth year] through 1966.  Anyone with known risk factors for hepatitis C. Sexually transmitted infections (STIs)  Get screened for STIs, including gonorrhea and chlamydia, if: ? You  are sexually active and are younger than 50 years of age. ? You are older than 50 years of age and your health care provider tells you that you are at risk for this type of infection. ? Your sexual activity has changed since you were last screened, and you are at increased risk for chlamydia or gonorrhea. Ask your health care provider if you are at risk.  Ask your health care provider about whether you are at high risk for HIV. Your health care provider may recommend a prescription medicine to help prevent HIV infection. If you choose to take medicine to prevent HIV, you should first get tested for HIV. You should then be tested every 3 months for as long as you are taking the medicine. Pregnancy  If you are about to stop having your period (premenopausal) and you may become pregnant, seek counseling before you get pregnant.  Take 400 to 800 micrograms (mcg) of folic acid every day if you become pregnant.  Ask for birth control (contraception) if you want to prevent pregnancy. Osteoporosis and menopause Osteoporosis is a disease in which the bones lose minerals and strength with aging. This can result in bone fractures. If you are 30 years old or older, or if you are at risk for osteoporosis and fractures, ask your health care provider if you should:  Be screened for bone loss.  Take a calcium or vitamin D supplement to lower your risk of fractures.  Be given hormone replacement therapy (HRT) to treat symptoms of menopause. Follow these instructions at home: Lifestyle  Do not use any products that contain nicotine or tobacco, such as cigarettes, e-cigarettes, and chewing tobacco. If you need help quitting, ask your health care provider.  Do not use street drugs.  Do not share needles.  Ask your health care provider for help if you need support or information about quitting drugs. Alcohol use  Do not drink alcohol if: ? Your health care provider tells you not to drink. ? You are  pregnant, may be pregnant, or are planning to become pregnant.  If you drink alcohol: ? Limit how much you use to 0-1 drink a day. ? Limit intake if you are breastfeeding.  Be aware of how much alcohol is in your drink. In the [place] one drink equals one 12 oz bottle of beer (355 mL), one 5 oz glass of wine (148 mL), or one 1 oz glass of hard liquor (44 mL). General instructions  Schedule regular health, dental, and eye exams.  Stay current with your vaccines.  Tell your health care provider if: ? You often feel depressed. ? You have ever been abused or do not feel safe at home. Summary  Adopting a healthy  lifestyle and getting preventive care are important in promoting health and wellness.  Follow your health care provider's instructions about healthy diet, exercising, and getting tested or screened for diseases.  Follow your health care provider's instructions on monitoring your cholesterol and blood pressure. This information is not intended to replace advice given to you by your health care provider. Make sure you discuss any questions you have with your health care provider. Document Revised: 11/14/2018 Document Reviewed: 11/14/2018 Elsevier Patient Education  2020 [employer].

## 2020-01-30 LAB — URINALYSIS, COMPLETE W/RFL CULTURE
Bacteria, UA: NONE SEEN /HPF
Bilirubin Urine: NEGATIVE
Glucose, UA: NEGATIVE
Hgb urine dipstick: NEGATIVE
Hyaline Cast: NONE SEEN /LPF
Ketones, ur: NEGATIVE
Leukocyte Esterase: NEGATIVE
Nitrites, Initial: NEGATIVE
Protein, ur: NEGATIVE
RBC / HPF: NONE SEEN /HPF (ref 0–2)
Specific Gravity, Urine: 1.012 (ref 1.001–1.03)
Squamous Epithelial / LPF: NONE SEEN /HPF (ref <=5)
WBC, UA: NONE SEEN /HPF (ref 0–5)
pH: 7 (ref 5.0–8.0)

## 2020-01-30 LAB — PAP, TP IMAGING W/ HPV RNA, RFLX HPV TYPE 16,18/45: HPV DNA High Risk: NOT DETECTED

## 2020-01-30 LAB — NO CULTURE INDICATED

## 2020-02-04 ENCOUNTER — Other Ambulatory Visit: Payer: Self-pay

## 2020-02-04 DIAGNOSIS — E782 Mixed hyperlipidemia: Secondary | ICD-10-CM

## 2020-05-12 ENCOUNTER — Other Ambulatory Visit: Payer: Self-pay

## 2020-05-12 ENCOUNTER — Ambulatory Visit: Payer: Managed Care, Other (non HMO) | Admitting: Registered Nurse

## 2020-05-12 ENCOUNTER — Encounter: Payer: Self-pay | Admitting: Registered Nurse

## 2020-05-12 VITALS — BP 137/87 | HR 103 | Temp 97.6°F | Resp 17 | Ht <= 58 in | Wt 123.6 lb

## 2020-05-12 DIAGNOSIS — F988 Other specified behavioral and emotional disorders with onset usually occurring in childhood and adolescence: Secondary | ICD-10-CM | POA: Diagnosis not present

## 2020-05-12 DIAGNOSIS — F329 Major depressive disorder, single episode, unspecified: Secondary | ICD-10-CM

## 2020-05-12 DIAGNOSIS — F419 Anxiety disorder, unspecified: Secondary | ICD-10-CM | POA: Diagnosis not present

## 2020-05-12 DIAGNOSIS — J302 Other seasonal allergic rhinitis: Secondary | ICD-10-CM

## 2020-05-12 MED ORDER — FLUOXETINE HCL 40 MG PO CAPS: 40.0000 mg | ORAL_CAPSULE | Freq: Every day | ORAL | 0 refills | Status: DC

## 2020-05-12 MED ORDER — ALPRAZOLAM XR 2 MG PO TB24: 2.0000 mg | ORAL_TABLET | Freq: Every day | ORAL | 0 refills | Status: DC

## 2020-05-12 MED ORDER — ATOMOXETINE HCL 100 MG PO CAPS: 100.0000 mg | ORAL_CAPSULE | Freq: Every morning | ORAL | 2 refills | Status: DC

## 2020-05-12 MED ORDER — BUSPIRONE HCL 30 MG PO TABS: 30.0000 mg | ORAL_TABLET | Freq: Two times a day (BID) | ORAL | 2 refills | Status: DC

## 2020-05-12 MED ORDER — LISDEXAMFETAMINE DIMESYLATE 40 MG PO CAPS: 40.0000 mg | ORAL_CAPSULE | ORAL | 0 refills | Status: DC

## 2020-05-12 MED ORDER — CETIRIZINE HCL 10 MG PO TABS: 10.0000 mg | ORAL_TABLET | ORAL | 0 refills | Status: DC | PRN

## 2020-05-12 MED ORDER — VYVANSE 40 MG PO CAPS: 40.0000 mg | ORAL_CAPSULE | Freq: Every morning | ORAL | 0 refills | Status: DC

## 2020-05-12 MED ORDER — ALPRAZOLAM ER 1 MG PO TB24: 1.0000 mg | ORAL_TABLET | Freq: Every day | ORAL | 0 refills | Status: DC
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
## 2020-05-12 NOTE — Patient Instructions (Signed)
° ° ° °  If you have lab work done today you will be contacted with your lab results within the next 2 weeks.  If you have not heard from us then please contact us. The fastest way to get your results is to register for My Chart. ° ° °IF you received an x-ray today, you will receive an invoice from [facility]. Please contact [facility] at [phone] with questions or concerns regarding your invoice.  ° °IF you received labwork today, you will receive an invoice from [facility]. Please contact [facility] at [phone] with questions or concerns regarding your invoice.  ° °Our billing staff will not be able to assist you with questions regarding bills from these companies. ° °You will be contacted with the lab results as soon as they are available. The fastest way to get your results is to activate your My Chart account. Instructions are located on the last page of this paperwork. If you have not heard from us regarding the results in 2 weeks, please contact this office. °  ° ° ° °

## 2020-07-06 ENCOUNTER — Other Ambulatory Visit: Payer: Self-pay | Admitting: Registered Nurse

## 2020-07-06 DIAGNOSIS — F329 Major depressive disorder, single episode, unspecified: Secondary | ICD-10-CM

## 2020-07-06 DIAGNOSIS — F419 Anxiety disorder, unspecified: Secondary | ICD-10-CM

## 2020-07-06 NOTE — Telephone Encounter (Signed)
Patient is requesting a refill of the following medications: Requested Prescriptions   Pending Prescriptions Disp Refills  . ALPRAZOLAM XR 1 MG 24 hr tablet [Pharmacy Med Name: ALPRAZolam XR 1 MG TABLET] 30 tablet     Sig: TAKE ONE TABLET BY MOUTH DAILY    Date of patient request: 07/06/2020 Last office visit: 05/12/2020 Date of last refill: 06/09/2020 Last refill amount: 30 tablets Follow up time period per chart: 08/11/2020

## 2020-08-11 ENCOUNTER — Other Ambulatory Visit: Payer: Self-pay

## 2020-08-11 ENCOUNTER — Ambulatory Visit (INDEPENDENT_AMBULATORY_CARE_PROVIDER_SITE_OTHER): Payer: Managed Care, Other (non HMO) | Admitting: Registered Nurse

## 2020-08-11 DIAGNOSIS — Z13220 Encounter for screening for lipoid disorders: Secondary | ICD-10-CM

## 2020-08-11 LAB — LIPID PANEL
Chol/HDL Ratio: 3.3 ratio (ref 0.0–4.4)
Cholesterol, Total: 198 mg/dL (ref 100–199)
HDL: 60 mg/dL (ref >39)
LDL Chol Calc (NIH): 115 mg/dL — ABNORMAL HIGH (ref 0–99)
Triglycerides: 129 mg/dL (ref 0–149)
VLDL Cholesterol Cal: 23 mg/dL (ref 5–40)

## 2020-08-14 ENCOUNTER — Encounter: Payer: Self-pay | Admitting: Radiology

## 2020-08-14 NOTE — Progress Notes (Signed)
Good afternoon,  Normal results letter, please!  Thank you  Jari Sportsman, NP

## 2020-08-25 ENCOUNTER — Other Ambulatory Visit: Payer: Managed Care, Other (non HMO)

## 2020-08-25 ENCOUNTER — Other Ambulatory Visit: Payer: Self-pay

## 2020-08-25 DIAGNOSIS — E782 Mixed hyperlipidemia: Secondary | ICD-10-CM

## 2020-08-25 LAB — LIPID PANEL
Cholesterol: 196 mg/dL (ref <200)
HDL: 51 mg/dL (ref >=50)
LDL Cholesterol (Calc): 105 mg/dL (calc) — ABNORMAL HIGH
Non-HDL Cholesterol (Calc): 145 mg/dL (calc) — ABNORMAL HIGH (ref <130)
Total CHOL/HDL Ratio: 3.8 (calc) (ref <5.0)
Triglycerides: 281 mg/dL — ABNORMAL HIGH (ref <150)

## 2020-09-15 ENCOUNTER — Ambulatory Visit: Payer: Managed Care, Other (non HMO) | Admitting: Registered Nurse

## 2020-09-15 ENCOUNTER — Encounter: Payer: Self-pay | Admitting: Registered Nurse

## 2020-09-15 ENCOUNTER — Other Ambulatory Visit: Payer: Self-pay

## 2020-09-15 VITALS — BP 111/61 | HR 95 | Temp 98.0°F | Ht 63.0 in | Wt 119.0 lb

## 2020-09-15 DIAGNOSIS — F32A Depression, unspecified: Secondary | ICD-10-CM

## 2020-09-15 DIAGNOSIS — F419 Anxiety disorder, unspecified: Secondary | ICD-10-CM

## 2020-09-15 DIAGNOSIS — Z1231 Encounter for screening mammogram for malignant neoplasm of breast: Secondary | ICD-10-CM

## 2020-09-15 DIAGNOSIS — Z23 Encounter for immunization: Secondary | ICD-10-CM | POA: Diagnosis not present

## 2020-09-15 DIAGNOSIS — F988 Other specified behavioral and emotional disorders with onset usually occurring in childhood and adolescence: Secondary | ICD-10-CM | POA: Diagnosis not present

## 2020-09-15 MED ORDER — ALPRAZOLAM ER 1 MG PO TB24: 1.0000 mg | ORAL_TABLET | Freq: Every day | ORAL | 0 refills | Status: DC | PRN

## 2020-09-15 MED ORDER — FLUOXETINE HCL 40 MG PO CAPS: 40.0000 mg | ORAL_CAPSULE | Freq: Every day | ORAL | 3 refills | Status: DC

## 2020-09-15 MED ORDER — ATOMOXETINE HCL 100 MG PO CAPS: 100.0000 mg | ORAL_CAPSULE | Freq: Every morning | ORAL | 2 refills | Status: DC

## 2020-09-15 NOTE — Progress Notes (Signed)
Established Patient Office Visit  Subjective:  Patient ID: Erika Rogers, female    DOB: 03/03/1971  Age: 49 y.o. MRN: [id number]  CC:  Chief Complaint  Patient presents with  . Medication Refill    Anxiety/depression    HPI Erika Rogers presents for med refill  No complaints or concerns. Feeling well. Wants to talk about how long she will be on the prozac, strattera, and the alprazolam. Discussed that benzodiazepines are generally not preferred for long term use and that hopefully as stress winds down she will need to use it less frequently.  Denies AEs  Labs reviewed - lipids greatly improved  Flu shot to be given today  Ordered mammogram as pt is due.   Past Medical History:  Diagnosis Date  . Allergy   . ASCUS of cervix with negative high risk HPV 09/2017  . Depression   . Migraine     Past Surgical History:  Procedure Laterality Date  . APPENDECTOMY    . ESOPHAGOGASTRODUODENOSCOPY N/A 05/03/2015   Procedure: ESOPHAGOGASTRODUODENOSCOPY (EGD);  Surgeon: Beverley Fiedler, MD;  Location: [facility] ENDOSCOPY;  Service: Endoscopy;  Laterality: N/A;  . NASAL SINUS SURGERY    . TONSILLECTOMY      Family History  Problem Relation Age of Onset  . Diabetes Brother   . Mental illness Brother     Social History   Socioeconomic History  . Marital status: Married    Spouse name: Not on file  . Number of children: 4  . Years of education: Not on file  . Highest education level: Not on file  Occupational History  . Not on file  Tobacco Use  . Smoking status: Never Smoker  . Smokeless tobacco: Never Used  Vaping Use  . Vaping Use: Never used  Substance and Sexual Activity  . Alcohol use: No    Alcohol/week: 0.0 standard drinks  . Drug use: No  . Sexual activity: Yes    Birth control/protection: None    Comment: 1st intercourse 56 yo-1 partner  Other Topics Concern  . Not on file  Social History Narrative   Single. Education: Grade School. Exercise: walks 2 times a week for  2 hours.   Social Determinants of Health   Financial Resource Strain:   . Difficulty of Paying Living Expenses: Not on file  Food Insecurity:   . Worried About Programme researcher, broadcasting/film/video in the Last Year: Not on file  . Ran Out of Food in the Last Year: Not on file  Transportation Needs:   . Lack of Transportation (Medical): Not on file  . Lack of Transportation (Non-Medical): Not on file  Physical Activity:   . Days of Exercise per Week: Not on file  . Minutes of Exercise per Session: Not on file  Stress:   . Feeling of Stress : Not on file  Social Connections:   . Frequency of Communication with Friends and Family: Not on file  . Frequency of Social Gatherings with Friends and Family: Not on file  . Attends Religious Services: Not on file  . Active Member of Clubs or Organizations: Not on file  . Attends Banker Meetings: Not on file  . Marital Status: Not on file  Intimate Partner Violence:   . Fear of Current or Ex-Partner: Not on file  . Emotionally Abused: Not on file  . Physically Abused: Not on file  . Sexually Abused: Not on file    Outpatient Medications Prior to Visit  Medication  Sig Dispense Refill  . ALPRAZOLAM XR 2 MG 24 hr tablet Take 1 tablet (2 mg total) by mouth daily. 30 tablet 0  . busPIRone (BUSPAR) 30 MG tablet Take 1 tablet (30 mg total) by mouth 2 (two) times daily. 60 tablet 2  . cetirizine (ZYRTEC) 10 MG tablet Take 1 tablet (10 mg total) by mouth as needed for allergies. 90 tablet 0  . lisdexamfetamine (VYVANSE) 40 MG capsule Take 1 capsule (40 mg total) by mouth every morning. 30 capsule 0  . VYVANSE 40 MG capsule Take 1 capsule (40 mg total) by mouth every morning. 30 capsule 0  . ALPRAZOLAM XR 1 MG 24 hr tablet TAKE ONE TABLET BY MOUTH DAILY 30 tablet 0  . atomoxetine (STRATTERA) 100 MG capsule Take 1 capsule (100 mg total) by mouth every morning. 30 capsule 2  . FLUoxetine (PROZAC) 40 MG capsule Take 1 capsule (40 mg total) by mouth daily.  90 capsule 0   No facility-administered medications prior to visit.    Allergies  Allergen Reactions  . Amoxicillin Itching and Rash    ROS Review of Systems  Constitutional: Negative.   HENT: Negative.   Eyes: Negative.   Respiratory: Negative.   Cardiovascular: Negative.   Gastrointestinal: Negative.   Genitourinary: Negative.   Musculoskeletal: Negative.   Skin: Negative.   Neurological: Negative.   Psychiatric/Behavioral: Negative.       Objective:    Physical Exam Vitals and nursing note reviewed.  Constitutional:      General: She is not in acute distress.    Appearance: Normal appearance. She is normal weight. She is not ill-appearing, toxic-appearing or diaphoretic.  Cardiovascular:     Rate and Rhythm: Normal rate and regular rhythm.     Heart sounds: Normal heart sounds. No murmur heard.  No friction rub. No gallop.   Pulmonary:     Effort: Pulmonary effort is normal. No respiratory distress.     Breath sounds: Normal breath sounds. No stridor. No wheezing, rhonchi or rales.  Chest:     Chest wall: No tenderness.  Skin:    General: Skin is warm and dry.  Neurological:     General: No focal deficit present.     Mental Status: She is alert and oriented to person, place, and time. Mental status is at baseline.  Psychiatric:        Mood and Affect: Mood normal.        Behavior: Behavior normal.        Thought Content: Thought content normal.        Judgment: Judgment normal.     BP 111/61 (BP Location: Right Arm, Patient Position: Sitting, Cuff Size: Normal)   Pulse 95   Temp 98 F (36.7 C) (Temporal)   Ht 5\' 3"  (1.6 m)   Wt 119 lb (54 kg)   LMP 09/07/2020   SpO2 96%   BMI 21.08 kg/m  Wt Readings from Last 3 Encounters:  09/15/20 119 lb (54 kg)  05/12/20 123 lb 9.6 oz (56.1 kg)  01/29/20 122 lb (55.3 kg)     Health Maintenance Due  Topic Date Due  . Hepatitis C Screening  Never done  . HIV Screening  Never done  . MAMMOGRAM  Never done   . COLONOSCOPY  Never done    There are no preventive care reminders to display for this patient.  Lab Results  Component Value Date   TSH 1.99 09/15/2017   Lab Results  Component Value  Date   WBC 6.5 01/29/2020   HGB 11.5 (L) 01/29/2020   HCT 36.5 01/29/2020   MCV 76.8 (L) 01/29/2020   PLT 311 01/29/2020   Lab Results  Component Value Date   NA 136 01/29/2020   K 4.6 01/29/2020   CO2 27 01/29/2020   GLUCOSE 97 01/29/2020   BUN 12 01/29/2020   CREATININE 0.83 01/29/2020   BILITOT 0.4 01/29/2020   ALKPHOS 78 05/02/2015   AST 18 01/29/2020   ALT 10 01/29/2020   PROT 7.3 01/29/2020   ALBUMIN 4.3 05/02/2015   CALCIUM 9.5 01/29/2020   ANIONGAP 7 05/05/2015   Lab Results  Component Value Date   CHOL 196 08/25/2020   Lab Results  Component Value Date   HDL 51 08/25/2020   Lab Results  Component Value Date   LDLCALC 105 (H) 08/25/2020   Lab Results  Component Value Date   TRIG 281 (H) 08/25/2020   Lab Results  Component Value Date   CHOLHDL 3.8 08/25/2020   No results found for: HGBA1C    Assessment & Plan:   Problem List Items Addressed This Visit    None    Visit Diagnoses    Need for prophylactic vaccination and inoculation against influenza    -  Primary   Relevant Orders   Flu Vaccine QUAD 36+ mos IM (Completed)   Attention deficit disorder, unspecified hyperactivity presence       Relevant Medications   atomoxetine (STRATTERA) 100 MG capsule   Anxiety and depression       Relevant Medications   ALPRAZolam (ALPRAZOLAM XR) 1 MG 24 hr tablet   FLUoxetine (PROZAC) 40 MG capsule   Encounter for screening mammogram for malignant neoplasm of breast       Relevant Orders   MM Digital Diagnostic Bilat      Meds ordered this encounter  Medications  . atomoxetine (STRATTERA) 100 MG capsule    Sig: Take 1 capsule (100 mg total) by mouth every morning.    Dispense:  30 capsule    Refill:  2  . ALPRAZolam (ALPRAZOLAM XR) 1 MG 24 hr tablet    Sig:  Take 1 tablet (1 mg total) by mouth daily as needed for anxiety.    Dispense:  30 tablet    Refill:  0  . FLUoxetine (PROZAC) 40 MG capsule    Sig: Take 1 capsule (40 mg total) by mouth daily.    Dispense:  90 capsule    Refill:  3    Follow-up: No follow-ups on file.   PLAN  Refill meds x 3 mo supply for alprazolam and strattera. Fluoxetine ok to fill for 1 year  Return in 3-6 months  Will repeat labs at that time  Patient encouraged to call clinic with any questions, comments, or concerns.   Janeece Agee, NP

## 2020-09-15 NOTE — Patient Instructions (Signed)
° ° ° °  If you have lab work done today you will be contacted with your lab results within the next 2 weeks.  If you have not heard from us then please contact us. The fastest way to get your results is to register for My Chart. ° ° °IF you received an x-ray today, you will receive an invoice from [facility]. Please contact [facility] at [phone] with questions or concerns regarding your invoice.  ° °IF you received labwork today, you will receive an invoice from [facility]. Please contact [facility] at [phone] with questions or concerns regarding your invoice.  ° °Our billing staff will not be able to assist you with questions regarding bills from these companies. ° °You will be contacted with the lab results as soon as they are available. The fastest way to get your results is to activate your My Chart account. Instructions are located on the last page of this paperwork. If you have not heard from us regarding the results in 2 weeks, please contact this office. °  ° ° ° °

## 2020-11-06 ENCOUNTER — Other Ambulatory Visit: Payer: Self-pay | Admitting: Registered Nurse

## 2020-11-06 DIAGNOSIS — F419 Anxiety disorder, unspecified: Secondary | ICD-10-CM

## 2020-11-06 NOTE — Telephone Encounter (Signed)
Requested medication (s) are due for refill today:   Provider to determine  Requested medication (s) are on the active medication list:   Yes  Future visit scheduled:   Yes in 1 mo.   Last ordered: 05/12/2020 #30, 0 refills   Requested Prescriptions  Pending Prescriptions Disp Refills   ALPRAZOLAM XR 1 MG 24 hr tablet [Pharmacy Med Name: ALPRAZolam XR 1 MG TABLET] 30 tablet     Sig: TAKE ONE TABLET BY MOUTH DAILY AS NEEDED FOR ANXIETY      Not Delegated - Psychiatry:  Anxiolytics/Hypnotics Failed - 11/06/2020  1:57 PM      Failed - This refill cannot be delegated      Failed - Urine Drug Screen completed in last 360 days      Passed - Valid encounter within last 6 months    Recent Outpatient Visits           1 month ago Need for prophylactic vaccination and inoculation against influenza   [facility], Gerlene Burdock, NP   5 months ago Anxiety and depression   [facility], Gerlene Burdock, NP   5 years ago Fever, unspecified   [facility] at [facility], Gwenlyn Found, MD   5 years ago Vaccine for tetanus toxoid   [facility] at [facility], Harrel Lemon, MD   5 years ago Need for Tdap vaccination   [facility] at [facility], Hennie Duos       Future Appointments             In 1 month Janeece Agee, NP [facility] [place], [facility]

## 2020-11-06 NOTE — Telephone Encounter (Signed)
Patient is requesting a refill of the following medications: Requested Prescriptions   Pending Prescriptions Disp Refills   ALPRAZOLAM XR 1 MG 24 hr tablet [Pharmacy Med Name: ALPRAZolam XR 1 MG TABLET] 30 tablet     Sig: TAKE ONE TABLET BY MOUTH DAILY AS NEEDED FOR ANXIETY    Date of patient request: 11/06/2020 Last office visit: 09/15/2020 Date of last refill: 09/15/2020 Last refill amount: 30

## 2020-11-09 ENCOUNTER — Ambulatory Visit: Payer: Managed Care, Other (non HMO) | Admitting: Registered Nurse

## 2020-11-10 ENCOUNTER — Ambulatory Visit: Payer: Managed Care, Other (non HMO) | Admitting: Registered Nurse

## 2020-11-10 ENCOUNTER — Other Ambulatory Visit: Payer: Self-pay

## 2020-11-10 ENCOUNTER — Encounter: Payer: Self-pay | Admitting: Registered Nurse

## 2020-11-10 VITALS — BP 131/71 | HR 79 | Temp 98.0°F | Resp 18 | Ht 63.0 in | Wt 118.0 lb

## 2020-11-10 DIAGNOSIS — J302 Other seasonal allergic rhinitis: Secondary | ICD-10-CM

## 2020-11-10 DIAGNOSIS — J3489 Other specified disorders of nose and nasal sinuses: Secondary | ICD-10-CM | POA: Diagnosis not present

## 2020-11-10 MED ORDER — AZITHROMYCIN 250 MG PO TABS: ORAL_TABLET | ORAL | 0 refills | Status: DC

## 2020-11-10 MED ORDER — TRAMADOL HCL 50 MG PO TABS: 50.0000 mg | ORAL_TABLET | Freq: Every evening | ORAL | 0 refills | Status: AC | PRN

## 2020-11-10 MED ORDER — METHYLPREDNISOLONE ACETATE 80 MG/ML IJ SUSP
80.0000 mg | Freq: Once | INTRAMUSCULAR | Status: AC
  Administered 2020-11-10: 80 mg via INTRAMUSCULAR

## 2020-11-10 NOTE — Patient Instructions (Signed)
° ° ° °  If you have lab work done today you will be contacted with your lab results within the next 2 weeks.  If you have not heard from us then please contact us. The fastest way to get your results is to register for My Chart. ° ° °IF you received an x-ray today, you will receive an invoice from [facility]. Please contact [facility] at [phone] with questions or concerns regarding your invoice.  ° °IF you received labwork today, you will receive an invoice from [facility]. Please contact [facility] at [phone] with questions or concerns regarding your invoice.  ° °Our billing staff will not be able to assist you with questions regarding bills from these companies. ° °You will be contacted with the lab results as soon as they are available. The fastest way to get your results is to activate your My Chart account. Instructions are located on the last page of this paperwork. If you have not heard from us regarding the results in 2 weeks, please contact this office. °  ° ° ° °

## 2020-11-10 NOTE — Progress Notes (Signed)
Acute Office Visit  Subjective:    Patient ID: Erika Rogers, female    DOB: 09-04-1971, 49 y.o.   MRN: [id number]  Chief Complaint  Patient presents with  . Headache    Patient states she has been having an headache for the past 2 weeks. Per patient she has been taking alot of medications but it does not seem to help and no other concerns    HPI Patient is in today for sinus pressure and facial headache  Onset two weeks ago. Worsening. Hx of allergies - feels that this is causing sinus pressure and headache. Pain has been intense. Using claritin and flonase with no relief. Notes that at times she has some blood from nose - denies mucus or pnd, no cough, no lower respiratory symptoms, no neuro symptoms, no sick contacts.  Symptoms immediately worse when outside or exposed to cold air  Past Medical History:  Diagnosis Date  . Allergy   . ASCUS of cervix with negative high risk HPV 09/2017  . Depression   . Migraine     Past Surgical History:  Procedure Laterality Date  . APPENDECTOMY    . ESOPHAGOGASTRODUODENOSCOPY N/A 05/03/2015   Procedure: ESOPHAGOGASTRODUODENOSCOPY (EGD);  Surgeon: Beverley Fiedler, MD;  [facility]: [facility] ENDOSCOPY;  Service: Endoscopy;  Laterality: N/A;  . NASAL SINUS SURGERY    . TONSILLECTOMY      Family History  Problem Relation Age of Onset  . Diabetes Brother   . Mental illness Brother     Social History   Socioeconomic History  . Marital status: Married    Spouse name: Not on file  . Number of children: 4  . Years of education: Not on file  . Highest education level: Not on file  Occupational History  . Not on file  Tobacco Use  . Smoking status: Never Smoker  . Smokeless tobacco: Never Used  Vaping Use  . Vaping Use: Never used  Substance and Sexual Activity  . Alcohol use: No    Alcohol/week: 0.0 standard drinks  . Drug use: No  . Sexual activity: Yes    Birth control/protection: None    Comment: 1st intercourse 17 yo-1 partner  Other  Topics Concern  . Not on file  Social History Narrative   Single. Education: Grade School. Exercise: walks 2 times a week for 2 hours.   Social Determinants of Health   Financial Resource Strain:   . Difficulty of Paying Living Expenses: Not on file  Food Insecurity:   . Worried About Programme researcher, broadcasting/film/video in the Last Year: Not on file  . Ran Out of Food in the Last Year: Not on file  Transportation Needs:   . Lack of Transportation (Medical): Not on file  . Lack of Transportation (Non-Medical): Not on file  Physical Activity:   . Days of Exercise per Week: Not on file  . Minutes of Exercise per Session: Not on file  Stress:   . Feeling of Stress : Not on file  Social Connections:   . Frequency of Communication with Friends and Family: Not on file  . Frequency of Social Gatherings with Friends and Family: Not on file  . Attends Religious Services: Not on file  . Active Member of Clubs or Organizations: Not on file  . Attends Banker Meetings: Not on file  . Marital Status: Not on file  Intimate Partner Violence:   . Fear of Current or Ex-Partner: Not on file  . Emotionally  Abused: Not on file  . Physically Abused: Not on file  . Sexually Abused: Not on file    Outpatient Medications Prior to Visit  Medication Sig Dispense Refill  . ALPRAZOLAM XR 1 MG 24 hr tablet TAKE ONE TABLET BY MOUTH DAILY AS NEEDED FOR ANXIETY 30 tablet 0  . ALPRAZOLAM XR 2 MG 24 hr tablet Take 1 tablet (2 mg total) by mouth daily. 30 tablet 0  . atomoxetine (STRATTERA) 100 MG capsule Take 1 capsule (100 mg total) by mouth every morning. 30 capsule 2  . busPIRone (BUSPAR) 30 MG tablet Take 1 tablet (30 mg total) by mouth 2 (two) times daily. 60 tablet 2  . cetirizine (ZYRTEC) 10 MG tablet Take 1 tablet (10 mg total) by mouth as needed for allergies. 90 tablet 0  . FLUoxetine (PROZAC) 40 MG capsule Take 1 capsule (40 mg total) by mouth daily. 90 capsule 3  . lisdexamfetamine (VYVANSE) 40 MG  capsule Take 1 capsule (40 mg total) by mouth every morning. 30 capsule 0  . VYVANSE 40 MG capsule Take 1 capsule (40 mg total) by mouth every morning. 30 capsule 0   No facility-administered medications prior to visit.    Allergies  Allergen Reactions  . Amoxicillin Itching and Rash    Review of Systems  Constitutional: Negative.   HENT: Negative.   Eyes: Negative.   Respiratory: Negative.   Cardiovascular: Negative.   Gastrointestinal: Negative.   Genitourinary: Negative.   Musculoskeletal: Negative.   Skin: Negative.   Neurological: Negative.   Psychiatric/Behavioral: Negative.   All other systems reviewed and are negative.      Objective:    Physical Exam Vitals and nursing note reviewed.  Constitutional:      Appearance: She is well-developed and normal weight.  HENT:     Nose: Nasal tenderness present.     Right Nostril: No foreign body or occlusion.     Left Nostril: No foreign body or occlusion.     Right Turbinates: Enlarged and swollen.     Left Turbinates: Enlarged and swollen.     Right Sinus: Maxillary sinus tenderness and frontal sinus tenderness present.     Left Sinus: Maxillary sinus tenderness and frontal sinus tenderness present.  Neurological:     Mental Status: She is alert and oriented to person, place, and time. Mental status is at baseline.     GCS: GCS eye subscore is 4. GCS verbal subscore is 5. GCS motor subscore is 6.  Psychiatric:        Mood and Affect: Mood normal.        Speech: Speech normal.        Behavior: Behavior normal.     BP 131/71   Pulse 79   Temp 98 F (36.7 C) (Temporal)   Resp 18   Ht 5\' 3"  (1.6 m)   Wt 118 lb (53.5 kg)   SpO2 100%   BMI 20.90 kg/m  Wt Readings from Last 3 Encounters:  11/10/20 118 lb (53.5 kg)  09/15/20 119 lb (54 kg)  05/12/20 123 lb 9.6 oz (56.1 kg)    Health Maintenance Due  Topic Date Due  . MAMMOGRAM  Never done    There are no preventive care reminders to display for this  patient.   Lab Results  Component Value Date   TSH 1.99 09/15/2017   Lab Results  Component Value Date   WBC 6.5 01/29/2020   HGB 11.5 (L) 01/29/2020   HCT 36.5  01/29/2020   MCV 76.8 (L) 01/29/2020   PLT 311 01/29/2020   Lab Results  Component Value Date   NA 136 01/29/2020   K 4.6 01/29/2020   CO2 27 01/29/2020   GLUCOSE 97 01/29/2020   BUN 12 01/29/2020   CREATININE 0.83 01/29/2020   BILITOT 0.4 01/29/2020   ALKPHOS 78 05/02/2015   AST 18 01/29/2020   ALT 10 01/29/2020   PROT 7.3 01/29/2020   ALBUMIN 4.3 05/02/2015   CALCIUM 9.5 01/29/2020   ANIONGAP 7 05/05/2015   Lab Results  Component Value Date   CHOL 196 08/25/2020   Lab Results  Component Value Date   HDL 51 08/25/2020   Lab Results  Component Value Date   LDLCALC 105 (H) 08/25/2020   Lab Results  Component Value Date   TRIG 281 (H) 08/25/2020   Lab Results  Component Value Date   CHOLHDL 3.8 08/25/2020   No results found for: HGBA1C     Assessment & Plan:   Problem List Items Addressed This Visit    None    Visit Diagnoses    Seasonal allergies    -  Primary   Relevant Medications   methylPREDNISolone acetate (DEPO-MEDROL) injection 80 mg (Completed)   Sinus pain       Relevant Medications   traMADol (ULTRAM) 50 MG tablet   azithromycin (ZITHROMAX) 250 MG tablet       Meds ordered this encounter  Medications  . methylPREDNISolone acetate (DEPO-MEDROL) injection 80 mg  . traMADol (ULTRAM) 50 MG tablet    Sig: Take 1 tablet (50 mg total) by mouth at bedtime as needed for up to 7 days.    Dispense:  7 tablet    Refill:  0    Order Specific Question:   Supervising Provider    Answer:   Neva Seat, JEFFREY R [[id number]]  . azithromycin (ZITHROMAX) 250 MG tablet    Sig: Take 2 tabs on first day. Then take 1 tab daily. Finish entire supply.    Dispense:  6 tablet    Refill:  0    Order Specific Question:   Supervising Provider    Answer:   Neva Seat, JEFFREY R [[id number]]   PLAN  Concern for  allergies vs bacterial sinusitis. Given hx of sinus surgery will treat for both, especially given two weeks of symptoms.   Depo medrol 80mg  IM given in office  z pack  Tramadol for nighttime pain  Around the clock acetaminophen suggested  Continue claritin and flonase, add saline rinse  Patient encouraged to call clinic with any questions, comments, or concerns.   , NP

## 2020-12-15 ENCOUNTER — Ambulatory Visit (INDEPENDENT_AMBULATORY_CARE_PROVIDER_SITE_OTHER): Payer: Managed Care, Other (non HMO) | Admitting: Registered Nurse

## 2020-12-15 ENCOUNTER — Other Ambulatory Visit: Payer: Self-pay

## 2020-12-15 DIAGNOSIS — F419 Anxiety disorder, unspecified: Secondary | ICD-10-CM | POA: Diagnosis not present

## 2020-12-15 DIAGNOSIS — F988 Other specified behavioral and emotional disorders with onset usually occurring in childhood and adolescence: Secondary | ICD-10-CM | POA: Diagnosis not present

## 2020-12-15 DIAGNOSIS — F32A Depression, unspecified: Secondary | ICD-10-CM | POA: Diagnosis not present

## 2020-12-15 MED ORDER — ATOMOXETINE HCL 100 MG PO CAPS: 100.0000 mg | ORAL_CAPSULE | Freq: Every morning | ORAL | 2 refills | Status: DC

## 2020-12-15 MED ORDER — HYDROXYZINE HCL 25 MG PO TABS: 25.0000 mg | ORAL_TABLET | Freq: Three times a day (TID) | ORAL | 0 refills | Status: DC | PRN

## 2020-12-15 MED ORDER — ALPRAZOLAM ER 1 MG PO TB24: ORAL_TABLET | ORAL | 0 refills | Status: DC

## 2020-12-15 NOTE — Patient Instructions (Signed)
° ° ° °  If you have lab work done today you will be contacted with your lab results within the next 2 weeks.  If you have not heard from us then please contact us. The fastest way to get your results is to register for My Chart. ° ° °IF you received an x-ray today, you will receive an invoice from [facility]. Please contact [facility] at [phone] with questions or concerns regarding your invoice.  ° °IF you received labwork today, you will receive an invoice from [facility]. Please contact [facility] at [phone] with questions or concerns regarding your invoice.  ° °Our billing staff will not be able to assist you with questions regarding bills from these companies. ° °You will be contacted with the lab results as soon as they are available. The fastest way to get your results is to activate your My Chart account. Instructions are located on the last page of this paperwork. If you have not heard from us regarding the results in 2 weeks, please contact this office. °  ° ° ° °

## 2020-12-16 ENCOUNTER — Encounter: Payer: Self-pay | Admitting: Registered Nurse

## 2020-12-16 NOTE — Progress Notes (Signed)
Established Patient Office Visit  Subjective:  Patient ID: Erika Rogers, female    DOB: 08/20/1971  Age: 50 y.o. MRN: [id number]  CC:  Chief Complaint  Patient presents with  . Anxiety and Depression    Pt here for follow up and needs a refill Alprazolam states she doesn't feel well when she doesn't have it     HPI Erika Rogers presents for med refill  Alprazolam 1mg  PO qd XR. Good effect. No AEs.  Has been on for some time We have discussed risks associated with benzodiazepines in the past She is willing to try reducing use and using hydroxyzine in place  Past Medical History:  Diagnosis Date  . Allergy   . ASCUS of cervix with negative high risk HPV 09/2017  . Depression   . Migraine     Past Surgical History:  Procedure Laterality Date  . APPENDECTOMY    . ESOPHAGOGASTRODUODENOSCOPY N/A 05/03/2015   Procedure: ESOPHAGOGASTRODUODENOSCOPY (EGD);  Surgeon: 05/05/2015, MD;  Location: [facility] ENDOSCOPY;  Service: Endoscopy;  Laterality: N/A;  . NASAL SINUS SURGERY    . TONSILLECTOMY      Family History  Problem Relation Age of Onset  . Diabetes Brother   . Mental illness Brother     Social History   Socioeconomic History  . Marital status: Married    Spouse name: Not on file  . Number of children: 4  . Years of education: Not on file  . Highest education level: Not on file  Occupational History  . Not on file  Tobacco Use  . Smoking status: Never Smoker  . Smokeless tobacco: Never Used  Vaping Use  . Vaping Use: Never used  Substance and Sexual Activity  . Alcohol use: No    Alcohol/week: 0.0 standard drinks  . Drug use: No  . Sexual activity: Yes    Birth control/protection: None    Comment: 1st intercourse 73 yo-1 partner  Other Topics Concern  . Not on file  Social History Narrative   Single. Education: Grade School. Exercise: walks 2 times a week for 2 hours.   Social Determinants of Health   Financial Resource Strain: Not on file  Food Insecurity:  Not on file  Transportation Needs: Not on file  Physical Activity: Not on file  Stress: Not on file  Social Connections: Not on file  Intimate Partner Violence: Not on file    Outpatient Medications Prior to Visit  Medication Sig Dispense Refill  . cetirizine (ZYRTEC) 10 MG tablet Take 1 tablet (10 mg total) by mouth as needed for allergies. 90 tablet 0  . FLUoxetine (PROZAC) 40 MG capsule Take 1 capsule (40 mg total) by mouth daily. 90 capsule 3  . ALPRAZOLAM XR 1 MG 24 hr tablet TAKE ONE TABLET BY MOUTH DAILY AS NEEDED FOR ANXIETY 30 tablet 0  . atomoxetine (STRATTERA) 100 MG capsule Take 1 capsule (100 mg total) by mouth every morning. 30 capsule 2  . ALPRAZOLAM XR 2 MG 24 hr tablet Take 1 tablet (2 mg total) by mouth daily. (Patient not taking: Reported on 12/15/2020) 30 tablet 0  . azithromycin (ZITHROMAX) 250 MG tablet Take 2 tabs on first day. Then take 1 tab daily. Finish entire supply. (Patient not taking: Reported on 12/15/2020) 6 tablet 0  . busPIRone (BUSPAR) 30 MG tablet Take 1 tablet (30 mg total) by mouth 2 (two) times daily. (Patient not taking: Reported on 12/15/2020) 60 tablet 2  . lisdexamfetamine (VYVANSE) 40 MG  capsule Take 1 capsule (40 mg total) by mouth every morning. (Patient not taking: Reported on 12/15/2020) 30 capsule 0  . VYVANSE 40 MG capsule Take 1 capsule (40 mg total) by mouth every morning. (Patient not taking: Reported on 12/15/2020) 30 capsule 0   No facility-administered medications prior to visit.    Allergies  Allergen Reactions  . Amoxicillin Itching and Rash    ROS Review of Systems  Constitutional: Negative.   HENT: Negative.   Eyes: Negative.   Respiratory: Negative.   Cardiovascular: Negative.   Gastrointestinal: Negative.   Genitourinary: Negative.   Musculoskeletal: Negative.   Skin: Negative.   Neurological: Negative.   Psychiatric/Behavioral: Negative.   All other systems reviewed and are negative.     Objective:    Physical  Exam Vitals and nursing note reviewed.  Constitutional:      General: She is not in acute distress.    Appearance: Normal appearance. She is normal weight. She is not ill-appearing, toxic-appearing or diaphoretic.  Cardiovascular:     Rate and Rhythm: Normal rate and regular rhythm.     Heart sounds: Normal heart sounds. No murmur heard. No friction rub. No gallop.   Pulmonary:     Effort: Pulmonary effort is normal. No respiratory distress.     Breath sounds: Normal breath sounds. No stridor. No wheezing, rhonchi or rales.  Chest:     Chest wall: No tenderness.  Skin:    General: Skin is warm and dry.  Neurological:     General: No focal deficit present.     Mental Status: She is alert and oriented to person, place, and time. Mental status is at baseline.  Psychiatric:        Mood and Affect: Mood normal.        Behavior: Behavior normal.        Thought Content: Thought content normal.        Judgment: Judgment normal.     There were no vitals taken for this visit. Wt Readings from Last 3 Encounters:  11/10/20 118 lb (53.5 kg)  09/15/20 119 lb (54 kg)  05/12/20 123 lb 9.6 oz (56.1 kg)     Health Maintenance Due  Topic Date Due  . MAMMOGRAM  Never done    There are no preventive care reminders to display for this patient.  Lab Results  Component Value Date   TSH 1.99 09/15/2017   Lab Results  Component Value Date   WBC 6.5 01/29/2020   HGB 11.5 (L) 01/29/2020   HCT 36.5 01/29/2020   MCV 76.8 (L) 01/29/2020   PLT 311 01/29/2020   Lab Results  Component Value Date   NA 136 01/29/2020   K 4.6 01/29/2020   CO2 27 01/29/2020   GLUCOSE 97 01/29/2020   BUN 12 01/29/2020   CREATININE 0.83 01/29/2020   BILITOT 0.4 01/29/2020   ALKPHOS 78 05/02/2015   AST 18 01/29/2020   ALT 10 01/29/2020   PROT 7.3 01/29/2020   ALBUMIN 4.3 05/02/2015   CALCIUM 9.5 01/29/2020   ANIONGAP 7 05/05/2015   Lab Results  Component Value Date   CHOL 196 08/25/2020   Lab  Results  Component Value Date   HDL 51 08/25/2020   Lab Results  Component Value Date   LDLCALC 105 (H) 08/25/2020   Lab Results  Component Value Date   TRIG 281 (H) 08/25/2020   Lab Results  Component Value Date   CHOLHDL 3.8 08/25/2020   No results found  for: HGBA1C    Assessment & Plan:   Problem List Items Addressed This Visit   None   Visit Diagnoses    Anxiety and depression    -  Primary   Relevant Medications   hydrOXYzine (ATARAX/VISTARIL) 25 MG tablet   ALPRAZolam (ALPRAZOLAM XR) 1 MG 24 hr tablet   Attention deficit disorder, unspecified hyperactivity presence       Relevant Medications   atomoxetine (STRATTERA) 100 MG capsule      Meds ordered this encounter  Medications  . hydrOXYzine (ATARAX/VISTARIL) 25 MG tablet    Sig: Take 1 tablet (25 mg total) by mouth 3 (three) times daily as needed. Use in place of alprazolam.    Dispense:  30 tablet    Refill:  0    Order Specific Question:   Supervising Provider    Answer:   Neva Seat, JEFFREY R [[id number]]  . atomoxetine (STRATTERA) 100 MG capsule    Sig: Take 1 capsule (100 mg total) by mouth every morning.    Dispense:  30 capsule    Refill:  2    Order Specific Question:   Supervising Provider    Answer:   Neva Seat, JEFFREY R [[id number]]  . ALPRAZolam (ALPRAZOLAM XR) 1 MG 24 hr tablet    Sig: TAKE ONE TABLET BY MOUTH DAILY AS NEEDED FOR ANXIETY    Dispense:  30 tablet    Refill:  0    Order Specific Question:   Supervising Provider    Answer:   Neva Seat, JEFFREY R [[id number]]    Follow-up: No follow-ups on file.   PLAN  Refill meds  Start hydroxyzine in place of alprazolam on more mild anxiety days  Return in 3 mo for med check  Patient encouraged to call clinic with any questions, comments, or concerns.  Janeece Agee, NP

## 2021-01-01 ENCOUNTER — Ambulatory Visit: Payer: Medicare Other | Admitting: Registered Nurse

## 2021-01-01 ENCOUNTER — Ambulatory Visit (INDEPENDENT_AMBULATORY_CARE_PROVIDER_SITE_OTHER): Payer: Managed Care, Other (non HMO) | Admitting: Family Medicine

## 2021-01-01 ENCOUNTER — Encounter: Payer: Self-pay | Admitting: Family Medicine

## 2021-01-01 ENCOUNTER — Other Ambulatory Visit: Payer: Self-pay

## 2021-01-01 VITALS — BP 168/99 | HR 103 | Temp 98.0°F | Resp 18 | Ht 63.0 in | Wt 121.4 lb

## 2021-01-01 DIAGNOSIS — F4310 Post-traumatic stress disorder, unspecified: Secondary | ICD-10-CM

## 2021-01-01 DIAGNOSIS — G44229 Chronic tension-type headache, not intractable: Secondary | ICD-10-CM

## 2021-01-01 DIAGNOSIS — F32A Depression, unspecified: Secondary | ICD-10-CM | POA: Diagnosis not present

## 2021-01-01 DIAGNOSIS — F411 Generalized anxiety disorder: Secondary | ICD-10-CM

## 2021-01-01 NOTE — Patient Instructions (Addendum)
  I have made a referral to [facility] psychiatry for your evaluation and treatment.  I have ordered an MRI of your head.  I hope that they improve this.  I believe that with the combination of your headaches which seem to be getting worse and the gait issues with you almost falling that it is wise to get this scan if possible.  Sometimes insurance gives [place] difficulty in scheduling these.  Continue taking Tylenol for the headaches.  Work on establishing yourself a new family physician as discussed.  If you do not hear back from the scheduled MRI and psychiatry referral please call back to check on the referrals.  If you have lab work done today you will be contacted with your lab results within the next 2 weeks.  If you have not heard from [place] then please contact us. The fastest way to get your results is to register for My Chart.   IF you received an x-ray today, you will receive an invoice from [facility]. Please contact [facility] at [phone] with questions or concerns regarding your invoice.   IF you received labwork today, you will receive an invoice from [place] [facility]. Please contact [facility] at [phone] with questions or concerns regarding your invoice.   Our billing staff will not be able to assist you with questions regarding bills from these companies.  You will be contacted with the lab results as soon as they are available. The fastest way to get your results is to activate your My Chart account. Instructions are located on the last page of this paperwork. If you have not heard from [place] regarding the results in 2 weeks, please contact this office.
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
## 2021-01-19 ENCOUNTER — Other Ambulatory Visit: Payer: Self-pay

## 2021-01-19 ENCOUNTER — Ambulatory Visit
Admission: RE | Admit: 2021-01-19 | Discharge: 2021-01-19 | Disposition: A | Discharge status: Home / Self Care | Payer: Managed Care, Other (non HMO) | Source: Ambulatory Visit | Attending: Family Medicine | Admitting: Family Medicine

## 2021-01-19 DIAGNOSIS — G44229 Chronic tension-type headache, not intractable: Secondary | ICD-10-CM

## 2021-01-19 MED ORDER — GADOBENATE DIMEGLUMINE 529 MG/ML IV SOLN
11.0000 mL | Freq: Once | INTRAVENOUS | Status: AC | PRN
  Administered 2021-01-19: 11 mL via INTRAVENOUS

## 2021-01-23 NOTE — Progress Notes (Signed)
Richard, please call this patient: (Clinical team please show this to Erika Rogers also to be sure it is seen).  The MRI showed a tiny spot on the brain which is probably and artifact (which is a false spot on the scan) but she probably needs referral to a neurosurgeon to review this.  Janace Hoard

## 2021-01-31 ENCOUNTER — Other Ambulatory Visit: Payer: Self-pay | Admitting: Registered Nurse

## 2021-01-31 DIAGNOSIS — R930 Abnormal findings on diagnostic imaging of skull and head, not elsewhere classified: Secondary | ICD-10-CM

## 2021-02-04 ENCOUNTER — Telehealth: Payer: Self-pay

## 2021-02-04 NOTE — Telephone Encounter (Signed)
Called pt daughter at [phone] and relayed to her the results of the MRI and informed her that we have referred her mom out. Daughter stated understanding and I have given her the number to neuro-surgey that we referred out to.

## 2021-03-16 ENCOUNTER — Other Ambulatory Visit: Payer: Self-pay

## 2021-03-16 ENCOUNTER — Ambulatory Visit (INDEPENDENT_AMBULATORY_CARE_PROVIDER_SITE_OTHER): Payer: Managed Care, Other (non HMO) | Admitting: Registered Nurse

## 2021-03-16 ENCOUNTER — Encounter: Payer: Self-pay | Admitting: Registered Nurse

## 2021-03-16 VITALS — BP 136/70 | HR 61 | Temp 98.2°F | Resp 16 | Ht 63.0 in | Wt 127.0 lb

## 2021-03-16 DIAGNOSIS — G44229 Chronic tension-type headache, not intractable: Secondary | ICD-10-CM

## 2021-03-16 DIAGNOSIS — Z1329 Encounter for screening for other suspected endocrine disorder: Secondary | ICD-10-CM | POA: Diagnosis not present

## 2021-03-16 DIAGNOSIS — F32A Depression, unspecified: Secondary | ICD-10-CM | POA: Diagnosis not present

## 2021-03-16 DIAGNOSIS — F988 Other specified behavioral and emotional disorders with onset usually occurring in childhood and adolescence: Secondary | ICD-10-CM | POA: Diagnosis not present

## 2021-03-16 DIAGNOSIS — F419 Anxiety disorder, unspecified: Secondary | ICD-10-CM

## 2021-03-16 DIAGNOSIS — Z130 Encounter for screening for diseases of the blood and blood-forming organs and certain disorders involving the immune mechanism: Secondary | ICD-10-CM

## 2021-03-16 DIAGNOSIS — Z13220 Encounter for screening for lipoid disorders: Secondary | ICD-10-CM

## 2021-03-16 DIAGNOSIS — Z13228 Encounter for screening for other metabolic disorders: Secondary | ICD-10-CM

## 2021-03-16 DIAGNOSIS — J302 Other seasonal allergic rhinitis: Secondary | ICD-10-CM | POA: Diagnosis not present

## 2021-03-16 LAB — COMPREHENSIVE METABOLIC PANEL
ALT: 14 U/L (ref 0–35)
AST: 22 U/L (ref 0–37)
Albumin: 4 g/dL (ref 3.5–5.2)
Alkaline Phosphatase: 92 U/L (ref 39–117)
BUN: 11 mg/dL (ref 6–23)
CO2: 25 mEq/L (ref 19–32)
Calcium: 9.1 mg/dL (ref 8.4–10.5)
Chloride: 103 mEq/L (ref 96–112)
Creatinine, Ser: 0.65 mg/dL (ref 0.40–1.20)
GFR: 102.05 mL/min (ref >60.00)
Glucose, Bld: 94 mg/dL (ref 70–99)
Potassium: 3.8 mEq/L (ref 3.5–5.1)
Sodium: 138 mEq/L (ref 135–145)
Total Bilirubin: 0.5 mg/dL (ref 0.2–1.2)
Total Protein: 8 g/dL (ref 6.0–8.3)

## 2021-03-16 LAB — LIPID PANEL
Cholesterol: 239 mg/dL — ABNORMAL HIGH (ref 0–200)
HDL: 61.9 mg/dL (ref >39.00)
NonHDL: 177.28
Total CHOL/HDL Ratio: 4
Triglycerides: 217 mg/dL — ABNORMAL HIGH (ref 0.0–149.0)
VLDL: 43.4 mg/dL — ABNORMAL HIGH (ref 0.0–40.0)

## 2021-03-16 LAB — CBC WITH DIFFERENTIAL/PLATELET
Basophils Absolute: 0 10*3/uL (ref 0.0–0.1)
Basophils Relative: 0.4 % (ref 0.0–3.0)
Eosinophils Absolute: 0.5 10*3/uL (ref 0.0–0.7)
Eosinophils Relative: 9.6 % — ABNORMAL HIGH (ref 0.0–5.0)
HCT: 37.5 % (ref 36.0–46.0)
Hemoglobin: 12.1 g/dL (ref 12.0–15.0)
Lymphocytes Relative: 40.8 % (ref 12.0–46.0)
Lymphs Abs: 2.1 10*3/uL (ref 0.7–4.0)
MCHC: 32.2 g/dL (ref 30.0–36.0)
MCV: 74.9 fl — ABNORMAL LOW (ref 78.0–100.0)
Monocytes Absolute: 0.5 10*3/uL (ref 0.1–1.0)
Monocytes Relative: 9.6 % (ref 3.0–12.0)
Neutro Abs: 2.1 10*3/uL (ref 1.4–7.7)
Neutrophils Relative %: 39.6 % — ABNORMAL LOW (ref 43.0–77.0)
Platelets: 321 10*3/uL (ref 150.0–400.0)
RBC: 5 Mil/uL (ref 3.87–5.11)
RDW: 16 % — ABNORMAL HIGH (ref 11.5–15.5)
WBC: 5.2 10*3/uL (ref 4.0–10.5)

## 2021-03-16 LAB — B12 AND FOLATE PANEL
Folate: >23.6 ng/mL (ref >5.9)
Vitamin B-12: 261 pg/mL (ref 211–911)

## 2021-03-16 LAB — IBC + FERRITIN
Ferritin: 8.3 ng/mL — ABNORMAL LOW (ref 10.0–291.0)
Iron: 87 ug/dL (ref 42–145)
Saturation Ratios: 23.4 % (ref 20.0–50.0)
Transferrin: 266 mg/dL (ref 212.0–360.0)

## 2021-03-16 LAB — LDL CHOLESTEROL, DIRECT: Direct LDL: 163 mg/dL

## 2021-03-16 LAB — TSH: TSH: 1.27 u[IU]/mL (ref 0.35–4.50)

## 2021-03-16 LAB — HEMOGLOBIN A1C: Hgb A1c MFr Bld: 5.7 % (ref 4.6–6.5)

## 2021-03-16 LAB — VITAMIN D 25 HYDROXY (VIT D DEFICIENCY, FRACTURES): VITD: 18.8 ng/mL — ABNORMAL LOW (ref 30.00–100.00)

## 2021-03-16 MED ORDER — ALPRAZOLAM ER 1 MG PO TB24: ORAL_TABLET | ORAL | 0 refills | Status: DC

## 2021-03-16 MED ORDER — FLUOXETINE HCL 40 MG PO CAPS: 40.0000 mg | ORAL_CAPSULE | Freq: Every day | ORAL | 1 refills | Status: DC

## 2021-03-16 MED ORDER — CETIRIZINE HCL 10 MG PO TABS: 10.0000 mg | ORAL_TABLET | ORAL | 1 refills | Status: DC | PRN

## 2021-03-16 MED ORDER — HYDROXYZINE HCL 25 MG PO TABS: 25.0000 mg | ORAL_TABLET | Freq: Three times a day (TID) | ORAL | 1 refills | Status: DC | PRN

## 2021-03-16 MED ORDER — ATOMOXETINE HCL 100 MG PO CAPS: 100.0000 mg | ORAL_CAPSULE | Freq: Every morning | ORAL | 1 refills | Status: DC

## 2021-03-16 NOTE — Progress Notes (Signed)
Established Patient Office Visit  Subjective:  Patient ID: Erika Rogers, female    DOB: [birth year]  Age: 50 y.o. MRN: [id number]  CC:  Chief Complaint  Patient presents with  . Depression    Pt reports doing okay better than she was in January   . Follow-up    Pt also would like to discuss vitamin NeuroQ which she states has made her feel very good     HPI Erika Rogers presents for 3 mo follow up on chronic conditions  Seen in January for follow up on mental health. Has been using alprazolam less - last disp was 30 tabs on Jan 11 per PDMP. Using hydroxyzine prn with good effect  Has been taking NeuroQ and feeling better. 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 No further acute abnormality that would contribute to symptoms.   She was referred to [facility] Psychiatry by Dr. Janace Hoard at visit on 01/01/21. She has not made an appointment yet. Will replace referral and ask that they reach out to her.  History of anemia, mild. Will recheck today History of hyperlipidemia. Has been monitoring diet and trying to stay active.  Past Medical History:  Diagnosis Date  . Allergy   . ASCUS of cervix with negative high risk HPV 09/2017  . Depression   . Migraine     Past Surgical History:  Procedure Laterality Date  . APPENDECTOMY    . ESOPHAGOGASTRODUODENOSCOPY N/A 05/03/2015   Procedure: ESOPHAGOGASTRODUODENOSCOPY (EGD);  Surgeon: Beverley Fiedler, MD;  Location: [facility] ENDOSCOPY;  Service: Endoscopy;  Laterality: N/A;  . NASAL SINUS SURGERY    . TONSILLECTOMY      Family History  Problem Relation Age of Onset  . Diabetes Brother   . Mental illness Brother     Social History   Socioeconomic History  . Marital status: Married    Spouse name: Not on file  . Number of children: 4  . Years of  education: Not on file  . Highest education level: Not on file  Occupational History  . Not on file  Tobacco Use  . Smoking status: Never Smoker  . Smokeless tobacco: Never Used  Vaping Use  . Vaping Use: Never used  Substance and Sexual Activity  . Alcohol use: No    Alcohol/week: 0.0 standard drinks  . Drug use: No  . Sexual activity: Yes    Birth control/protection: None    Comment: 1st intercourse 64 Rogers-1 partner  Other Topics Concern  . Not on file  Social History Narrative   Single. Education: Grade School. Exercise: walks 2 times a week for 2 hours.   Social Determinants of Health   Financial Resource Strain: Not on file  Food Insecurity: Not on file  Transportation Needs: Not on file  Physical Activity: Not on file  Stress: Not on file  Social Connections: Not on file  Intimate Partner Violence: Not on file    Outpatient Medications Prior to Visit  Medication Sig Dispense Refill  . ALPRAZolam (ALPRAZOLAM XR) 1 MG 24 hr tablet TAKE ONE TABLET BY MOUTH DAILY AS NEEDED FOR ANXIETY 30 tablet 0  . atomoxetine (STRATTERA) 100 MG capsule Take 1 capsule (100 mg total) by mouth every morning. 30 capsule 2  . cetirizine (ZYRTEC) 10 MG tablet Take 1 tablet (10 mg total) by  mouth as needed for allergies. 90 tablet 0  . FLUoxetine (PROZAC) 40 MG capsule Take 1 capsule (40 mg total) by mouth daily. 90 capsule 3  . hydrOXYzine (ATARAX/VISTARIL) 25 MG tablet Take 1 tablet (25 mg total) by mouth 3 (three) times daily as needed. Use in place of alprazolam. 30 tablet 0   No facility-administered medications prior to visit.    Allergies  Allergen Reactions  . Amoxicillin Itching and Rash    ROS Review of Systems  Constitutional: Negative.   HENT: Negative.   Eyes: Negative.   Respiratory: Negative.   Cardiovascular: Negative.   Gastrointestinal: Negative.   Genitourinary: Negative.   Musculoskeletal: Negative.   Skin: Negative.   Neurological: Negative.    Psychiatric/Behavioral: Negative.   All other systems reviewed and are negative.     Objective:    Physical Exam Vitals and nursing note reviewed.  Constitutional:      General: She is not in acute distress.    Appearance: Normal appearance. She is normal weight. She is not ill-appearing, toxic-appearing or diaphoretic.  Cardiovascular:     Rate and Rhythm: Normal rate and regular rhythm.     Heart sounds: Normal heart sounds. No murmur heard. No friction rub. No gallop.   Pulmonary:     Effort: Pulmonary effort is normal. No respiratory distress.     Breath sounds: Normal breath sounds. No stridor. No wheezing, rhonchi or rales.  Chest:     Chest wall: No tenderness.  Skin:    General: Skin is warm and dry.  Neurological:     General: No focal deficit present.     Mental Status: She is alert and oriented to person, place, and time. Mental status is at baseline.  Psychiatric:        Mood and Affect: Mood normal.        Behavior: Behavior normal.        Thought Content: Thought content normal.        Judgment: Judgment normal.     BP 136/70   Pulse 61   Temp 98.2 F (36.8 C) (Temporal)   Resp 16   Ht 5\' 3"  (1.6 m)   Wt 127 lb (57.6 kg)   SpO2 99%   BMI 22.50 kg/m  Wt Readings from Last 3 Encounters:  03/16/21 127 lb (57.6 kg)  01/01/21 121 lb 6.4 oz (55.1 kg)  11/10/20 118 lb (53.5 kg)     Health Maintenance Due  Topic Date Due  . MAMMOGRAM  Never done    There are no preventive care reminders to display for this patient.  Lab Results  Component Value Date   TSH 1.99 09/15/2017   Lab Results  Component Value Date   WBC 6.5 01/29/2020   HGB 11.5 (L) 01/29/2020   HCT 36.5 01/29/2020   MCV 76.8 (L) 01/29/2020   PLT 311 01/29/2020   Lab Results  Component Value Date   NA 136 01/29/2020   K 4.6 01/29/2020   CO2 27 01/29/2020   GLUCOSE 97 01/29/2020   BUN 12 01/29/2020   CREATININE 0.83 01/29/2020   BILITOT 0.4 01/29/2020   ALKPHOS 78  05/02/2015   AST 18 01/29/2020   ALT 10 01/29/2020   PROT 7.3 01/29/2020   ALBUMIN 4.3 05/02/2015   CALCIUM 9.5 01/29/2020   ANIONGAP 7 05/05/2015   Lab Results  Component Value Date   CHOL 196 08/25/2020   Lab Results  Component Value Date   HDL 51 08/25/2020  Lab Results  Component Value Date   LDLCALC 105 (H) 08/25/2020   Lab Results  Component Value Date   TRIG 281 (H) 08/25/2020   Lab Results  Component Value Date   CHOLHDL 3.8 08/25/2020   No results found for: HGBA1C    Assessment & Plan:   Problem List Items Addressed This Visit   None   Visit Diagnoses    Screening for endocrine, metabolic and immunity disorder    -  Primary   Relevant Orders   Comprehensive metabolic panel   Hemoglobin A1c   TSH   CBC with Differential/Platelet   Chronic tension-type headache, not intractable       Relevant Orders   IBC + Ferritin   Vitamin D (25 hydroxy)   B12 and Folate Panel   Anxiety and depression       Relevant Orders   Vitamin D (25 hydroxy)   Screening for lipid disorders       Relevant Orders   Lipid panel      No orders of the defined types were placed in this encounter.   Follow-up: No follow-ups on file.   PLAN  Encourage pt to reach out to [facility]  Continue supplement if helping  Reviewed last labs and MRI with patient  Labs collected. Will follow up with the patient as warranted.  Continue current meds  Patient encouraged to call clinic with any questions, comments, or concerns.  Janeece Agee, NP

## 2021-03-17 ENCOUNTER — Other Ambulatory Visit: Payer: Self-pay | Admitting: Registered Nurse

## 2021-03-17 DIAGNOSIS — E559 Vitamin D deficiency, unspecified: Secondary | ICD-10-CM

## 2021-03-17 DIAGNOSIS — E782 Mixed hyperlipidemia: Secondary | ICD-10-CM

## 2021-03-17 DIAGNOSIS — D509 Iron deficiency anemia, unspecified: Secondary | ICD-10-CM

## 2021-03-17 MED ORDER — ROSUVASTATIN CALCIUM 5 MG PO TABS: 5.0000 mg | ORAL_TABLET | Freq: Every day | ORAL | 3 refills | Status: DC

## 2021-03-17 MED ORDER — IRON (FERROUS SULFATE) 325 (65 FE) MG PO TABS
325.0000 mg | ORAL_TABLET | Freq: Every day | ORAL | 3 refills | Status: DC
Start: 2021-03-17 — End: 2021-12-20

## 2021-03-17 MED ORDER — VITAMIN D (ERGOCALCIFEROL) 1.25 MG (50000 UNIT) PO CAPS: 50000.0000 [IU] | ORAL_CAPSULE | ORAL | 0 refills | Status: DC

## 2021-03-31 ENCOUNTER — Telehealth: Payer: Self-pay

## 2021-03-31 NOTE — Telephone Encounter (Signed)
Patient states she has been taking Strattera for 6 days now.  States she throws up 15 to 20 minutes after she takes the medication each morning.  States she did not take the medication today and she did not throw up.    Please advise.

## 2021-04-05 ENCOUNTER — Other Ambulatory Visit: Payer: Self-pay

## 2021-04-05 ENCOUNTER — Encounter: Payer: Self-pay | Admitting: Registered Nurse

## 2021-04-05 ENCOUNTER — Telehealth (INDEPENDENT_AMBULATORY_CARE_PROVIDER_SITE_OTHER): Payer: Managed Care, Other (non HMO) | Admitting: Registered Nurse

## 2021-04-05 DIAGNOSIS — R058 Other specified cough: Secondary | ICD-10-CM | POA: Diagnosis not present

## 2021-04-05 MED ORDER — HYDROCODONE BIT-HOMATROP MBR 5-1.5 MG/5ML PO SOLN: 5.0000 mL | Freq: Every evening | ORAL | 0 refills | Status: DC | PRN

## 2021-04-05 MED ORDER — ALBUTEROL SULFATE HFA 108 (90 BASE) MCG/ACT IN AERS: 2.0000 | INHALATION_SPRAY | Freq: Four times a day (QID) | RESPIRATORY_TRACT | 0 refills | Status: DC | PRN

## 2021-04-05 NOTE — Progress Notes (Signed)
Telemedicine Encounter- SOAP NOTE Established Patient  This telephone encounter was conducted with the patient's (or proxy's) verbal consent via audio telecommunications: yes  Patient was instructed to have this encounter in a suitably private space; and to only have persons present to whom they give permission to participate. In addition, patient identity was confirmed by use of name plus two identifiers (DOB and address).  I discussed the limitations, risks, security and privacy concerns of performing an evaluation and management service by telephone and the availability of in person appointments. I also discussed with the patient that there may be a patient responsible charge related to this service. The patient expressed understanding and agreed to proceed.  I spent a total of 15 minutes talking with the patient or their proxy.  Patient at home Provider in office  Participants: Jari Sportsman, NP and [facility]  Chief Complaint  Patient presents with  . Cough    Patient states she has been having a cough for a while and now its only when she goes outside with the pollen and at night. Patient states she has not been able to sleep due to coughing. She has not took any medication yet.    Subjective   Erika Rogers is a 50 y.o. established patient. Telephone visit today for cough  HPI Ongoing for a few weeks Nonproductive cough with wheezing Worse when going outside, worse at night Continues to take hydroxyzine 25mg  PO qd and cetirizine 10mg  PO qd. No apparent effect.  Denies chest pain, palpitations, hemoptysis, lightheadedness, dizziness ? Of snoring. Notes she wakes frequently when sleeping. Has had headaches on waking in the past. Has not had sleep study. Had been on inhaler in the past with good effect. Hopes to try again  Patient Active Problem List   Diagnosis Date Noted  . Microcytosis 05/15/2015  . BRBPR (bright red blood per rectum) 05/03/2015  . GERD 02/20/2009  .  DYSPHAGIA 02/20/2009    Past Medical History:  Diagnosis Date  . Allergy   . ASCUS of cervix with negative high risk HPV 09/2017  . Depression   . Migraine     Current Outpatient Medications  Medication Sig Dispense Refill  . albuterol (VENTOLIN HFA) 108 (90 Base) MCG/ACT inhaler Inhale 2 puffs into the lungs every 6 (six) hours as needed for wheezing or shortness of breath. 8 g 0  . ALPRAZolam (ALPRAZOLAM XR) 1 MG 24 hr tablet TAKE ONE TABLET BY MOUTH DAILY AS NEEDED FOR ANXIETY 30 tablet 0  . cetirizine (ZYRTEC) 10 MG tablet Take 1 tablet (10 mg total) by mouth as needed for allergies. 90 tablet 1  . FLUoxetine (PROZAC) 40 MG capsule Take 1 capsule (40 mg total) by mouth daily. 90 capsule 1  . HYDROcodone bit-homatropine (HYCODAN) 5-1.5 MG/5ML syrup Take 5 mLs by mouth at bedtime as needed for cough. 120 mL 0  . hydrOXYzine (ATARAX/VISTARIL) 25 MG tablet Take 1 tablet (25 mg total) by mouth 3 (three) times daily as needed. Use in place of alprazolam. 90 tablet 1  . Iron, Ferrous Sulfate, 325 (65 Fe) MG TABS Take 325 mg by mouth daily. 90 tablet 3  . rosuvastatin (CRESTOR) 5 MG tablet Take 1 tablet (5 mg total) by mouth daily. 90 tablet 3  . Vitamin D, Ergocalciferol, (DRISDOL) 1.25 MG (50000 UNIT) CAPS capsule Take 1 capsule (50,000 Units total) by mouth every 7 (seven) days. 12 capsule 0   No current facility-administered medications for this visit.  Allergies  Allergen Reactions  . Amoxicillin Itching and Rash    Social History   Socioeconomic History  . Marital status: Married    Spouse name: Not on file  . Number of children: 4  . Years of education: Not on file  . Highest education level: Not on file  Occupational History  . Not on file  Tobacco Use  . Smoking status: Never Smoker  . Smokeless tobacco: Never Used  Vaping Use  . Vaping Use: Never used  Substance and Sexual Activity  . Alcohol use: No    Alcohol/week: 0.0 standard drinks  . Drug use: No  .  Sexual activity: Yes    Birth control/protection: None    Comment: 1st intercourse 72 yo-1 partner  Other Topics Concern  . Not on file  Social History Narrative   Single. Education: Grade School. Exercise: walks 2 times a week for 2 hours.   Social Determinants of Health   Financial Resource Strain: Not on file  Food Insecurity: Not on file  Transportation Needs: Not on file  Physical Activity: Not on file  Stress: Not on file  Social Connections: Not on file  Intimate Partner Violence: Not on file    ROS Per hpi   Objective   Vitals as reported by the patient: There were no vitals filed for this visit.  Erika Rogers was seen today for cough.  Diagnoses and all orders for this visit:  Allergic cough -     albuterol (VENTOLIN HFA) 108 (90 Base) MCG/ACT inhaler; Inhale 2 puffs into the lungs every 6 (six) hours as needed for wheezing or shortness of breath. -     HYDROcodone bit-homatropine (HYCODAN) 5-1.5 MG/5ML syrup; Take 5 mLs by mouth at bedtime as needed for cough.   PLAN  Likely allergic cough. Could be some OSA affecting sleep but will try albuterol inhaler first  Encouraged patient to increase hydroxyzine to bid if desired. Continued to caution against excessive sedative use.  Patient encouraged to call clinic with any questions, comments, or concerns.   I discussed the assessment and treatment plan with the patient. The patient was provided an opportunity to ask questions and all were answered. The patient agreed with the plan and demonstrated an understanding of the instructions.   The patient was advised to call back or seek an in-person evaluation if the symptoms worsen or if the condition fails to improve as anticipated.  I provided 15 minutes of non-face-to-face time during this encounter.  Janeece Agee, NP  Primary Care at [facility]

## 2021-05-20 NOTE — Telephone Encounter (Signed)
Was this addressed?

## 2021-05-21 NOTE — Telephone Encounter (Signed)
Please advise 

## 2021-05-21 NOTE — Telephone Encounter (Signed)
Ok to stop taking medication. She may benefit from an anxiety standpoint by stopping this.   Thank you  Rich

## 2021-08-15 ENCOUNTER — Other Ambulatory Visit: Payer: Self-pay | Admitting: Registered Nurse

## 2021-08-15 DIAGNOSIS — F419 Anxiety disorder, unspecified: Secondary | ICD-10-CM

## 2021-08-15 DIAGNOSIS — F32A Depression, unspecified: Secondary | ICD-10-CM

## 2021-09-14 ENCOUNTER — Encounter: Payer: Self-pay | Admitting: Registered Nurse

## 2021-09-14 ENCOUNTER — Other Ambulatory Visit: Payer: Self-pay

## 2021-09-14 ENCOUNTER — Ambulatory Visit (INDEPENDENT_AMBULATORY_CARE_PROVIDER_SITE_OTHER): Payer: Managed Care, Other (non HMO) | Admitting: Registered Nurse

## 2021-09-14 VITALS — BP 110/72 | HR 90 | Temp 98.2°F | Resp 18 | Ht 60.0 in | Wt 114.4 lb

## 2021-09-14 DIAGNOSIS — Z23 Encounter for immunization: Secondary | ICD-10-CM | POA: Diagnosis not present

## 2021-09-14 DIAGNOSIS — R058 Other specified cough: Secondary | ICD-10-CM

## 2021-09-14 DIAGNOSIS — E559 Vitamin D deficiency, unspecified: Secondary | ICD-10-CM | POA: Diagnosis not present

## 2021-09-14 DIAGNOSIS — Z862 Personal history of diseases of the blood and blood-forming organs and certain disorders involving the immune mechanism: Secondary | ICD-10-CM

## 2021-09-14 DIAGNOSIS — F419 Anxiety disorder, unspecified: Secondary | ICD-10-CM

## 2021-09-14 DIAGNOSIS — E782 Mixed hyperlipidemia: Secondary | ICD-10-CM | POA: Diagnosis not present

## 2021-09-14 DIAGNOSIS — J302 Other seasonal allergic rhinitis: Secondary | ICD-10-CM

## 2021-09-14 DIAGNOSIS — F32A Depression, unspecified: Secondary | ICD-10-CM

## 2021-09-14 DIAGNOSIS — R7303 Prediabetes: Secondary | ICD-10-CM

## 2021-09-14 DIAGNOSIS — H04129 Dry eye syndrome of unspecified lacrimal gland: Secondary | ICD-10-CM

## 2021-09-14 LAB — CBC WITH DIFFERENTIAL/PLATELET
Basophils Absolute: 0 10*3/uL (ref 0.0–0.1)
Basophils Relative: 0.5 % (ref 0.0–3.0)
Eosinophils Absolute: 0.5 10*3/uL (ref 0.0–0.7)
Eosinophils Relative: 8.3 % — ABNORMAL HIGH (ref 0.0–5.0)
HCT: 37.9 % (ref 36.0–46.0)
Hemoglobin: 12.2 g/dL (ref 12.0–15.0)
Lymphocytes Relative: 32.6 % (ref 12.0–46.0)
Lymphs Abs: 2.1 10*3/uL (ref 0.7–4.0)
MCHC: 32.3 g/dL (ref 30.0–36.0)
MCV: 76.7 fl — ABNORMAL LOW (ref 78.0–100.0)
Monocytes Absolute: 0.5 10*3/uL (ref 0.1–1.0)
Monocytes Relative: 8.2 % (ref 3.0–12.0)
Neutro Abs: 3.3 10*3/uL (ref 1.4–7.7)
Neutrophils Relative %: 50.4 % (ref 43.0–77.0)
Platelets: 290 10*3/uL (ref 150.0–400.0)
RBC: 4.94 Mil/uL (ref 3.87–5.11)
RDW: 15.2 % (ref 11.5–15.5)
WBC: 6.5 10*3/uL (ref 4.0–10.5)

## 2021-09-14 LAB — COMPREHENSIVE METABOLIC PANEL
ALT: 20 U/L (ref 0–35)
AST: 24 U/L (ref 0–37)
Albumin: 4.4 g/dL (ref 3.5–5.2)
Alkaline Phosphatase: 96 U/L (ref 39–117)
BUN: 13 mg/dL (ref 6–23)
CO2: 30 mEq/L (ref 19–32)
Calcium: 9.6 mg/dL (ref 8.4–10.5)
Chloride: 101 mEq/L (ref 96–112)
Creatinine, Ser: 0.7 mg/dL (ref 0.40–1.20)
GFR: 99.89 mL/min (ref >60.00)
Glucose, Bld: 70 mg/dL (ref 70–99)
Potassium: 4.4 mEq/L (ref 3.5–5.1)
Sodium: 139 mEq/L (ref 135–145)
Total Bilirubin: 0.6 mg/dL (ref 0.2–1.2)
Total Protein: 7.4 g/dL (ref 6.0–8.3)

## 2021-09-14 LAB — VITAMIN D 25 HYDROXY (VIT D DEFICIENCY, FRACTURES): VITD: 24.91 ng/mL — ABNORMAL LOW (ref 30.00–100.00)

## 2021-09-14 LAB — LIPID PANEL
Cholesterol: 168 mg/dL (ref 0–200)
HDL: 71.4 mg/dL (ref >39.00)
LDL Cholesterol: 58 mg/dL (ref 0–99)
NonHDL: 96.49
Total CHOL/HDL Ratio: 2
Triglycerides: 192 mg/dL — ABNORMAL HIGH (ref 0.0–149.0)
VLDL: 38.4 mg/dL (ref 0.0–40.0)

## 2021-09-14 LAB — HEMOGLOBIN A1C: Hgb A1c MFr Bld: 6.3 % (ref 4.6–6.5)

## 2021-09-14 MED ORDER — ALPRAZOLAM ER 1 MG PO TB24: 1.0000 mg | ORAL_TABLET | Freq: Every evening | ORAL | 0 refills | Status: DC | PRN

## 2021-09-14 MED ORDER — FLUOXETINE HCL 40 MG PO CAPS: 40.0000 mg | ORAL_CAPSULE | Freq: Every day | ORAL | 1 refills | Status: DC

## 2021-09-14 MED ORDER — HYDROXYZINE HCL 25 MG PO TABS: 25.0000 mg | ORAL_TABLET | Freq: Three times a day (TID) | ORAL | 1 refills | Status: DC | PRN

## 2021-09-14 MED ORDER — ALBUTEROL SULFATE HFA 108 (90 BASE) MCG/ACT IN AERS: 2.0000 | INHALATION_SPRAY | Freq: Four times a day (QID) | RESPIRATORY_TRACT | 5 refills | Status: DC | PRN

## 2021-09-14 MED ORDER — CETIRIZINE HCL 10 MG PO TABS: 10.0000 mg | ORAL_TABLET | ORAL | 1 refills | Status: DC | PRN

## 2021-09-14 MED ORDER — ROSUVASTATIN CALCIUM 5 MG PO TABS: 5.0000 mg | ORAL_TABLET | Freq: Every day | ORAL | 1 refills | Status: DC

## 2021-09-14 NOTE — Patient Instructions (Addendum)
Ms. Jamie Hafford to see you!   AWESOME JOB with the weight loss! I think my biggest excitement is that you are establishing healthy habits.  We will see how labs look. I'll call if I have any worries.  Can use hydroxyzine up to three times daily if you need to. Ok to split the tabs in half.  See you in 6 months! Call sooner if you need anything   Thank you  Rich     If you have lab work done today you will be contacted with your lab results within the next 2 weeks.  If you have not heard from [place] then please contact us. The fastest way to get your results is to register for My Chart.   IF you received an x-ray today, you will receive an invoice from [facility]. Please contact [facility] at [phone] with questions or concerns regarding your invoice.   IF you received labwork today, you will receive an invoice from [facility]. Please contact [facility] at [phone] with questions or concerns regarding your invoice.   Our billing staff will not be able to assist you with questions regarding bills from these companies.  You will be contacted with the lab results as soon as they are available. The fastest way to get your results is to activate your My Chart account. Instructions are located on the last page of this paperwork. If you have not heard from [place] regarding the results in 2 weeks, please contact this office.

## 2021-09-14 NOTE — Progress Notes (Signed)
Established Patient Office Visit  Subjective:  Patient ID: Erika Rogers, female    DOB: [birth year]  Age: 50 y.o. MRN: [id number]  CC:  Chief Complaint  Patient presents with   Follow-up    Patient states she is here for a 6 month follow up. Patient states her migraines are better and she is also sleeping well.    HPI Erika Rogers presents for 6 mo follow up   Sleep disturbance and migraine  Sleep disturbance: Taking hydroxyzine 25mg  po tid prn. Good effect. Alprazolam 1mg  24 hr release daily. Good effect Acknowledges risks of benzodiazepines.   Migraine: Using OTCs.  Better control of mental health has helped.  Hx of anemia, vit D deficiency which have been corrected.   Hld: Rosuvastatin 5mg  po qd Good effect, no AE Lab Results  Component Value Date   CHOL 239 (H) 03/16/2021   HDL 61.90 03/16/2021   LDLCALC 105 (H) 08/25/2020   LDLDIRECT 163.0 03/16/2021   TRIG 217.0 (H) 03/16/2021   CHOLHDL 4 03/16/2021   Dry eye Ongoing  Has used OTC  No relief Worse with anxiety  Anxiety Using strattera, fluoxetine, hydroxyzine and alprazolam Good effect, no AE Notes hydroxyzine is once daily rather than tid.   Flu vaccine Interested today No AE in past.  Otherwise no acute concerns.   Past Medical History:  Diagnosis Date   Allergy    ASCUS of cervix with negative high risk HPV 09/2017   Depression    Migraine     Past Surgical History:  Procedure Laterality Date   APPENDECTOMY     ESOPHAGOGASTRODUODENOSCOPY N/A 05/03/2015   Procedure: ESOPHAGOGASTRODUODENOSCOPY (EGD);  Surgeon: 05/16/2021, MD;  Location: [facility];  Service: Endoscopy;  Laterality: N/A;   NASAL SINUS SURGERY     TONSILLECTOMY      Family History  Problem Relation Age of Onset   Diabetes Brother    Mental illness Brother     Social History   Socioeconomic History   Marital status: Married    Spouse name: Not on file   Number of children: 4   Years of education: Not on file    Highest education level: Not on file  Occupational History   Not on file  Tobacco Use   Smoking status: Never   Smokeless tobacco: Never  Vaping Use   Vaping Use: Never used  Substance and Sexual Activity   Alcohol use: No    Alcohol/week: 0.0 standard drinks   Drug use: No   Sexual activity: Yes    Birth control/protection: None    Comment: 1st intercourse 54 yo-1 partner  Other Topics Concern   Not on file  Social History Narrative   Single. Education: Grade School. Exercise: walks 2 times a week for 2 hours.   Social Determinants of Health   Financial Resource Strain: Not on file  Food Insecurity: Not on file  Transportation Needs: Not on file  Physical Activity: Not on file  Stress: Not on file  Social Connections: Not on file  Intimate Partner Violence: Not on file    Outpatient Medications Prior to Visit  Medication Sig Dispense Refill   Iron, Ferrous Sulfate, 325 (65 Fe) MG TABS Take 325 mg by mouth daily. 90 tablet 3   albuterol (VENTOLIN HFA) 108 (90 Base) MCG/ACT inhaler Inhale 2 puffs into the lungs every 6 (six) hours as needed for wheezing or shortness of breath. 8 g 0   ALPRAZolam (ALPRAZOLAM XR) 1 MG 24 hr  tablet TAKE ONE TABLET BY MOUTH DAILY AS NEEDED FOR ANXIETY 30 tablet 0   cetirizine (ZYRTEC) 10 MG tablet Take 1 tablet (10 mg total) by mouth as needed for allergies. 90 tablet 1   FLUoxetine (PROZAC) 40 MG capsule Take 1 capsule (40 mg total) by mouth daily. 90 capsule 1   HYDROcodone bit-homatropine (HYCODAN) 5-1.5 MG/5ML syrup Take 5 mLs by mouth at bedtime as needed for cough. 120 mL 0   hydrOXYzine (ATARAX/VISTARIL) 25 MG tablet Take 1 tablet (25 mg total) by mouth 3 (three) times daily as needed. Use in place of alprazolam. 90 tablet 1   rosuvastatin (CRESTOR) 5 MG tablet Take 1 tablet (5 mg total) by mouth daily. 90 tablet 3   Vitamin D, Ergocalciferol, (DRISDOL) 1.25 MG (50000 UNIT) CAPS capsule Take 1 capsule (50,000 Units total) by mouth every 7  (seven) days. (Patient not taking: Reported on 09/14/2021) 12 capsule 0   No facility-administered medications prior to visit.    Allergies  Allergen Reactions   Amoxicillin Itching and Rash    ROS Review of Systems  Constitutional: Negative.   HENT: Negative.    Eyes: Negative.   Respiratory: Negative.    Cardiovascular: Negative.   Gastrointestinal: Negative.   Genitourinary: Negative.   Musculoskeletal: Negative.   Skin: Negative.   Neurological: Negative.   Psychiatric/Behavioral: Negative.    All other systems reviewed and are negative.    Objective:    Physical Exam Vitals and nursing note reviewed.  Constitutional:      General: She is not in acute distress.    Appearance: Normal appearance. She is normal weight. She is not ill-appearing, toxic-appearing or diaphoretic.  Cardiovascular:     Rate and Rhythm: Normal rate and regular rhythm.     Heart sounds: Normal heart sounds. No murmur heard.   No friction rub. No gallop.  Pulmonary:     Effort: Pulmonary effort is normal. No respiratory distress.     Breath sounds: Normal breath sounds. No stridor. No wheezing, rhonchi or rales.  Chest:     Chest wall: No tenderness.  Skin:    General: Skin is warm and dry.  Neurological:     General: No focal deficit present.     Mental Status: She is alert and oriented to person, place, and time. Mental status is at baseline.  Psychiatric:        Mood and Affect: Mood normal.        Behavior: Behavior normal.        Thought Content: Thought content normal.        Judgment: Judgment normal.    BP 110/72   Pulse 90   Temp 98.2 F (36.8 C) (Temporal)   Resp 18   Ht 5' (1.524 m)   Wt 114 lb 6.4 oz (51.9 kg)   SpO2 98%   BMI 22.34 kg/m  Wt Readings from Last 3 Encounters:  09/14/21 114 lb 6.4 oz (51.9 kg)  03/16/21 127 lb (57.6 kg)  01/01/21 121 lb 6.4 oz (55.1 kg)     Health Maintenance Due  Topic Date Due   MAMMOGRAM  Never done    There are no  preventive care reminders to display for this patient.  Lab Results  Component Value Date   TSH 1.27 03/16/2021   Lab Results  Component Value Date   WBC 5.2 03/16/2021   HGB 12.1 03/16/2021   HCT 37.5 03/16/2021   MCV 74.9 (L) 03/16/2021   PLT 321.0  03/16/2021   Lab Results  Component Value Date   NA 138 03/16/2021   K 3.8 03/16/2021   CO2 25 03/16/2021   GLUCOSE 94 03/16/2021   BUN 11 03/16/2021   CREATININE 0.65 03/16/2021   BILITOT 0.5 03/16/2021   ALKPHOS 92 03/16/2021   AST 22 03/16/2021   ALT 14 03/16/2021   PROT 8.0 03/16/2021   ALBUMIN 4.0 03/16/2021   CALCIUM 9.1 03/16/2021   ANIONGAP 7 05/05/2015   GFR 102.05 03/16/2021   Lab Results  Component Value Date   CHOL 239 (H) 03/16/2021   Lab Results  Component Value Date   HDL 61.90 03/16/2021   Lab Results  Component Value Date   LDLCALC 105 (H) 08/25/2020   Lab Results  Component Value Date   TRIG 217.0 (H) 03/16/2021   Lab Results  Component Value Date   CHOLHDL 4 03/16/2021   Lab Results  Component Value Date   HGBA1C 5.7 03/16/2021      Assessment & Plan:   Problem List Items Addressed This Visit   None Visit Diagnoses     Flu vaccine need    -  Primary   Relevant Orders   Flu Vaccine QUAD 6+ mos PF IM (Fluarix Quad PF) (Completed)   Prediabetes       Relevant Orders   Comprehensive metabolic panel   Hemoglobin A1c   History of anemia       Relevant Orders   Comprehensive metabolic panel   CBC with Differential/Platelet   Vitamin D deficiency       Relevant Orders   Comprehensive metabolic panel   Vitamin D (25 hydroxy)   Mixed hyperlipidemia       Relevant Medications   rosuvastatin (CRESTOR) 5 MG tablet   Other Relevant Orders   Comprehensive metabolic panel   Lipid panel   Anxiety and depression       Relevant Medications   FLUoxetine (PROZAC) 40 MG capsule   hydrOXYzine (ATARAX/VISTARIL) 25 MG tablet   ALPRAZolam (ALPRAZOLAM XR) 1 MG 24 hr tablet   Allergic cough        Relevant Medications   albuterol (VENTOLIN HFA) 108 (90 Base) MCG/ACT inhaler   Seasonal allergies       Relevant Medications   cetirizine (ZYRTEC) 10 MG tablet   Dry eye       Relevant Orders   Antinuclear Antib (ANA)       Meds ordered this encounter  Medications   rosuvastatin (CRESTOR) 5 MG tablet    Sig: Take 1 tablet (5 mg total) by mouth daily.    Dispense:  90 tablet    Refill:  1    Order Specific Question:   Supervising Provider    Answer:   Neva Seat, JEFFREY R [[id number]]   FLUoxetine (PROZAC) 40 MG capsule    Sig: Take 1 capsule (40 mg total) by mouth daily.    Dispense:  90 capsule    Refill:  1    Order Specific Question:   Supervising Provider    Answer:   Neva Seat, JEFFREY R [[id number]]   hydrOXYzine (ATARAX/VISTARIL) 25 MG tablet    Sig: Take 1 tablet (25 mg total) by mouth 3 (three) times daily as needed. Use in place of alprazolam.    Dispense:  90 tablet    Refill:  1    Order Specific Question:   Supervising Provider    Answer:   Neva Seat, JEFFREY R [[id number]]   albuterol (VENTOLIN HFA) 108 (90  Base) MCG/ACT inhaler    Sig: Inhale 2 puffs into the lungs every 6 (six) hours as needed for wheezing or shortness of breath.    Dispense:  8 g    Refill:  5    Order Specific Question:   Supervising Provider    Answer:   Neva Seat, JEFFREY R [[id number]]   cetirizine (ZYRTEC) 10 MG tablet    Sig: Take 1 tablet (10 mg total) by mouth as needed for allergies.    Dispense:  90 tablet    Refill:  1    Order Specific Question:   Supervising Provider    Answer:   Neva Seat, JEFFREY R [[id number]]   ALPRAZolam (ALPRAZOLAM XR) 1 MG 24 hr tablet    Sig: Take 1 tablet (1 mg total) by mouth at bedtime as needed for anxiety.    Dispense:  30 tablet    Refill:  0    Order Specific Question:   Supervising Provider    Answer:   Neva Seat, JEFFREY R [[id number]]    Follow-up: Return in about 6 months (around 03/15/2022) for hld.   PLAN Can use hydroxyzine in half to full tabs up to tid to help with social  anxiety. Will collect ANA for dry eye. Can refer to ophthalmology if not positive. Labs collected. Will follow up with the patient as warranted. Refill meds as above Flu vaccine given. Patient encouraged to call clinic with any questions, comments, or concerns.  I spent 44 minutes with patient addressing chronic and acute concerns, coordinating labs, reviewing chart, and coordinating medications.  Erika Agee, NP

## 2021-09-15 ENCOUNTER — Ambulatory Visit: Payer: Managed Care, Other (non HMO) | Admitting: Registered Nurse

## 2021-09-16 LAB — ANTI-NUCLEAR AB-TITER (ANA TITER): ANA Titer 1: 1:1280 {titer} — ABNORMAL HIGH

## 2021-09-16 LAB — ANA: Anti Nuclear Antibody (ANA): POSITIVE — AB

## 2021-09-17 ENCOUNTER — Other Ambulatory Visit: Payer: Self-pay | Admitting: Registered Nurse

## 2021-09-17 DIAGNOSIS — R768 Other specified abnormal immunological findings in serum: Secondary | ICD-10-CM

## 2021-09-21 ENCOUNTER — Ambulatory Visit: Payer: Medicare Other | Admitting: Neurology

## 2021-10-26 ENCOUNTER — Encounter (INDEPENDENT_AMBULATORY_CARE_PROVIDER_SITE_OTHER): Payer: Self-pay

## 2021-11-01 NOTE — Progress Notes (Signed)
Office Visit Note  Patient: Erika Rogers             Date of Birth: 01-04-1971           MRN: [id number]             PCP: Janeece Agee, NP Referring: Janeece Agee, NP Visit Date: 11/02/2021  Subjective:  New Patient (Initial Visit) (Bil eye weakness)   History of Present Illness: Erika Rogers is a 50 y.o. female here for evaluation of suspected sjogren's syndrome with severe dry eyes and positive ANA. She has a history of chronic headaches since years ago seen neurology for this problem with periods of frequent migraine headaches.  She is also seen psychiatry for symptoms of anxiety depression and PTSD.  This has been a problem lately and she notices frequent tremor symptoms worsened with mood disturbance.  She takes the Xanax 1 mg daily pretty regularly. Symptoms vary in severity over time currently pretty active. For at least the past 2 years she also describes significant problems with eye irritation blurry vision or difficulty focusing.  She saw an eye doctor in [facility] last year was prescribed corrective lenses but noticed increased irritation when trying to use these and does not do so.  She uses eye care drops intermittently for the symptoms.  She denies any problem of eye infections or specific abrasions or other complications.  She also describes dry mouth symptoms she has had loss of numerous teeth within the past few years mouth stays very dry she drinks a lot of water to improve this.  She does not notice obvious swelling of any salivary glands and no history of stones. She denies any lymphadenopathy, joint swelling, or sensation changes in extremities.  She feels very cold frequently in her extremities but denies discoloration.  She had skin rash on her forearms last winter with small erythematous spots that resolved on its own.  Labs reviewed 09/2021 ANA 1:1280 nucleolar Vit D 24.91 CBC unermarkable CMP wnl  Activities of Daily Living:  Patient reports morning stiffness  for 15 minutes.   Patient Reports nocturnal pain.  Difficulty dressing/grooming: Denies Difficulty climbing stairs: Denies Difficulty getting out of chair: Denies Difficulty using hands for taps, buttons, cutlery, and/or writing: Reports  Review of Systems  Constitutional:  Positive for fatigue.  HENT:  Positive for mouth dryness.   Eyes:  Positive for dryness.  Respiratory:  Positive for shortness of breath.   Cardiovascular:  Negative for swelling in legs/feet.  Gastrointestinal:  Negative for constipation.  Endocrine: Positive for cold intolerance, heat intolerance, excessive thirst and increased urination.  Genitourinary:  Negative for difficulty urinating.  Musculoskeletal:  Positive for joint pain, gait problem, joint pain, muscle weakness and morning stiffness.  Skin:  Negative for rash.  Allergic/Immunologic: Positive for susceptible to infections.  Neurological:  Positive for dizziness, numbness, headaches and weakness.  Hematological:  Negative for bruising/bleeding tendency.  Psychiatric/Behavioral:  Positive for sleep disturbance.    PMFS History:  Patient Active Problem List   Diagnosis Date Noted   Positive ANA (antinuclear antibody) 11/02/2021   Dry mouth and eyes 11/02/2021   Microcytosis 05/15/2015   BRBPR (bright red blood per rectum) 05/03/2015   GERD 02/20/2009   DYSPHAGIA 02/20/2009    Past Medical History:  Diagnosis Date   Allergy    ASCUS of cervix with negative high risk HPV 09/2017   Depression    Migraine     Family History  Problem Relation Age of Onset  Diabetes Brother    Mental illness Brother    Past Surgical History:  Procedure Laterality Date   APPENDECTOMY     ESOPHAGOGASTRODUODENOSCOPY N/A 05/03/2015   Procedure: ESOPHAGOGASTRODUODENOSCOPY (EGD);  Surgeon: Beverley Fiedler, MD;  Location: [facility];  Service: Endoscopy;  Laterality: N/A;   NASAL SINUS SURGERY     TONSILLECTOMY     Social History   Social History Narrative    Single. Education: Grade School. Exercise: walks 2 times a week for 2 hours.   Immunization History  Administered Date(s) Administered   Influenza,inj,Quad PF,6+ Mos 09/15/2020, 09/14/2021   Moderna Sars-Covid-2 Vaccination 02/21/2020, 03/15/2020, 05/15/2021   Td 08/04/2015   Tdap 07/03/2015, 03/01/2016     Objective: Vital Signs: BP (!) 144/90 (BP Location: Right Arm, Patient Position: Sitting, Cuff Size: Normal)   Pulse 92   Resp 15   Ht 4\' 8"  (1.422 m)   Wt 116 lb (52.6 kg)   BMI 26.01 kg/m    Physical Exam HENT:     Right Ear: External ear normal.     Left Ear: External ear normal.     Mouth/Throat:     Pharynx: Oropharynx is clear.     Comments: Decreased salivary pooling Eyes:     Conjunctiva/sclera: Conjunctivae normal.  Cardiovascular:     Rate and Rhythm: Normal rate and regular rhythm.  Pulmonary:     Effort: Pulmonary effort is normal.     Breath sounds: Normal breath sounds.  Musculoskeletal:     Right lower leg: No edema.     Left lower leg: No edema.  Skin:    General: Skin is warm and dry.     Findings: No rash.  Neurological:     General: No focal deficit present.     Mental Status: She is alert.     Comments: 3+ reflexes b/l  Psychiatric:        Mood and Affect: Mood normal.     Musculoskeletal Exam:  Shoulders full ROM no tenderness or swelling Elbows full ROM no tenderness or swelling Wrists full ROM no tenderness or swelling Fingers full ROM no tenderness or swelling Knees full ROM no tenderness or swelling Ankles full ROM no tenderness or swelling   Investigation: No additional findings.  Imaging: No results found.  Recent Labs: Lab Results  Component Value Date   WBC 6.5 09/14/2021   HGB 12.2 09/14/2021   PLT 290.0 09/14/2021   NA 139 09/14/2021   K 4.4 09/14/2021   CL 101 09/14/2021   CO2 30 09/14/2021   GLUCOSE 70 09/14/2021   BUN 13 09/14/2021   CREATININE 0.70 09/14/2021   BILITOT 0.6 09/14/2021   ALKPHOS 96  09/14/2021   AST 24 09/14/2021   ALT 20 09/14/2021   PROT 7.4 09/14/2021   ALBUMIN 4.4 09/14/2021   CALCIUM 9.6 09/14/2021   GFRAA >60 05/05/2015    Speciality Comments: No specialty comments available.  Procedures:  No procedures performed Allergies: Amoxicillin   Assessment / Plan:     Visit Diagnoses: Positive ANA (antinuclear antibody) - Plan: RNP Antibody, Anti-Smith antibody, Sjogrens syndrome-A extractable nuclear antibody, Anti-DNA antibody, double-stranded, C3 and C4  Positive ANA at very high titer she has dry eye and mouth complaints otherwise symptoms are mostly nonspecific with headaches tremors anxiety and cold intolerance but nothing observable on exam.  We will check specific antibody markers and serum complement today.  Dry mouth and eyes  Discussed continued symptom management for dry eyes and mouth with lubricating  eyedrops as needed throughout the day possible ointment based drops for at night dryness.  Discussed continue to stay well-hydrated drinking water use of sugar-free gum and lozenges for saliva stimulation or Biotene base mouth rinses or sprays.  Do not see any specific indication for starting secretagogue or more aggressive treatment at this time.  If confirming Sjogren's syndrome would benefit from ophthalmology evaluation.  Orders: Orders Placed This Encounter  Procedures   RNP Antibody   Anti-Smith antibody   Sjogrens syndrome-A extractable nuclear antibody   Anti-DNA antibody, double-stranded   C3 and C4    No orders of the defined types were placed in this encounter.   Follow-Up Instructions: No follow-ups on file.   Fuller Plan, MD  Note - This record has been created using [employer].  Chart creation errors have been sought, but may not always  have been located. Such creation errors do not reflect on  the standard of medical care.

## 2021-11-02 ENCOUNTER — Ambulatory Visit (INDEPENDENT_AMBULATORY_CARE_PROVIDER_SITE_OTHER): Payer: Managed Care, Other (non HMO) | Admitting: Internal Medicine

## 2021-11-02 ENCOUNTER — Encounter: Payer: Self-pay | Admitting: Internal Medicine

## 2021-11-02 ENCOUNTER — Other Ambulatory Visit: Payer: Self-pay

## 2021-11-02 VITALS — BP 144/90 | HR 92 | Resp 15 | Ht <= 58 in | Wt 116.0 lb

## 2021-11-02 DIAGNOSIS — H04123 Dry eye syndrome of bilateral lacrimal glands: Secondary | ICD-10-CM | POA: Diagnosis not present

## 2021-11-02 DIAGNOSIS — R768 Other specified abnormal immunological findings in serum: Secondary | ICD-10-CM | POA: Insufficient documentation

## 2021-11-02 DIAGNOSIS — R682 Dry mouth, unspecified: Secondary | ICD-10-CM | POA: Diagnosis not present

## 2021-11-02 NOTE — Patient Instructions (Signed)
I suspect your symptoms are from a problem called Sjogren's syndrome which can cause loss of normal tear and saliva production. I am checking for specific antibody tests looking for this problem.  For dry eyes I recommend using lubricating eye drops several times per day especially before sleeping. You can use warm compresses for treatment if painful or irritated.  For dry mouth stay well hydrated with water. You can use sugar free gum or lozenges to stimulate saliva production. Biotene mouth wash or spray can also help stimulate saliva production.  Sjogren's Syndrome Sjgren's syndrome is an inflammatory disease in which the body's disease-fighting system (immune system) attacks the glands that produce tears (lacrimal glands) and the glands that produce saliva (salivary glands). This makes the eyes and mouth very dry. Sjgren's syndrome can also affect other parts of the body, causing dryness of the skin, nose, throat, and vagina. Sjgren's syndrome is a long-term (chronic) disorder that has no cure. In some cases, it is linked to other disorders (rheumatic disorders), such as rheumatoid arthritis and systemic lupus erythematosus (SLE). It may affect other parts of the body, such as the: Blood vessels. Joints. Lungs. Kidneys. Liver or pancreas. Brain, nerves, or spinal cord. What are the causes? The cause of this condition is not known. It may be passed along from parent to child (inherited), or it may be a symptom of a rheumatic disorder. What increases the risk? This condition is more likely to develop in: Women. People who are 15-57 years old and older. People who have recently had a viral infection or currently have a viral infection. What are the signs or symptoms? The main symptoms of this condition are: Dry mouth. This may include: A chalky feeling. Difficulty swallowing, speaking, or tasting. Frequent cavities in the teeth. Frequent mouth infections. Dry eyes. This may  include: Burning, redness, and itching. Blurry vision. Fluctuating vision. Light sensitivity. Other symptoms may include: Dryness of the skin and the inside of the nose. Eyelid infections. Vaginal dryness (if applicable). Joint pain and stiffness. Muscle pain and stiffness. How is this diagnosed? This condition is diagnosed based on: Your symptoms. Your medical history. A physical exam of your eyes and mouth. Tests, including: A Schirmer test. This tests your tear production. An eye exam that is done with a magnifying device (slit-lamp exam). An eye test that temporarily stains your eye with special dyes. This shows the extent of eye damage. Tests to check your salivary gland function. Biopsy. This is a removal of part of a salivary gland from inside your lower lip to be studied under a microscope. Chest X-rays. Blood or urine tests. How is this treated? There is no cure for this condition, but treatment can help you manage your symptoms. You may be asked to see a rheumatologist for further evaluation and treatment. This condition may be treated with: Medicines to help relieve pain and stiffness. Medicines to help relieve inflammation in your body (corticosteroids). These are usually for severe cases. Medicines to help reduce the activity of your immune system (immunosuppressants). These are usually prescribed by your health care provider or a rheumatologist. Moisture replacement therapies to help relieve dryness in your skin, mouth, and eyes. Dry eyes may be treated with: Eye drops or nasal sprays to improve dryness of the eyes. Surgery or insertion of plugs to close the lacrimal glands (punctal occlusion). This helps keep more natural tears in your eyes. Soft contact lenses or hard scleral lenses. These are occasionally used to protect the surface of  the eye. Biologic lubricating eye drops (serum tears). These are eye drops made from a person's own blood. They are used in some  people with severe dry eye. Follow these instructions at home: Eye care  Use eye drops and other medicines as told by your health care provider. Protect your eyes from the sun and wind with sunglasses or glasses. Blink at least 5-6 times a minute. Maintain properly humidified air. You may want to use a humidifier at home and at work. Avoid smoke. Mouth care Brush your teeth and floss after every meal. Chew sugar-free gum or suck on hard candy. This may help to relieve dry mouth. Use antimicrobial mouthwash daily. Take frequent sips of water or sugar-free drinks. Use saliva substitutes or lip balm as told by your health care provider. See your dentist every 6 months. General instructions  Take over-the-counter and prescription medicines only as told by your health care provider. Drink enough fluid to keep your urine pale yellow. Keep all follow-up visits. This is important. Contact a health care provider if: You have a fever. You have night sweats. You are always tired. You have unexplained weight loss. You develop itchy skin. You have red patches on your skin. You have a lump or swelling on your neck. Get help right away if: You develop severe eye pain. You develop sudden decreased vision. Summary Sjgren's syndrome is a disease in which the body's immune system attacks the glands that produce tears and the glands that produce saliva. This condition makes the eyes and mouth very dry. Sjgren's syndrome is a long-term (chronic) disorder. There is no cure for this condition, but treatment can help you manage your symptoms. The cause of this condition is not known. You may be asked to see a rheumatologist for further evaluation and treatment.

## 2021-11-03 LAB — ANTI-DNA ANTIBODY, DOUBLE-STRANDED: ds DNA Ab: <1 IU/mL

## 2021-11-03 LAB — ANTI-SMITH ANTIBODY: ENA SM Ab Ser-aCnc: <1 AI

## 2021-11-03 LAB — SJOGRENS SYNDROME-A EXTRACTABLE NUCLEAR ANTIBODY: SSA (Ro) (ENA) Antibody, IgG: <1 AI

## 2021-11-03 LAB — C3 AND C4
C3 Complement: 158 mg/dL (ref 83–193)
C4 Complement: 43 mg/dL (ref 15–57)

## 2021-11-03 LAB — RNP ANTIBODY: Ribonucleic Protein(ENA) Antibody, IgG: <1 AI

## 2021-11-04 ENCOUNTER — Other Ambulatory Visit: Payer: Self-pay | Admitting: Registered Nurse

## 2021-11-04 DIAGNOSIS — F419 Anxiety disorder, unspecified: Secondary | ICD-10-CM

## 2021-11-04 DIAGNOSIS — F32A Depression, unspecified: Secondary | ICD-10-CM

## 2021-11-04 NOTE — Telephone Encounter (Signed)
Patient is requesting a refill of the following medications: Requested Prescriptions   Pending Prescriptions Disp Refills   ALPRAZOLAM XR 1 MG 24 hr tablet [Pharmacy Med Name: ALPRAZolam XR 1 MG TABLET] 30 tablet     Sig: TAKE ONE TABLET BY MOUTH EVERY NIGHT AT BEDTIME AS NEEDED FOR ANXIETY    Date of patient request: 11/04/2021 Last office visit: 09/14/2021 Date of last refill: 09/14/2021 Last refill amount: 30 tablets 0 refills  Follow up time period per chart: 03/15/2022

## 2021-11-22 ENCOUNTER — Ambulatory Visit: Payer: Managed Care, Other (non HMO) | Admitting: Internal Medicine

## 2021-11-22 NOTE — Progress Notes (Signed)
Office Visit Note  Patient: Erika Rogers             Date of Birth: 1971-07-21           MRN: [id number]             PCP: Janeece Agee, NP Referring: Janeece Agee, NP Visit Date: 11/23/2021   Subjective:  Follow-up (Fatigue)   History of Present Illness: Erika Rogers is a 50 y.o. female here for follow up for severe dry eyes and dry mouth with positive ANA. Lab results at initial visit were negative for more specific antibody tests.  She continues having the dry eyes and mouth symptoms which she is treating with as needed use of the Systane eyedrops.  Besides that she complains more at this point of chronic fatigue.  Apparently is also been having additional symptoms with sleep talking or having periods of time with no recall.  She reports forgetting what she is doing in the middle of tasks on multiple occasions.  She has noticed some improvement with medications now taking 4 anxiety symptoms she feels this has improved her swallowing difficulty and feeling like a lump constricting her throat.  Previous HPI 11/02/21 [name] is a 50 y.o. female here for evaluation of suspected sjogren's syndrome with severe dry eyes and positive ANA. She has a history of chronic headaches since years ago seen neurology for this problem with periods of frequent migraine headaches.  She is also seen psychiatry for symptoms of anxiety depression and PTSD.  This has been a problem lately and she notices frequent tremor symptoms worsened with mood disturbance.  She takes the Xanax 1 mg daily pretty regularly. Symptoms vary in severity over time currently pretty active. For at least the past 2 years she also describes significant problems with eye irritation blurry vision or difficulty focusing.  She [place] an eye doctor [place] was prescribed corrective lenses but noticed increased irritation when trying to use these and does not do so.  She uses eye care drops intermittently for the symptoms.  She  denies any problem of eye infections or specific abrasions or other complications.  She also describes dry mouth symptoms she has had loss of numerous teeth within the past few years mouth stays very dry she drinks a lot of water to improve this.  She does not notice obvious swelling of any salivary glands and no history of stones. She denies any lymphadenopathy, joint swelling, or sensation changes in extremities.  She feels very cold frequently in her extremities but denies discoloration.  She had skin rash on her forearms last winter with small erythematous spots that resolved on its own.   Labs reviewed 09/2021 ANA 1:1280 nucleolar Vit D 24.91 CBC unermarkable CMP wnl   Review of Systems  Constitutional:  Positive for fatigue.  HENT:  Positive for mouth dryness.   Eyes:  Positive for dryness.  Respiratory:  Positive for shortness of breath.   Cardiovascular:  Negative for swelling in legs/feet.  Gastrointestinal:  Negative for constipation.  Endocrine: Positive for cold intolerance, heat intolerance and excessive thirst.  Genitourinary:  Negative for difficulty urinating.  Musculoskeletal:  Positive for joint pain, gait problem, joint pain, muscle weakness, morning stiffness and muscle tenderness.  Skin:  Negative for rash.  Allergic/Immunologic: Positive for susceptible to infections.  Neurological:  Positive for weakness.  Hematological:  Negative for bruising/bleeding tendency.  Psychiatric/Behavioral:  Positive for sleep disturbance.    PMFS History:  Patient  Active Problem List   Diagnosis Date Noted   Forgetfulness 11/23/2021   Positive ANA (antinuclear antibody) 11/02/2021   Dry mouth and eyes 11/02/2021   Microcytosis 05/15/2015   BRBPR (bright red blood per rectum) 05/03/2015   GERD 02/20/2009   DYSPHAGIA 02/20/2009    Past Medical History:  Diagnosis Date   Allergy    ASCUS of cervix with negative high risk HPV 09/2017   Depression    Migraine     Family  History  Problem Relation Age of Onset   Diabetes Brother    Mental illness Brother    Past Surgical History:  Procedure Laterality Date   APPENDECTOMY     ESOPHAGOGASTRODUODENOSCOPY N/A 05/03/2015   Procedure: ESOPHAGOGASTRODUODENOSCOPY (EGD);  Surgeon: Beverley Fiedler, MD;  Location: [facility] ENDOSCOPY;  Service: Endoscopy;  Laterality: N/A;   NASAL SINUS SURGERY     TONSILLECTOMY     Social History   Social History Narrative   Single. Education: Grade School. Exercise: walks 2 times a week for 2 hours.   Immunization History  Administered Date(s) Administered   Influenza,inj,Quad PF,6+ Mos 09/15/2020, 09/14/2021   Moderna Sars-Covid-2 Vaccination 02/21/2020, 03/15/2020, 05/15/2021   Td 08/04/2015   Tdap 07/03/2015, 03/01/2016     Objective: Vital Signs: BP 98/64 (BP Location: Left Arm, Patient Position: Sitting, Cuff Size: Normal)    Pulse 96    Resp 15    Ht 4\' 8"  (1.422 m)    Wt 120 lb (54.4 kg)    BMI 26.90 kg/m    Physical Exam Eyes:     Conjunctiva/sclera: Conjunctivae normal.  Musculoskeletal:     Right lower leg: No edema.     Left lower leg: No edema.  Skin:    General: Skin is warm and dry.     Findings: No rash.  Neurological:     Mental Status: She is alert.  Psychiatric:        Mood and Affect: Mood normal.     Musculoskeletal Exam:  Wrists full ROM no tenderness or swelling Fingers full ROM no tenderness or swelling    Investigation: No additional findings.  Imaging: No results found.  Recent Labs: Lab Results  Component Value Date   WBC 6.5 09/14/2021   HGB 12.2 09/14/2021   PLT 290.0 09/14/2021   NA 139 09/14/2021   K 4.4 09/14/2021   CL 101 09/14/2021   CO2 30 09/14/2021   GLUCOSE 70 09/14/2021   BUN 13 09/14/2021   CREATININE 0.70 09/14/2021   BILITOT 0.6 09/14/2021   ALKPHOS 96 09/14/2021   AST 24 09/14/2021   ALT 20 09/14/2021   PROT 7.4 09/14/2021   ALBUMIN 4.4 09/14/2021   CALCIUM 9.6 09/14/2021   GFRAA >60 05/05/2015     Speciality Comments: No specialty comments available.  Procedures:  No procedures performed Allergies: Amoxicillin   Assessment / Plan:     Visit Diagnoses: Dry mouth and eyes Positive ANA (antinuclear antibody)  No particular evidence for systemic disease activity and specific antibody markers were negative.  Reviewed options for symptomatic treatment of dry eyes and mouth including lubricating eyedrops recommending adding gel or ointment based treatment for nighttime.  Considering her other CNS active medications do not recommend trying medications for stimulating secretory function.  No particular reason to pursue minor salivary gland biopsy or further investigation since I do not see much other systemic symptom involvement to warrant trial of DMARD treatment.  Forgetfulness  Her biggest concern today was with this forgetfulness problem  also with fatigue describing some sleep abnormalities that sounds like sleep talking which is new for her.  Not having any focal neurologic deficits new headaches or other obvious changes.  She might benefit with neurology evaluation if the symptoms are ongoing or especially if worsening.  Orders: No orders of the defined types were placed in this encounter.  No orders of the defined types were placed in this encounter.    Follow-Up Instructions: No follow-ups on file.   Fuller Plan, MD  Note - This record has been created using [employer].  Chart creation errors have been sought, but may not always  have been located. Such creation errors do not reflect on  the standard of medical care.

## 2021-11-23 ENCOUNTER — Ambulatory Visit (INDEPENDENT_AMBULATORY_CARE_PROVIDER_SITE_OTHER): Payer: Managed Care, Other (non HMO) | Admitting: Internal Medicine

## 2021-11-23 ENCOUNTER — Encounter: Payer: Self-pay | Admitting: Internal Medicine

## 2021-11-23 ENCOUNTER — Other Ambulatory Visit: Payer: Self-pay

## 2021-11-23 VITALS — BP 98/64 | HR 96 | Resp 15 | Ht <= 58 in | Wt 120.0 lb

## 2021-11-23 DIAGNOSIS — R768 Other specified abnormal immunological findings in serum: Secondary | ICD-10-CM | POA: Diagnosis not present

## 2021-11-23 DIAGNOSIS — R6889 Other general symptoms and signs: Secondary | ICD-10-CM

## 2021-11-23 DIAGNOSIS — H04123 Dry eye syndrome of bilateral lacrimal glands: Secondary | ICD-10-CM

## 2021-11-23 DIAGNOSIS — R682 Dry mouth, unspecified: Secondary | ICD-10-CM

## 2021-11-25 ENCOUNTER — Other Ambulatory Visit: Payer: Self-pay | Admitting: Registered Nurse

## 2021-11-25 DIAGNOSIS — F419 Anxiety disorder, unspecified: Secondary | ICD-10-CM

## 2021-12-07 ENCOUNTER — Other Ambulatory Visit: Payer: Self-pay | Admitting: Registered Nurse

## 2021-12-07 DIAGNOSIS — F32A Depression, unspecified: Secondary | ICD-10-CM

## 2021-12-17 ENCOUNTER — Other Ambulatory Visit: Payer: Self-pay | Admitting: Registered Nurse

## 2021-12-17 DIAGNOSIS — D509 Iron deficiency anemia, unspecified: Secondary | ICD-10-CM

## 2021-12-21 ENCOUNTER — Ambulatory Visit: Payer: Managed Care, Other (non HMO) | Admitting: Internal Medicine

## 2022-01-06 ENCOUNTER — Other Ambulatory Visit: Payer: Self-pay | Admitting: Registered Nurse

## 2022-01-06 DIAGNOSIS — F32A Depression, unspecified: Secondary | ICD-10-CM

## 2022-01-06 NOTE — Telephone Encounter (Signed)
Patient is requesting a refill of the following medications: Requested Prescriptions   Signed Prescriptions Disp Refills   ALPRAZOLAM XR 1 MG 24 hr tablet 30 tablet 0    Sig: TAKE ONE TABLET BY MOUTH EVERY NIGHT AT BEDTIME AS NEEDED FOR ANXIETY    Authorizing Provider: Janeece Agee   Refill already sent

## 2022-03-14 ENCOUNTER — Other Ambulatory Visit: Payer: Self-pay | Admitting: Registered Nurse

## 2022-03-14 DIAGNOSIS — J302 Other seasonal allergic rhinitis: Secondary | ICD-10-CM

## 2022-03-14 DIAGNOSIS — F32A Depression, unspecified: Secondary | ICD-10-CM

## 2022-03-14 NOTE — Telephone Encounter (Signed)
Patient is requesting a refill of the following medications: ? ? ? ?Date of patient request: 03/14/22 ?Last office visit: 09/14/21 ?Date of last refill: 01/06/22 ?Last refill amount: 30, [facility] no info  ? ?

## 2022-03-15 ENCOUNTER — Ambulatory Visit (INDEPENDENT_AMBULATORY_CARE_PROVIDER_SITE_OTHER): Payer: Medicare Other | Admitting: Registered Nurse

## 2022-03-15 ENCOUNTER — Encounter: Payer: Self-pay | Admitting: Registered Nurse

## 2022-03-15 ENCOUNTER — Other Ambulatory Visit: Payer: Self-pay

## 2022-03-15 VITALS — BP 106/70 | HR 80 | Temp 98.2°F | Resp 19 | Ht <= 58 in | Wt 118.6 lb

## 2022-03-15 DIAGNOSIS — J302 Other seasonal allergic rhinitis: Secondary | ICD-10-CM | POA: Diagnosis not present

## 2022-03-15 DIAGNOSIS — E782 Mixed hyperlipidemia: Secondary | ICD-10-CM | POA: Diagnosis not present

## 2022-03-15 DIAGNOSIS — F32A Depression, unspecified: Secondary | ICD-10-CM

## 2022-03-15 DIAGNOSIS — Z1211 Encounter for screening for malignant neoplasm of colon: Secondary | ICD-10-CM

## 2022-03-15 DIAGNOSIS — Z862 Personal history of diseases of the blood and blood-forming organs and certain disorders involving the immune mechanism: Secondary | ICD-10-CM | POA: Diagnosis not present

## 2022-03-15 DIAGNOSIS — F419 Anxiety disorder, unspecified: Secondary | ICD-10-CM

## 2022-03-15 DIAGNOSIS — R7303 Prediabetes: Secondary | ICD-10-CM | POA: Diagnosis not present

## 2022-03-15 DIAGNOSIS — Z1231 Encounter for screening mammogram for malignant neoplasm of breast: Secondary | ICD-10-CM | POA: Diagnosis not present

## 2022-03-15 DIAGNOSIS — Z1159 Encounter for screening for other viral diseases: Secondary | ICD-10-CM

## 2022-03-15 LAB — LIPID PANEL
Cholesterol: 196 mg/dL (ref 0–200)
HDL: 88.8 mg/dL (ref >39.00)
LDL Cholesterol: 82 mg/dL (ref 0–99)
NonHDL: 106.82
Total CHOL/HDL Ratio: 2
Triglycerides: 125 mg/dL (ref 0.0–149.0)
VLDL: 25 mg/dL (ref 0.0–40.0)

## 2022-03-15 LAB — CBC WITH DIFFERENTIAL/PLATELET
Basophils Absolute: 0.1 10*3/uL (ref 0.0–0.1)
Basophils Relative: 0.9 % (ref 0.0–3.0)
Eosinophils Absolute: 0.5 10*3/uL (ref 0.0–0.7)
Eosinophils Relative: 9.8 % — ABNORMAL HIGH (ref 0.0–5.0)
HCT: 38 % (ref 36.0–46.0)
Hemoglobin: 12.2 g/dL (ref 12.0–15.0)
Lymphocytes Relative: 19.9 % (ref 12.0–46.0)
Lymphs Abs: 1.1 10*3/uL (ref 0.7–4.0)
MCHC: 32.2 g/dL (ref 30.0–36.0)
MCV: 77.8 fl — ABNORMAL LOW (ref 78.0–100.0)
Monocytes Absolute: 0.5 10*3/uL (ref 0.1–1.0)
Monocytes Relative: 8.6 % (ref 3.0–12.0)
Neutro Abs: 3.3 10*3/uL (ref 1.4–7.7)
Neutrophils Relative %: 60.8 % (ref 43.0–77.0)
Platelets: 242 10*3/uL (ref 150.0–400.0)
RBC: 4.89 Mil/uL (ref 3.87–5.11)
RDW: 15.8 % — ABNORMAL HIGH (ref 11.5–15.5)
WBC: 5.5 10*3/uL (ref 4.0–10.5)

## 2022-03-15 LAB — COMPREHENSIVE METABOLIC PANEL
ALT: 15 U/L (ref 0–35)
AST: 21 U/L (ref 0–37)
Albumin: 4.7 g/dL (ref 3.5–5.2)
Alkaline Phosphatase: 86 U/L (ref 39–117)
BUN: 14 mg/dL (ref 6–23)
CO2: 29 mEq/L (ref 19–32)
Calcium: 9.8 mg/dL (ref 8.4–10.5)
Chloride: 101 mEq/L (ref 96–112)
Creatinine, Ser: 0.71 mg/dL (ref 0.40–1.20)
GFR: 97.86 mL/min (ref >60.00)
Glucose, Bld: 90 mg/dL (ref 70–99)
Potassium: 4.6 mEq/L (ref 3.5–5.1)
Sodium: 137 mEq/L (ref 135–145)
Total Bilirubin: 1.1 mg/dL (ref 0.2–1.2)
Total Protein: 7.4 g/dL (ref 6.0–8.3)

## 2022-03-15 LAB — HEMOGLOBIN A1C: Hgb A1c MFr Bld: 6.1 % (ref 4.6–6.5)

## 2022-03-15 MED ORDER — AZELASTINE HCL 0.1 % NA SOLN: 1.0000 | Freq: Two times a day (BID) | NASAL | 12 refills | Status: AC

## 2022-03-15 MED ORDER — MONTELUKAST SODIUM 10 MG PO TABS: 10.0000 mg | ORAL_TABLET | Freq: Every day | ORAL | 3 refills | Status: DC

## 2022-03-15 MED ORDER — OLOPATADINE HCL 0.1 % OP SOLN: 1.0000 [drp] | Freq: Every day | OPHTHALMIC | 12 refills | Status: AC | PRN

## 2022-03-15 NOTE — Progress Notes (Signed)
? ?Established Patient Office Visit ? ?Subjective:  ?Patient ID: Erika Rogers, female    DOB: Mar 28, 1971  Age: 50 y.o. MRN: [id number] ? ?CC:  ?Chief Complaint  ?Patient presents with  ? Follow-up  ?  Patient states she is here for follow up for HLD. Patient states she is still having some allergy problems.  ? ? ?HPI ?Erika Rogers presents for follow up  ? ?Anxiety ?Alprazolam 1mg  xr po qd, atomoxetine 100mg  po qd, fluoxetine 40mg  po qd, hydroxyzine 25mg  po qd prn ?Good effect, no AE. Hopes to continue ?PDMP consulted. ? ?HLD ?Rosuvastatin 5mg  po qd ?Lab Results  ?Component Value Date  ? CHOL 168 09/14/2021  ? CHOL 239 (H) 03/16/2021  ? CHOL 196 08/25/2020  ? ?Lab Results  ?Component Value Date  ? HDL 71.40 09/14/2021  ? HDL 61.90 03/16/2021  ? HDL 51 08/25/2020  ? ?Lab Results  ?Component Value Date  ? LDLCALC 58 09/14/2021  ? LDLCALC 105 (H) 08/25/2020  ? LDLCALC 115 (H) 08/11/2020  ? ?Lab Results  ?Component Value Date  ? TRIG 192.0 (H) 09/14/2021  ? TRIG 217.0 (H) 03/16/2021  ? TRIG 281 (H) 08/25/2020  ? ?Lab Results  ?Component Value Date  ? CHOLHDL 2 09/14/2021  ? CHOLHDL 4 03/16/2021  ? CHOLHDL 3.8 08/25/2020  ? ?Lab Results  ?Component Value Date  ? LDLDIRECT 163.0 03/16/2021  ? ? ?Allergies ?Ongoing. Takes cetirizine 10mg  po qd, vicks vapo stick  ?Limited effect.  ?Notes with time outside has itchy eyes, pressure in ears, nasal congestion ?No wheezing or shortness of breath ? ?HM ?Due for mammo, Hep C screening, colon ca screen ?Will place orders today. ? ? ? ? ?Review of Systems  ?Constitutional: Negative.   ?HENT: Negative.    ?Eyes: Negative.   ?Respiratory: Negative.    ?Cardiovascular: Negative.   ?Gastrointestinal: Negative.   ?Genitourinary: Negative.   ?Musculoskeletal: Negative.   ?Skin: Negative.   ?Neurological: Negative.   ?Psychiatric/Behavioral: Negative.    ?All other systems reviewed and are negative. ? ?  ?Objective:  ?  ? ?BP 106/70   Pulse 80   Temp 98.2 ?F (36.8 ?C) (Temporal)   Resp 19   Ht  4\' 8"  (1.422 m)   Wt 118 lb 9.6 oz (53.8 kg)   SpO2 98%   BMI 26.59 kg/m?  ? ?Wt Readings from Last 3 Encounters:  ?03/15/22 118 lb 9.6 oz (53.8 kg)  ?11/23/21 120 lb (54.4 kg)  ?11/02/21 116 lb (52.6 kg)  ? ?Physical Exam ?Vitals and nursing note reviewed.  ?Constitutional:   ?   General: She is not in acute distress. ?   Appearance: Normal appearance. She is normal weight. She is not ill-appearing, toxic-appearing or diaphoretic.  ?Cardiovascular:  ?   Rate and Rhythm: Normal rate and regular rhythm.  ?   Heart sounds: Normal heart sounds. No murmur heard. ?  No friction rub. No gallop.  ?Pulmonary:  ?   Effort: Pulmonary effort is normal. No respiratory distress.  ?   Breath sounds: Normal breath sounds. No stridor. No wheezing, rhonchi or rales.  ?Chest:  ?   Chest wall: No tenderness.  ?Skin: ?   General: Skin is warm and dry.  ?Neurological:  ?   General: No focal deficit present.  ?   Mental Status: She is alert and oriented to person, place, and time. Mental status is at baseline.  ?Psychiatric:     ?   Mood and Affect: Mood normal.     ?  Behavior: Behavior normal.     ?   Thought Content: Thought content normal.     ?   Judgment: Judgment normal.  ? ? ?No results found for any visits on 03/15/22. ? ? ? ?The 10-year ASCVD risk score (Arnett DK, et al., 2019) is: 0.6% ? ?  ?Assessment & Plan:  ? ?Problem List Items Addressed This Visit   ? ?  ? Other  ? Seasonal allergies - Primary  ?  Add montelukast, azelastine, and patanol ?Reviewed risks, benefits, and side effects, pt voices understanding. ? ?  ?  ? Relevant Medications  ? montelukast (SINGULAIR) 10 MG tablet  ? azelastine (ASTELIN) 0.1 % nasal spray  ? olopatadine (PATANOL) 0.1 % ophthalmic solution  ? Anxiety and depression  ?  Well controlled, continue x 6 mo, follow up at that time ?  ?  ? Prediabetes  ?  Recheck labs. Adjust plan as warranted.  ?  ?  ? Relevant Orders  ? Comprehensive metabolic panel  ? Hemoglobin A1c  ? History of anemia  ?   Recheck labs. Adjust plan as warranted.  ?  ?  ? Relevant Orders  ? CBC with Differential/Platelet  ? Mixed hyperlipidemia  ?  Recheck labs. Adjust plan as warranted.  ?  ?  ? Relevant Orders  ? Lipid panel  ? ?Other Visit Diagnoses   ? ? Encounter for screening for other viral diseases      ? Relevant Orders  ? Hepatitis C Antibody  ? Screening mammogram for breast cancer      ? Relevant Orders  ? MM Digital Screening  ? Colon cancer screening      ? Relevant Orders  ? Cologuard  ? ?  ? ? ?Meds ordered this encounter  ?Medications  ? montelukast (SINGULAIR) 10 MG tablet  ?  Sig: Take 1 tablet (10 mg total) by mouth at bedtime.  ?  Dispense:  30 tablet  ?  Refill:  3  ?  Order Specific Question:   Supervising Provider  ?  Answer:   Neva Seat, JEFFREY R [[id number]]  ? azelastine (ASTELIN) 0.1 % nasal spray  ?  Sig: Place 1 spray into both nostrils 2 (two) times daily. Use in each nostril as directed  ?  Dispense:  30 mL  ?  Refill:  12  ?  Order Specific Question:   Supervising Provider  ?  Answer:   Neva Seat, JEFFREY R [[id number]]  ? olopatadine (PATANOL) 0.1 % ophthalmic solution  ?  Sig: Place 1 drop into both eyes daily as needed for allergies.  ?  Dispense:  5 mL  ?  Refill:  12  ?  Order Specific Question:   Supervising Provider  ?  Answer:   Neva Seat, JEFFREY R [[id number]]  ? ? ?Return in about 6 months (around 09/14/2022) for Chronic Conditions.  ? ?HM screenings ordered as above. ? ?Janeece Agee, NP ?

## 2022-03-15 NOTE — Patient Instructions (Addendum)
Erika Rogers -  ? ?Great to see you! I'm glad things are going well ? ?I have sent allergy meds - use as directed. They should help. Continue cetirizine. ? ?We will recheck labs today. I will call with concerns ? ?See you in 6 months! ? ?Thanks, ? ?Rich ? ? ? ?If you have lab work done today you will be contacted with your lab results within the next 2 weeks.  If you have not heard from [place] then please contact us. The fastest way to get your results is to register for My Chart. ? ? ?IF you received an x-ray today, you will receive an invoice from [facility]. Please contact [facility] at [phone] with questions or concerns regarding your invoice.  ? ?IF you received labwork today, you will receive an invoice from [facility]. Please contact [facility] at [phone] with questions or concerns regarding your invoice.  ? ?Our billing staff will not be able to assist you with questions regarding bills from these companies. ? ?You will be contacted with the lab results as soon as they are available. The fastest way to get your results is to activate your My Chart account. Instructions are located on the last page of this paperwork. If you have not heard from [place] regarding the results in 2 weeks, please contact this office. ?  ? ? ?

## 2022-03-15 NOTE — Assessment & Plan Note (Signed)
Add montelukast, azelastine, and patanol ?Reviewed risks, benefits, and side effects, pt voices understanding. ? ?

## 2022-03-15 NOTE — Assessment & Plan Note (Signed)
Well controlled, continue x 6 mo, follow up at that time ?

## 2022-03-15 NOTE — Assessment & Plan Note (Signed)
Recheck labs. Adjust plan as warranted.  ?

## 2022-03-15 NOTE — Assessment & Plan Note (Signed)
Recheck labs. Adjust plan as warranted.  ?

## 2022-03-16 LAB — HEPATITIS C ANTIBODY
Hepatitis C Ab: NONREACTIVE
SIGNAL TO CUT-OFF: 0.11 (ref <1.00)

## 2022-04-19 ENCOUNTER — Ambulatory Visit
Admission: RE | Admit: 2022-04-19 | Discharge: 2022-04-19 | Disposition: A | Discharge status: Home / Self Care | Payer: Medicare Other | Source: Ambulatory Visit | Attending: Registered Nurse | Admitting: Registered Nurse

## 2022-04-19 DIAGNOSIS — Z1231 Encounter for screening mammogram for malignant neoplasm of breast: Secondary | ICD-10-CM

## 2022-05-19 LAB — COLOGUARD

## 2022-06-06 LAB — COLOGUARD: COLOGUARD: NEGATIVE

## 2022-06-13 ENCOUNTER — Other Ambulatory Visit: Payer: Self-pay | Admitting: Registered Nurse

## 2022-06-21 ENCOUNTER — Other Ambulatory Visit: Payer: Self-pay | Admitting: Registered Nurse

## 2022-06-21 DIAGNOSIS — E782 Mixed hyperlipidemia: Secondary | ICD-10-CM

## 2022-07-01 ENCOUNTER — Other Ambulatory Visit: Payer: Self-pay | Admitting: Registered Nurse

## 2022-07-01 DIAGNOSIS — F32A Depression, unspecified: Secondary | ICD-10-CM

## 2022-07-01 DIAGNOSIS — F419 Anxiety disorder, unspecified: Secondary | ICD-10-CM

## 2022-07-04 NOTE — Telephone Encounter (Signed)
Last fill 03/15/22 per pdmp, #30 no refills. Last visit 03/15/22  No concerns on PDMP  Jari Sportsman, NP

## 2022-07-09 ENCOUNTER — Other Ambulatory Visit: Payer: Self-pay | Admitting: Registered Nurse

## 2022-07-09 DIAGNOSIS — J302 Other seasonal allergic rhinitis: Secondary | ICD-10-CM

## 2022-09-05 ENCOUNTER — Telehealth: Payer: Self-pay | Admitting: Registered Nurse

## 2022-09-05 ENCOUNTER — Other Ambulatory Visit: Payer: Self-pay

## 2022-09-05 DIAGNOSIS — J302 Other seasonal allergic rhinitis: Secondary | ICD-10-CM

## 2022-09-05 MED ORDER — CETIRIZINE HCL 10 MG PO TABS: 10.0000 mg | ORAL_TABLET | Freq: Every day | ORAL | 1 refills | Status: DC

## 2022-09-05 NOTE — Telephone Encounter (Signed)
Encourage patient to contact the pharmacy for refills or they can request refills through [facility]  (Please schedule appointment if patient has not been seen in over a year)    [place] THIS SENT TO:  Annita Brod [phone]  MEDICATION NAME & DOSE: cetirizine 10 mg  NOTES/COMMENTS FROM PATIENT:  pt has appointment to establish new pcp with [facility] office please notify patient: It takes 48-72 hours to process rx refill requests Ask patient to call pharmacy to ensure rx is ready before heading there.

## 2022-09-05 NOTE — Telephone Encounter (Signed)
Refill sent to pharmacy.   

## 2022-09-10 ENCOUNTER — Other Ambulatory Visit: Payer: Self-pay | Admitting: Family Medicine

## 2022-09-16 ENCOUNTER — Encounter: Payer: Self-pay | Admitting: Family Medicine

## 2022-09-16 ENCOUNTER — Ambulatory Visit (INDEPENDENT_AMBULATORY_CARE_PROVIDER_SITE_OTHER): Payer: Medicare Other | Admitting: Family Medicine

## 2022-09-16 VITALS — BP 116/68 | HR 70 | Temp 97.6°F | Ht <= 58 in | Wt 124.0 lb

## 2022-09-16 DIAGNOSIS — F419 Anxiety disorder, unspecified: Secondary | ICD-10-CM | POA: Diagnosis not present

## 2022-09-16 DIAGNOSIS — F32A Depression, unspecified: Secondary | ICD-10-CM

## 2022-09-16 DIAGNOSIS — R7303 Prediabetes: Secondary | ICD-10-CM | POA: Diagnosis not present

## 2022-09-16 DIAGNOSIS — Z23 Encounter for immunization: Secondary | ICD-10-CM | POA: Diagnosis not present

## 2022-09-16 DIAGNOSIS — J3089 Other allergic rhinitis: Secondary | ICD-10-CM

## 2022-09-16 DIAGNOSIS — E782 Mixed hyperlipidemia: Secondary | ICD-10-CM

## 2022-09-16 NOTE — Patient Instructions (Signed)
Thank you for trusting [place] with your health care.  Continue your current medications.    Continue seeing your therapist.  Take the alprazolam only as needed.  Follow-up in 4 weeks for allergies

## 2022-09-16 NOTE — Progress Notes (Signed)
New Patient Office Visit  Subjective    Patient ID: Erika Rogers, female    DOB: 02/13/1971  Age: 51 y.o. MRN: [id number]  CC:  Chief Complaint  Patient presents with   Establish Care    Thinks she may have allergies despite taking allergy medication. States whenever she goes outside for a long period of time she is still sneezing and itching.    HPI Erika Rogers presents to establish care  Psychiatrist at [facility] took her out on disability for anxiety. She is seeing a therapist. States she takes alprazolam prn and not daily.   Complains of allergy symptoms even though she is taking allergy medications. Reports sneezing, itching, and occasional shortness of breath with certain chemicals.   Denies fever, chills, dizziness, chest pain, palpitations, abdominal pain, N/V/D, urinary symptoms, LE edema.   Hx of prediabetes and HL.       Past Medical History:  Diagnosis Date   Allergy    ASCUS of cervix with negative high risk HPV 09/2017   Depression    Migraine     Past Surgical History:  Procedure Laterality Date   APPENDECTOMY     ESOPHAGOGASTRODUODENOSCOPY N/A 05/03/2015   Procedure: ESOPHAGOGASTRODUODENOSCOPY (EGD);  Surgeon: Beverley Fiedler, MD;  Location: [facility] ENDOSCOPY;  Service: Endoscopy;  Laterality: N/A;   NASAL SINUS SURGERY     TONSILLECTOMY      Family History  Problem Relation Age of Onset   Diabetes Brother    Mental illness Brother     Social History   Socioeconomic History   Marital status: Married    Spouse name: Not on file   Number of children: 4   Years of education: Not on file   Highest education level: Not on file  Occupational History   Not on file  Tobacco Use   Smoking status: Never   Smokeless tobacco: Never  Vaping Use   Vaping Use: Never used  Substance and Sexual Activity   Alcohol use: No    Alcohol/week: 0.0 standard drinks of alcohol   Drug use: No   Sexual activity: Yes    Birth control/protection: None    Comment:  1st intercourse 32 yo-1 partner  Other Topics Concern   Not on file  Social History Narrative   Single. Education: Grade School. Exercise: walks 2 times a week for 2 hours.   Social Determinants of Health   Financial Resource Strain: Not on file  Food Insecurity: Not on file  Transportation Needs: Not on file  Physical Activity: Not on file  Stress: Not on file  Social Connections: Not on file  Intimate Partner Violence: Not on file    ROS      Objective    BP 116/68 (BP Location: Left Arm, Patient Position: Sitting, Cuff Size: Large)   Pulse 70   Temp 97.6 F (36.4 C) (Temporal)   Ht 4\' 8"  (1.422 m)   Wt 124 lb (56.2 kg)   SpO2 99%   BMI 27.80 kg/m   Physical Exam Alert and in no distress. Tympanic membranes and canals are normal. Pharyngeal area is normal. Neck is supple without adenopathy or thyromegaly. Cardiac exam shows a regular sinus rhythm without murmurs or gallops. Lungs are clear to auscultation. Skin is warm and dry. No rash.       Assessment & Plan:   Problem List Items Addressed This Visit       Other   Anxiety and depression   Mixed hyperlipidemia  Prediabetes   Other Visit Diagnoses     Environmental and seasonal allergies    -  Primary   Need for influenza vaccination       Relevant Orders   Flu Vaccine QUAD 76mo+IM (Fluarix, Fluzone & Alfiuria Quad PF) (Completed)      Continue Astelin nasal spray, allergy eye drops, Singulair and Zyrtec. Referral to allergist offered and she declines for now. Recommend avoiding triggers and wearing an N-95 when exposed to chemicals for cleaning.  Continue seeing psychiatrist and therapist.  Follow up in 4 wks.   Return in about 4 weeks (around 10/14/2022).   Harland Dingwall, NP-C

## 2022-09-20 ENCOUNTER — Ambulatory Visit: Payer: Medicare Other | Admitting: Registered Nurse

## 2022-10-18 ENCOUNTER — Ambulatory Visit (INDEPENDENT_AMBULATORY_CARE_PROVIDER_SITE_OTHER): Payer: Medicare Other | Admitting: Family Medicine

## 2022-10-18 ENCOUNTER — Encounter: Payer: Self-pay | Admitting: Family Medicine

## 2022-10-18 VITALS — BP 98/60 | HR 80 | Temp 97.9°F | Ht <= 58 in | Wt 126.0 lb

## 2022-10-18 DIAGNOSIS — J3089 Other allergic rhinitis: Secondary | ICD-10-CM

## 2022-10-18 DIAGNOSIS — F32A Depression, unspecified: Secondary | ICD-10-CM

## 2022-10-18 DIAGNOSIS — F419 Anxiety disorder, unspecified: Secondary | ICD-10-CM

## 2022-10-18 DIAGNOSIS — R7303 Prediabetes: Secondary | ICD-10-CM

## 2022-10-18 LAB — CBC WITH DIFFERENTIAL/PLATELET
Basophils Absolute: 0 10*3/uL (ref 0.0–0.1)
Basophils Relative: 0.6 % (ref 0.0–3.0)
Eosinophils Absolute: 0.3 10*3/uL (ref 0.0–0.7)
Eosinophils Relative: 6.2 % — ABNORMAL HIGH (ref 0.0–5.0)
HCT: 37.6 % (ref 36.0–46.0)
Hemoglobin: 11.9 g/dL — ABNORMAL LOW (ref 12.0–15.0)
Lymphocytes Relative: 44.5 % (ref 12.0–46.0)
Lymphs Abs: 2.3 10*3/uL (ref 0.7–4.0)
MCHC: 31.6 g/dL (ref 30.0–36.0)
MCV: 78.8 fl (ref 78.0–100.0)
Monocytes Absolute: 0.5 10*3/uL (ref 0.1–1.0)
Monocytes Relative: 9 % (ref 3.0–12.0)
Neutro Abs: 2.1 10*3/uL (ref 1.4–7.7)
Neutrophils Relative %: 39.7 % — ABNORMAL LOW (ref 43.0–77.0)
Platelets: 244 10*3/uL (ref 150.0–400.0)
RBC: 4.78 Mil/uL (ref 3.87–5.11)
RDW: 15.1 % (ref 11.5–15.5)
WBC: 5.3 10*3/uL (ref 4.0–10.5)

## 2022-10-18 LAB — COMPREHENSIVE METABOLIC PANEL
ALT: 11 U/L (ref 0–35)
AST: 17 U/L (ref 0–37)
Albumin: 4.1 g/dL (ref 3.5–5.2)
Alkaline Phosphatase: 83 U/L (ref 39–117)
BUN: 18 mg/dL (ref 6–23)
CO2: 27 mEq/L (ref 19–32)
Calcium: 8.7 mg/dL (ref 8.4–10.5)
Chloride: 108 mEq/L (ref 96–112)
Creatinine, Ser: 1.03 mg/dL (ref 0.40–1.20)
GFR: 62.36 mL/min (ref >60.00)
Glucose, Bld: 74 mg/dL (ref 70–99)
Potassium: 3.7 mEq/L (ref 3.5–5.1)
Sodium: 141 mEq/L (ref 135–145)
Total Bilirubin: 0.7 mg/dL (ref 0.2–1.2)
Total Protein: 7 g/dL (ref 6.0–8.3)

## 2022-10-18 LAB — TSH: TSH: 1.17 u[IU]/mL (ref 0.35–5.50)

## 2022-10-18 LAB — HEMOGLOBIN A1C: Hgb A1c MFr Bld: 5.9 % (ref 4.6–6.5)

## 2022-10-18 MED ORDER — FLUTICASONE PROPIONATE 50 MCG/ACT NA SUSP: 2.0000 | Freq: Every day | NASAL | 6 refills | Status: DC

## 2022-10-18 NOTE — Progress Notes (Signed)
Subjective:     Patient ID: Erika Rogers, female    DOB: [birth year], 51 y.o.   MRN: [id number]  Chief Complaint  Patient presents with   Follow-up    4 week f/u for allergies, states they are doing better than before. Was outside yesterday raking leaves and it caused her nose itchy and running along with hard to breathe.     HPI Patient is in today for a 4 wk follow up on allergies. States she has been doing much better but blowing leaves 2 days ago flared up her allergies.   No fever, chills, chest pain, shortness of breath, N/V/D.   She stopped alprazolam and her memory and mood has improved.     Health Maintenance Due  Topic Date Due   Medicare Annual Wellness (AWV)  Never done   COLONOSCOPY (Pts 45-64yrs Insurance coverage will need to be confirmed)  Never done    Past Medical History:  Diagnosis Date   Allergy    ASCUS of cervix with negative high risk HPV 09/2017   Depression    Migraine     Past Surgical History:  Procedure Laterality Date   APPENDECTOMY     ESOPHAGOGASTRODUODENOSCOPY N/A 05/03/2015   Procedure: ESOPHAGOGASTRODUODENOSCOPY (EGD);  Surgeon: Beverley Fiedler, MD;  Location: [facility] ENDOSCOPY;  Service: Endoscopy;  Laterality: N/A;   NASAL SINUS SURGERY     TONSILLECTOMY      Family History  Problem Relation Age of Onset   Diabetes Brother    Mental illness Brother     Social History   Socioeconomic History   Marital status: Married    Spouse name: Not on file   Number of children: 4   Years of education: Not on file   Highest education level: Not on file  Occupational History   Not on file  Tobacco Use   Smoking status: Never   Smokeless tobacco: Never  Vaping Use   Vaping Use: Never used  Substance and Sexual Activity   Alcohol use: No    Alcohol/week: 0.0 standard drinks of alcohol   Drug use: No   Sexual activity: Yes    Birth control/protection: None    Comment: 1st intercourse 80 yo-1 partner  Other Topics Concern   Not on file   Social History Narrative   Single. Education: Grade School. Exercise: walks 2 times a week for 2 hours.   Social Determinants of Health   Financial Resource Strain: Not on file  Food Insecurity: Not on file  Transportation Needs: Not on file  Physical Activity: Not on file  Stress: Not on file  Social Connections: Not on file  Intimate Partner Violence: Not on file    Outpatient Medications Prior to Visit  Medication Sig Dispense Refill   acetaminophen (TYLENOL) 325 MG tablet Take 650 mg by mouth every 6 (six) hours as needed.     azelastine (ASTELIN) 0.1 % nasal spray Place 1 spray into both nostrils 2 (two) times daily. Use in each nostril as directed 30 mL 12   cetirizine (ZYRTEC) 10 MG tablet Take 1 tablet (10 mg total) by mouth daily. 90 tablet 1   FEROSUL 325 (65 Fe) MG tablet TAKE ONE TABLET BY MOUTH DAILY 90 tablet 3   montelukast (SINGULAIR) 10 MG tablet TAKE ONE TABLET BY MOUTH AT BEDTIME 30 tablet 3   olopatadine (PATANOL) 0.1 % ophthalmic solution Place 1 drop into both eyes daily as needed for allergies. 5 mL 12   rosuvastatin (CRESTOR)  5 MG tablet TAKE ONE TABLET BY MOUTH DAILY 90 tablet 1   ALPRAZolam (ALPRAZOLAM XR) 1 MG 24 hr tablet TAKE ONE TABLET BY MOUTH EVERY NIGHT AT BEDTIME AS NEEDED FOR ANXIETY 30 tablet 1   No facility-administered medications prior to visit.    Allergies  Allergen Reactions   Other Hives, Swelling and Rash    Lysol and Bleach   Beef-Derived Products Itching and Nausea And Vomiting   Shellfish Allergy Itching and Nausea And Vomiting   Amoxicillin Itching and Rash    ROS     Objective:    Physical Exam Constitutional:      General: She is not in acute distress.    Appearance: She is not ill-appearing.  HENT:     Nose: Congestion present.     Mouth/Throat:     Mouth: Mucous membranes are moist.  Eyes:     Extraocular Movements: Extraocular movements intact.     Conjunctiva/sclera: Conjunctivae normal.     Pupils: Pupils  are equal, round, and reactive to light.  Cardiovascular:     Rate and Rhythm: Normal rate and regular rhythm.  Pulmonary:     Effort: Pulmonary effort is normal.     Breath sounds: Normal breath sounds.  Neurological:     General: No focal deficit present.     Mental Status: She is alert and oriented to person, place, and time.  Psychiatric:        Mood and Affect: Mood normal.        Behavior: Behavior normal.     BP 98/60 (BP Location: Left Arm, Patient Position: Sitting, Cuff Size: Large)   Pulse 80   Temp 97.9 F (36.6 C) (Temporal)   Ht 4\' 8"  (1.422 m)   Wt 126 lb (57.2 kg)   SpO2 98%   BMI 28.25 kg/m  Wt Readings from Last 3 Encounters:  10/18/22 126 lb (57.2 kg)  09/16/22 124 lb (56.2 kg)  03/15/22 118 lb 9.6 oz (53.8 kg)       Assessment & Plan:   Problem List Items Addressed This Visit       Other   Anxiety and depression   Environmental and seasonal allergies - Primary   Relevant Medications   fluticasone (FLONASE) 50 MCG/ACT nasal spray   Prediabetes   Relevant Orders   CBC with Differential/Platelet (Completed)   Comprehensive metabolic panel (Completed)   Hemoglobin A1c (Completed)   TSH (Completed)   Allergies currently flared up due to blowing leaves. Recommend she continue her usual allergy medications and Flonase prescribed.  Hx of prediabetes- check labs and follow up. Discussed low carbohydrate diet.  Mood and memory improved since stopping alprazolam.  Follow up in 6 months.   I have discontinued Meleah Dobkins's ALPRAZolam XR. I am also having her start on fluticasone. Additionally, I am having her maintain her acetaminophen, FeroSul, azelastine, olopatadine, rosuvastatin, montelukast, and cetirizine.  Meds ordered this encounter  Medications   fluticasone (FLONASE) 50 MCG/ACT nasal spray    Sig: Place 2 sprays into both nostrils daily.    Dispense:  16 g    Refill:  6    Order Specific Question:   Supervising Provider    Answer:    Pricilla Holm A [id number]

## 2022-10-18 NOTE — Patient Instructions (Signed)
Go to the lab on the first floor before you leave today.   Start Flonase nasal spray for allergies.   Prediabetes Eating Plan Prediabetes is a condition that causes blood sugar (glucose) levels to be higher than normal. This increases the risk for developing type 2 diabetes (type 2 diabetes mellitus). Working with a health care provider or nutrition specialist (dietitian) to make diet and lifestyle changes can help prevent the onset of diabetes. These changes may help you: Control your blood glucose levels. Improve your cholesterol levels. Manage your blood pressure. What are tips for following this plan? Reading food labels Read food labels to check the amount of fat, salt (sodium), and sugar in prepackaged foods. Avoid foods that have: Saturated fats. Trans fats. Added sugars. Avoid foods that have more than 300 milligrams (mg) of sodium per serving. Limit your sodium intake to less than 2,300 mg each day. Shopping Avoid buying pre-made and processed foods. Avoid buying drinks with added sugar. Cooking Cook with olive oil. Do not use butter, lard, or ghee. Bake, broil, grill, steam, or boil foods. Avoid frying. Meal planning  Work with your dietitian to create an eating plan that is right for you. This may include tracking how many calories you take in each day. Use a food diary, notebook, or mobile application to track what you eat at each meal. Consider following a Mediterranean diet. This includes: Eating several servings of fresh fruits and vegetables each day. Eating fish at least twice a week. Eating one serving each day of whole grains, beans, nuts, and seeds. Using olive oil instead of other fats. Limiting alcohol. Limiting red meat. Using nonfat or low-fat dairy products. Consider following a plant-based diet. This includes dietary choices that focus on eating mostly vegetables and fruit, grains, beans, nuts, and seeds. If you have high blood pressure, you may need to  limit your sodium intake or follow a diet such as the DASH (Dietary Approaches to Stop Hypertension) eating plan. The DASH diet aims to lower high blood pressure. Lifestyle Set weight loss goals with help from your health care team. It is recommended that most people with prediabetes lose 7% of their body weight. Exercise for at least 30 minutes 5 or more days a week. Attend a support group or seek support from a mental health counselor. Take over-the-counter and prescription medicines only as told by your health care provider. What foods are recommended? Fruits Berries. Bananas. Apples. Oranges. Grapes. Papaya. Mango. Pomegranate. Kiwi. Grapefruit. Cherries. Vegetables Lettuce. Spinach. Peas. Beets. Cauliflower. Cabbage. Broccoli. Carrots. Tomatoes. Squash. Eggplant. Herbs. Peppers. Onions. Cucumbers. Brussels sprouts. Grains Whole grains, such as whole-wheat or whole-grain breads, crackers, cereals, and pasta. Unsweetened oatmeal. Bulgur. Barley. Quinoa. Brown rice. Corn or whole-wheat flour tortillas or taco shells. Meats and other proteins Seafood. Poultry without skin. Lean cuts of pork and beef. Tofu. Eggs. Nuts. Beans. Dairy Low-fat or fat-free dairy products, such as yogurt, cottage cheese, and cheese. Beverages Water. Tea. Coffee. Sugar-free or diet soda. Seltzer water. Low-fat or nonfat milk. Milk alternatives, such as soy or almond milk. Fats and oils Olive oil. Canola oil. Sunflower oil. Grapeseed oil. Avocado. Walnuts. Sweets and desserts Sugar-free or low-fat pudding. Sugar-free or low-fat ice cream and other frozen treats. Seasonings and condiments Herbs. Sodium-free spices. Mustard. Relish. Low-salt, low-sugar ketchup. Low-salt, low-sugar barbecue sauce. Low-fat or fat-free mayonnaise. The items listed above may not be a complete list of recommended foods and beverages. Contact a dietitian for more information. What foods are not recommended?  Fruits Fruits canned with  syrup. Vegetables Canned vegetables. Frozen vegetables with butter or cream sauce. Grains Refined white flour and flour products, such as bread, pasta, snack foods, and cereals. Meats and other proteins Fatty cuts of meat. Poultry with skin. Breaded or fried meat. Processed meats. Dairy Full-fat yogurt, cheese, or milk. Beverages Sweetened drinks, such as iced tea and soda. Fats and oils Butter. Lard. Ghee. Sweets and desserts Baked goods, such as cake, cupcakes, pastries, cookies, and cheesecake. Seasonings and condiments Spice mixes with added salt. Ketchup. Barbecue sauce. Mayonnaise. The items listed above may not be a complete list of foods and beverages that are not recommended. Contact a dietitian for more information. Where to find more information American Diabetes Association: www.diabetes.org Summary You may need to make diet and lifestyle changes to help prevent the onset of diabetes. These changes can help you control blood sugar, improve cholesterol levels, and manage blood pressure. Set weight loss goals with help from your health care team. It is recommended that most people with prediabetes lose 7% of their body weight. Consider following a Mediterranean diet. This includes eating plenty of fresh fruits and vegetables, whole grains, beans, nuts, seeds, fish, and low-fat dairy, and using olive oil instead of other fats. This information is not intended to replace advice given to you by your health care provider. Make sure you discuss any questions you have with your health care provider. Document Revised: 02/20/2020 Document Reviewed: 02/20/2020 Elsevier Patient Education  2023 [employer].

## 2022-11-18 ENCOUNTER — Other Ambulatory Visit: Payer: Self-pay

## 2022-11-18 DIAGNOSIS — J302 Other seasonal allergic rhinitis: Secondary | ICD-10-CM

## 2022-11-18 MED ORDER — MONTELUKAST SODIUM 10 MG PO TABS: 10.0000 mg | ORAL_TABLET | Freq: Every day | ORAL | 3 refills | Status: DC

## 2022-11-18 NOTE — Telephone Encounter (Signed)
Incoming fax from [facility] to refill Singular. Rx sent

## 2022-11-26 IMAGING — MG MM DIGITAL SCREENING BILAT W/ TOMO AND CAD
8 series · 9 of 24 positions shown · non-contrast
Comparison: None available.

CLINICAL DATA: Screening.

EXAM:
DIGITAL SCREENING BILATERAL MAMMOGRAM WITH TOMOSYNTHESIS AND CAD
TECHNIQUE: Bilateral screening digital craniocaudal and mediolateral oblique
mammograms were obtained. Bilateral screening digital breast
tomosynthesis was performed. The images were evaluated with
computer-aided detection.

[R CC synth-2D]
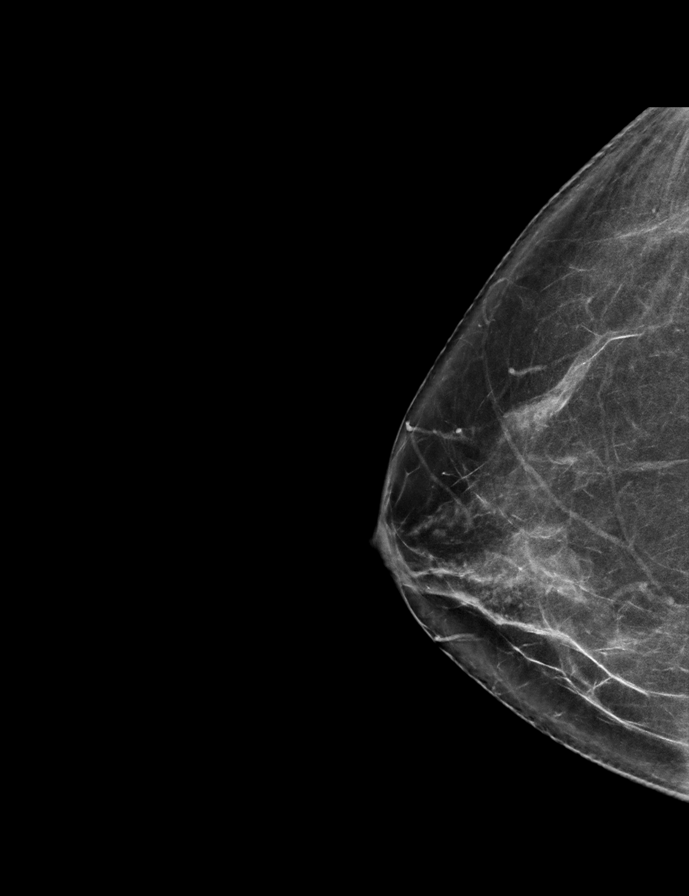

[R MLO synth-2D]
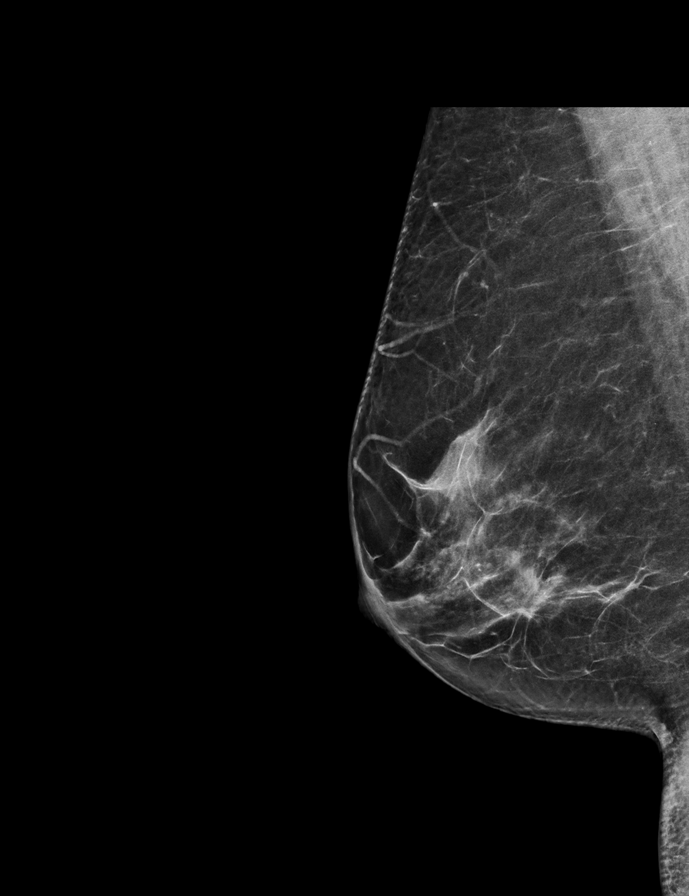

[L MLO synth-2D]
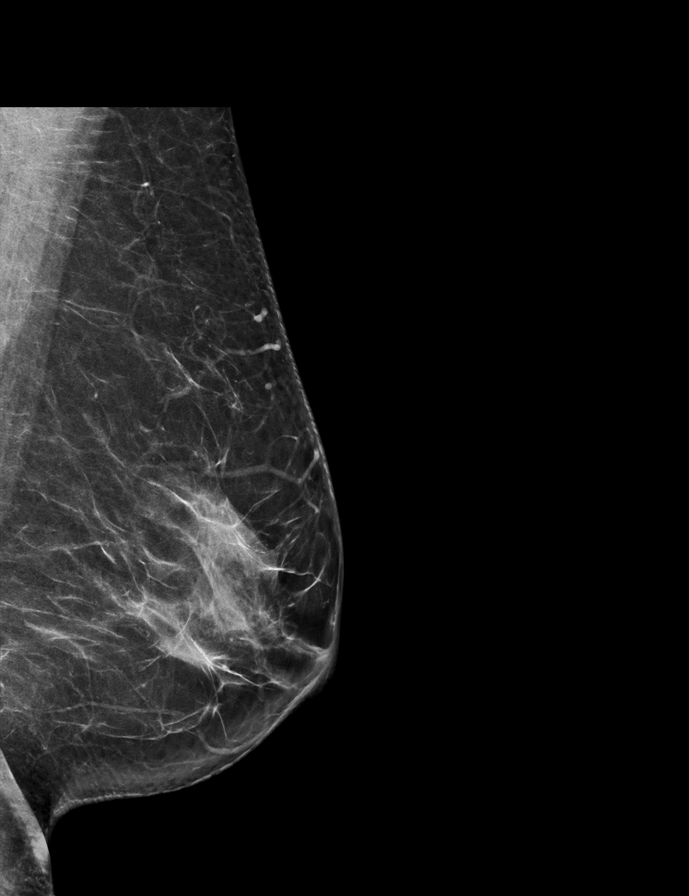

[L CC synth-2D]
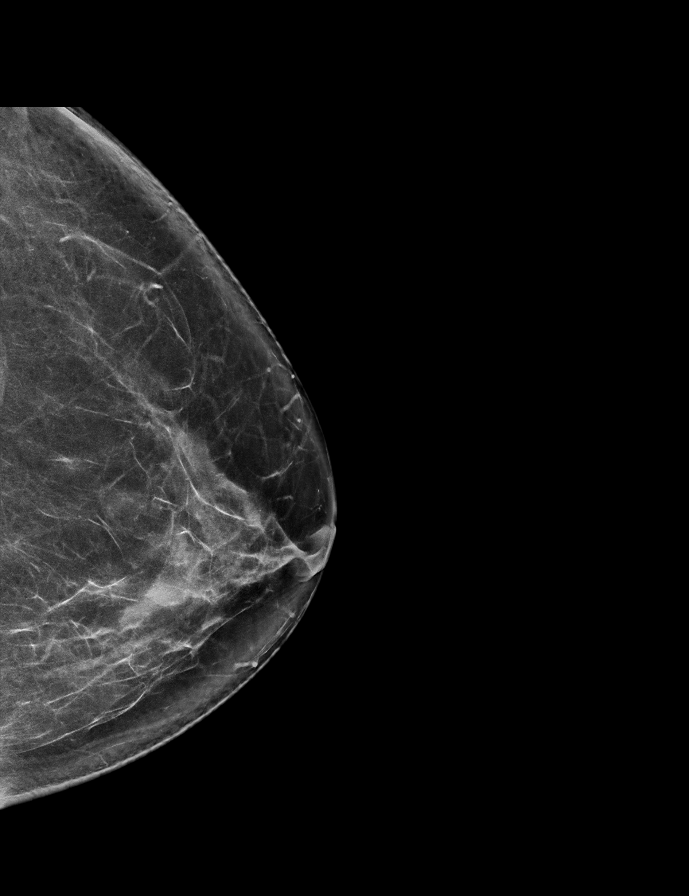

[R CC tomo · 2 of 67 frames shown]
[frame 22/67]
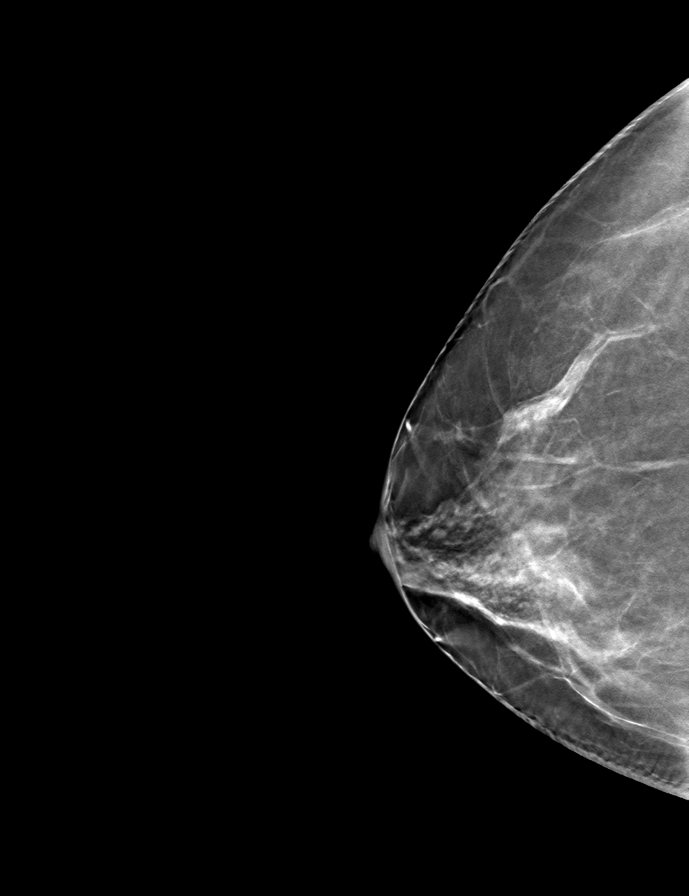
[frame 34/67]
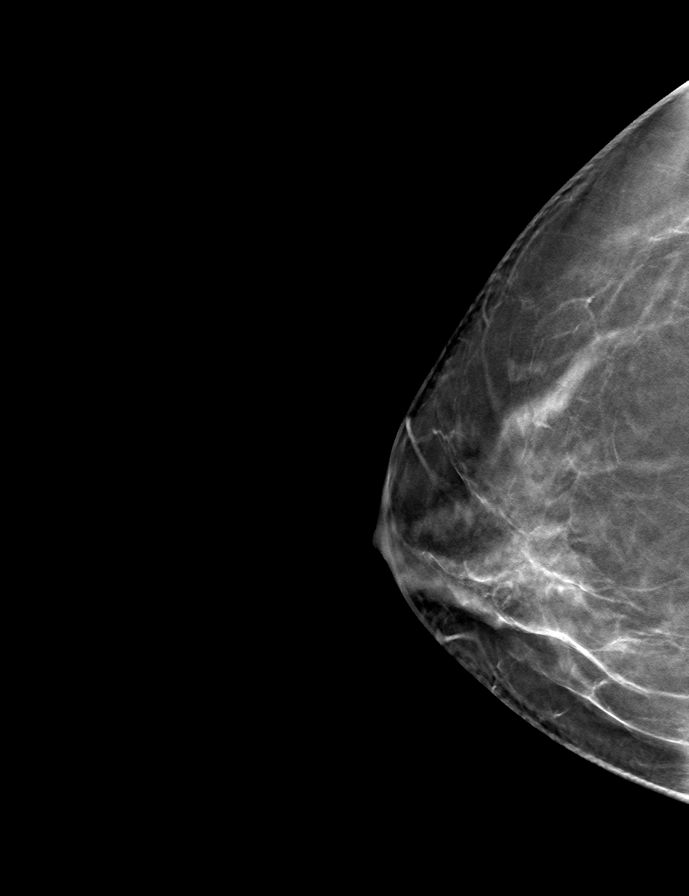

[R MLO tomo · tomo slice 33/64.0]
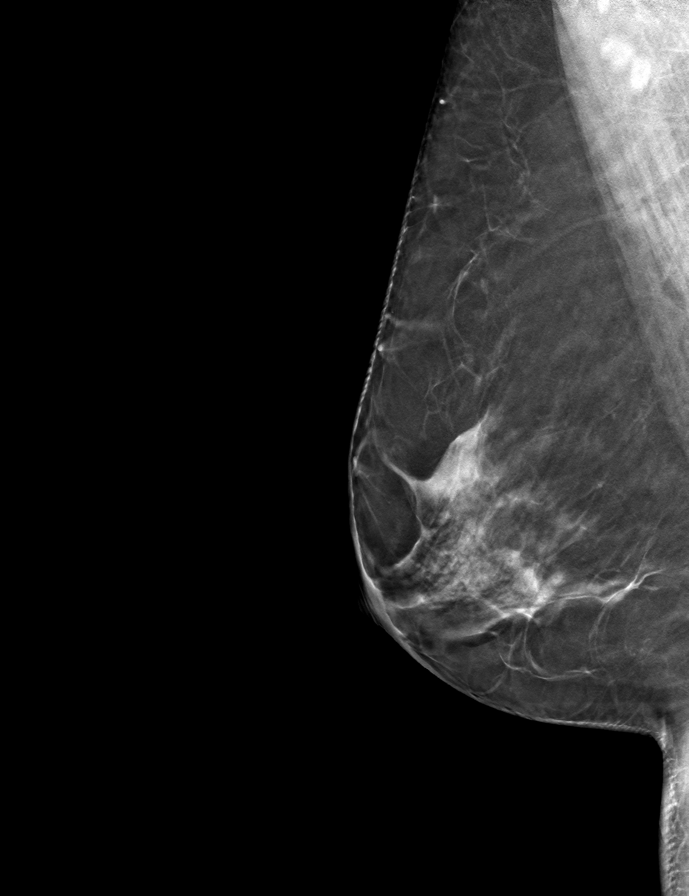

[L CC tomo · tomo slice 37/74.0]
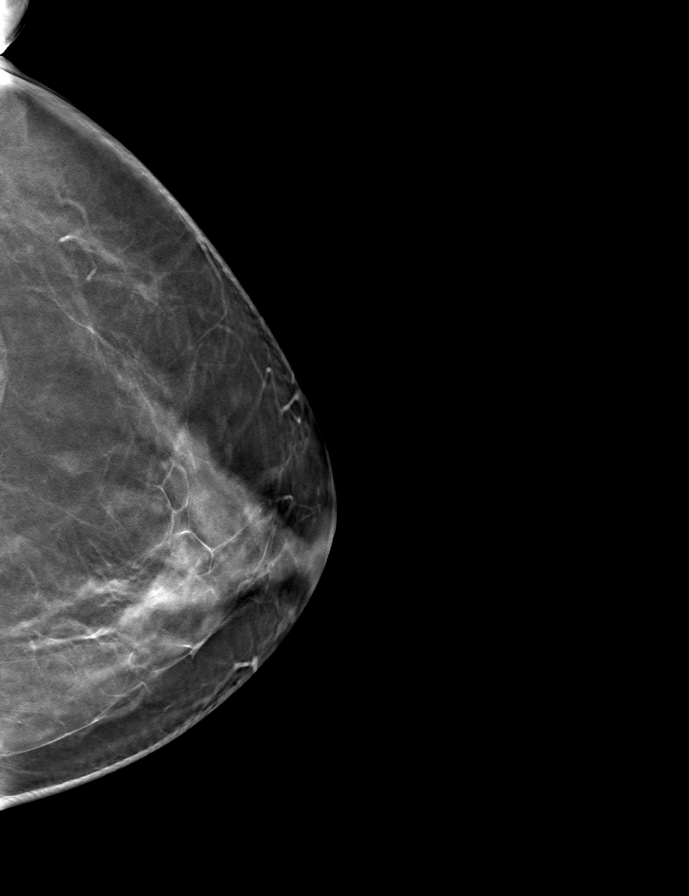

[L MLO tomo · tomo slice 35/69.0]
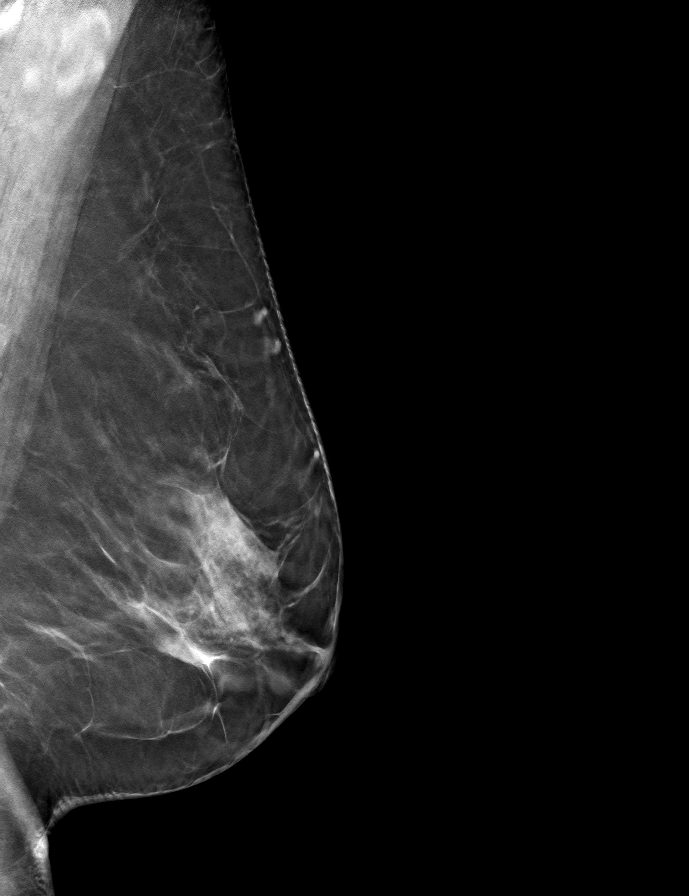

[9 of 24 positions shown; findings below may reference images not displayed]

ACR Breast Density Category c: The breast tissue is heterogeneously
dense, which may obscure small masses
FINDINGS: There are no findings suspicious for malignancy.
IMPRESSION: No mammographic evidence of malignancy. A result letter of this
screening mammogram will be mailed directly to the patient.

RECOMMENDATION:
Screening mammogram in one year. (Code:A7-O-PCM)

BI-RADS CATEGORY  1: Negative.

## 2022-12-27 ENCOUNTER — Telehealth: Payer: Self-pay | Admitting: Family Medicine

## 2022-12-27 NOTE — Telephone Encounter (Signed)
Left message for patient to call back to schedule Medicare Annual Wellness Visit.  No hx of AWV eligible as of 01/05/23  Please schedule at anytime with [facility] if patient calls the office back.    30 minute appointment.  Any questions, please call me at [phone] 

## 2022-12-29 ENCOUNTER — Encounter: Payer: Self-pay | Admitting: Family Medicine

## 2023-02-21 ENCOUNTER — Encounter: Payer: Self-pay | Admitting: Internal Medicine

## 2023-02-21 ENCOUNTER — Ambulatory Visit (INDEPENDENT_AMBULATORY_CARE_PROVIDER_SITE_OTHER): Payer: Medicare Other | Admitting: Internal Medicine

## 2023-02-21 VITALS — BP 112/82 | Temp 98.3°F | Ht <= 58 in | Wt 125.0 lb

## 2023-02-21 DIAGNOSIS — G43109 Migraine with aura, not intractable, without status migrainosus: Secondary | ICD-10-CM | POA: Diagnosis not present

## 2023-02-21 MED ORDER — METHYLPREDNISOLONE ACETATE 40 MG/ML IJ SUSP
40.0000 mg | Freq: Once | INTRAMUSCULAR | Status: AC
  Administered 2023-02-21: 40 mg via INTRAMUSCULAR

## 2023-02-21 MED ORDER — SUMATRIPTAN SUCCINATE 25 MG PO TABS: 25.0000 mg | ORAL_TABLET | ORAL | 0 refills | Status: DC | PRN

## 2023-02-21 NOTE — Progress Notes (Signed)
   Subjective:   Patient ID: Erika Rogers, female    DOB: 18-Oct-1971, 52 y.o.   MRN: [id number]  HPI The patient is a 52 YO female coming in for acute headache cluster. Started about 3-4 days ago and several headaches per day. Feel aura with vision change and then pain up left side of head. Last 30-60 minutes. Takes tylenol and excedrin and this does not help much. Previously a cluster like this she does not recall what helped it go away. MRI done at the time normal.  Review of Systems  Constitutional: Negative.   HENT: Negative.    Eyes: Negative.   Respiratory:  Negative for cough, chest tightness and shortness of breath.   Cardiovascular:  Negative for chest pain, palpitations and leg swelling.  Gastrointestinal:  Negative for abdominal distention, abdominal pain, constipation, diarrhea, nausea and vomiting.  Musculoskeletal: Negative.   Skin: Negative.   Neurological:  Positive for headaches.  Psychiatric/Behavioral: Negative.      Objective:  Physical Exam Constitutional:      Appearance: She is well-developed.  HENT:     Head: Normocephalic and atraumatic.  Cardiovascular:     Rate and Rhythm: Normal rate and regular rhythm.  Pulmonary:     Effort: Pulmonary effort is normal. No respiratory distress.     Breath sounds: Normal breath sounds. No wheezing or rales.  Abdominal:     General: Bowel sounds are normal. There is no distension.     Palpations: Abdomen is soft.     Tenderness: There is no abdominal tenderness. There is no rebound.  Musculoskeletal:     Cervical back: Normal range of motion.  Skin:    General: Skin is warm and dry.  Neurological:     Mental Status: She is alert and oriented to person, place, and time.     Coordination: Coordination normal.     Vitals:   02/21/23 0904  BP: 112/82  Temp: 98.3 F (36.8 C)  TempSrc: Oral  Weight: 125 lb (56.7 kg)  Height: 4\' 8"  (1.422 m)    Assessment & Plan:

## 2023-02-21 NOTE — Addendum Note (Signed)
Addended by: Macie Burows on: 02/21/2023 11:28 AM   Modules accepted: Orders

## 2023-02-21 NOTE — Assessment & Plan Note (Signed)
New migraine cluster no clear trigger. She has had similar previous with MRI normal during episodes. No headache today. Given depo-medrol 40 mg IM to help break the cluster. Rx sumatriptan 25 mg prn for migraines to use as needed. If no improvement return or call.

## 2023-02-21 NOTE — Patient Instructions (Signed)
We have given you the shot today to hopefully get rid of the headaches in next 3-4 days.  We have sent in sumatriptan to use if needed for headache when you feel one coming on.

## 2023-03-18 ENCOUNTER — Other Ambulatory Visit: Payer: Self-pay | Admitting: Family Medicine

## 2023-03-18 DIAGNOSIS — J302 Other seasonal allergic rhinitis: Secondary | ICD-10-CM

## 2023-04-18 ENCOUNTER — Encounter: Payer: Self-pay | Admitting: Family Medicine

## 2023-04-18 ENCOUNTER — Ambulatory Visit (INDEPENDENT_AMBULATORY_CARE_PROVIDER_SITE_OTHER): Payer: Medicare Other | Admitting: Family Medicine

## 2023-04-18 VITALS — BP 108/74 | HR 75 | Temp 97.6°F | Ht <= 58 in | Wt 123.0 lb

## 2023-04-18 DIAGNOSIS — D649 Anemia, unspecified: Secondary | ICD-10-CM | POA: Diagnosis not present

## 2023-04-18 DIAGNOSIS — R5383 Other fatigue: Secondary | ICD-10-CM | POA: Insufficient documentation

## 2023-04-18 DIAGNOSIS — E559 Vitamin D deficiency, unspecified: Secondary | ICD-10-CM | POA: Diagnosis not present

## 2023-04-18 DIAGNOSIS — J302 Other seasonal allergic rhinitis: Secondary | ICD-10-CM

## 2023-04-18 DIAGNOSIS — R7303 Prediabetes: Secondary | ICD-10-CM | POA: Diagnosis not present

## 2023-04-18 DIAGNOSIS — E782 Mixed hyperlipidemia: Secondary | ICD-10-CM

## 2023-04-18 DIAGNOSIS — F32A Depression, unspecified: Secondary | ICD-10-CM

## 2023-04-18 DIAGNOSIS — F419 Anxiety disorder, unspecified: Secondary | ICD-10-CM

## 2023-04-18 LAB — COMPREHENSIVE METABOLIC PANEL
ALT: 12 U/L (ref 0–35)
AST: 19 U/L (ref 0–37)
Albumin: 4.3 g/dL (ref 3.5–5.2)
Alkaline Phosphatase: 104 U/L (ref 39–117)
BUN: 15 mg/dL (ref 6–23)
CO2: 29 mEq/L (ref 19–32)
Calcium: 9.2 mg/dL (ref 8.4–10.5)
Chloride: 102 mEq/L (ref 96–112)
Creatinine, Ser: 0.76 mg/dL (ref 0.40–1.20)
GFR: 89.5 mL/min (ref >60.00)
Glucose, Bld: 89 mg/dL (ref 70–99)
Potassium: 4 mEq/L (ref 3.5–5.1)
Sodium: 139 mEq/L (ref 135–145)
Total Bilirubin: 0.5 mg/dL (ref 0.2–1.2)
Total Protein: 7.8 g/dL (ref 6.0–8.3)

## 2023-04-18 LAB — FOLATE: Folate: 14.7 ng/mL (ref >5.9)

## 2023-04-18 LAB — TSH: TSH: 1.94 u[IU]/mL (ref 0.35–5.50)

## 2023-04-18 LAB — LDL CHOLESTEROL, DIRECT: Direct LDL: 153 mg/dL

## 2023-04-18 LAB — CBC WITH DIFFERENTIAL/PLATELET
Basophils Absolute: 0 10*3/uL (ref 0.0–0.1)
Basophils Relative: 0.7 % (ref 0.0–3.0)
Eosinophils Absolute: 0.4 10*3/uL (ref 0.0–0.7)
Eosinophils Relative: 6.6 % — ABNORMAL HIGH (ref 0.0–5.0)
HCT: 39 % (ref 36.0–46.0)
Hemoglobin: 12.8 g/dL (ref 12.0–15.0)
Lymphocytes Relative: 42.5 % (ref 12.0–46.0)
Lymphs Abs: 2.5 10*3/uL (ref 0.7–4.0)
MCHC: 32.9 g/dL (ref 30.0–36.0)
MCV: 78.9 fl (ref 78.0–100.0)
Monocytes Absolute: 0.5 10*3/uL (ref 0.1–1.0)
Monocytes Relative: 8.2 % (ref 3.0–12.0)
Neutro Abs: 2.4 10*3/uL (ref 1.4–7.7)
Neutrophils Relative %: 42 % — ABNORMAL LOW (ref 43.0–77.0)
Platelets: 252 10*3/uL (ref 150.0–400.0)
RBC: 4.94 Mil/uL (ref 3.87–5.11)
RDW: 14.5 % (ref 11.5–15.5)
WBC: 5.8 10*3/uL (ref 4.0–10.5)

## 2023-04-18 LAB — VITAMIN D 25 HYDROXY (VIT D DEFICIENCY, FRACTURES): VITD: 16.43 ng/mL — ABNORMAL LOW (ref 30.00–100.00)

## 2023-04-18 LAB — VITAMIN B12: Vitamin B-12: 215 pg/mL (ref 211–911)

## 2023-04-18 LAB — LIPID PANEL
Cholesterol: 247 mg/dL — ABNORMAL HIGH (ref 0–200)
HDL: 66.6 mg/dL (ref >39.00)
NonHDL: 180.26
Total CHOL/HDL Ratio: 4
Triglycerides: 219 mg/dL — ABNORMAL HIGH (ref 0.0–149.0)
VLDL: 43.8 mg/dL — ABNORMAL HIGH (ref 0.0–40.0)

## 2023-04-18 LAB — T4, FREE: Free T4: 1.1 ng/dL (ref 0.60–1.60)

## 2023-04-18 MED ORDER — CETIRIZINE HCL 10 MG PO TABS: 10.0000 mg | ORAL_TABLET | Freq: Every day | ORAL | 1 refills | Status: DC

## 2023-04-18 NOTE — Progress Notes (Signed)
Subjective:     Patient ID: Erika Rogers, female    DOB: 12-Jan-1971, 52 y.o.   MRN: [id number]  Chief Complaint  Patient presents with   Medical Management of Chronic Issues    6 month f/u, fasting    HPI Patient is in today for 6 month follow up on chronic health conditions.   C/o fatigue.   HLD- has been out of statin for the past month.  She was taking Crestor 5 mg   Anemia - mild   Hx of vitamin D def- is not taking a supplement.   Anxiety -she has a therapist and sees her every 2 weeks in person. No longer seein her psychiatrist. Looking for a new one.     Health Maintenance Due  Topic Date Due   Medicare Annual Wellness (AWV)  Never done   HIV Screening  Never done   Zoster Vaccines- Shingrix (1 of 2) Never done   COVID-19 Vaccine (4 - 2023-24 season) 08/05/2022   PAP SMEAR-Modifier  01/28/2023    Past Medical History:  Diagnosis Date   Allergy    ASCUS of cervix with negative high risk HPV 09/2017   Depression    Migraine     Past Surgical History:  Procedure Laterality Date   APPENDECTOMY     ESOPHAGOGASTRODUODENOSCOPY N/A 05/03/2015   Procedure: ESOPHAGOGASTRODUODENOSCOPY (EGD);  Surgeon: Beverley Fiedler, MD;  Location: [facility] ENDOSCOPY;  Service: Endoscopy;  Laterality: N/A;   NASAL SINUS SURGERY     TONSILLECTOMY      Family History  Problem Relation Age of Onset   Diabetes Brother    Mental illness Brother     Social History   Socioeconomic History   Marital status: Married    Spouse name: Not on file   Number of children: 4   Years of education: Not on file   Highest education level: Not on file  Occupational History   Not on file  Tobacco Use   Smoking status: Never   Smokeless tobacco: Never  Vaping Use   Vaping Use: Never used  Substance and Sexual Activity   Alcohol use: No    Alcohol/week: 0.0 standard drinks of alcohol   Drug use: No   Sexual activity: Yes    Birth control/protection: None    Comment: 1st intercourse 60 yo-1  partner  Other Topics Concern   Not on file  Social History Narrative   ** Merged History Encounter **       Single. Education: Grade School. Exercise: walks 2 times a week for 2 hours.   Social Determinants of Health   Financial Resource Strain: Not on file  Food Insecurity: Not on file  Transportation Needs: Not on file  Physical Activity: Not on file  Stress: Not on file  Social Connections: Not on file  Intimate Partner Violence: Not on file    Outpatient Medications Prior to Visit  Medication Sig Dispense Refill   acetaminophen (TYLENOL) 325 MG tablet Take 650 mg by mouth every 6 (six) hours as needed.     azelastine (ASTELIN) 0.1 % nasal spray Place 1 spray into both nostrils 2 (two) times daily. Use in each nostril as directed 30 mL 12   FEROSUL 325 (65 Fe) MG tablet TAKE ONE TABLET BY MOUTH DAILY 90 tablet 3   fluticasone (FLONASE) 50 MCG/ACT nasal spray Place 2 sprays into both nostrils daily. 16 g 6   montelukast (SINGULAIR) 10 MG tablet TAKE 1 TABLET BY MOUTH AT  BEDTIME 30 tablet 3   olopatadine (PATANOL) 0.1 % ophthalmic solution Place 1 drop into both eyes daily as needed for allergies. 5 mL 12   SUMAtriptan (IMITREX) 25 MG tablet Take 1 tablet (25 mg total) by mouth every 2 (two) hours as needed for migraine. May repeat in 2 hours if headache persists or recurs. 10 tablet 0   cetirizine (ZYRTEC) 10 MG tablet Take 1 tablet (10 mg total) by mouth daily. 90 tablet 1   rosuvastatin (CRESTOR) 5 MG tablet TAKE ONE TABLET BY MOUTH DAILY (Patient not taking: Reported on 04/18/2023) 90 tablet 1   No facility-administered medications prior to visit.    Allergies  Allergen Reactions   Other Hives, Swelling and Rash    Lysol and Bleach   Beef-Derived Products Itching and Nausea And Vomiting   Shellfish Allergy Itching and Nausea And Vomiting   Amoxicillin Itching and Rash    Review of Systems  Constitutional:  Positive for malaise/fatigue. Negative for chills and fever.   Respiratory:  Negative for cough and shortness of breath.   Cardiovascular:  Negative for chest pain, palpitations and leg swelling.  Gastrointestinal:  Negative for abdominal pain, constipation, diarrhea, nausea and vomiting.  Genitourinary:  Negative for dysuria, frequency and urgency.  Neurological:  Positive for headaches. Negative for dizziness, speech change and focal weakness.  Endo/Heme/Allergies:  Positive for environmental allergies.  Psychiatric/Behavioral:  Negative for depression and suicidal ideas. The patient is nervous/anxious.        Objective:    Physical Exam Constitutional:      General: She is not in acute distress.    Appearance: She is not ill-appearing.  HENT:     Mouth/Throat:     Mouth: Mucous membranes are moist.  Eyes:     Extraocular Movements: Extraocular movements intact.     Conjunctiva/sclera: Conjunctivae normal.  Cardiovascular:     Rate and Rhythm: Normal rate and regular rhythm.     Heart sounds: Normal heart sounds.  Pulmonary:     Effort: Pulmonary effort is normal.     Breath sounds: Normal breath sounds.  Musculoskeletal:     Cervical back: Normal range of motion and neck supple.     Right lower leg: No edema.     Left lower leg: No edema.  Skin:    General: Skin is warm and dry.  Neurological:     General: No focal deficit present.     Mental Status: She is alert and oriented to person, place, and time.     Cranial Nerves: No cranial nerve deficit.     Motor: No weakness.     Coordination: Coordination normal.  Psychiatric:        Mood and Affect: Mood normal.        Behavior: Behavior normal.        Thought Content: Thought content normal.     BP 108/74 (BP Location: Left Arm, Patient Position: Sitting, Cuff Size: Large)   Pulse 75   Temp 97.6 F (36.4 C) (Temporal)   Ht 4\' 8"  (1.422 m)   Wt 123 lb (55.8 kg)   SpO2 100%   BMI 27.58 kg/m  Wt Readings from Last 3 Encounters:  04/18/23 123 lb (55.8 kg)  02/21/23 125  lb (56.7 kg)  10/18/22 126 lb (57.2 kg)       Assessment & Plan:   Problem List Items Addressed This Visit       Other   Anemia   Relevant Orders  CBC with Differential/Platelet (Completed)   Vitamin B12 (Completed)   Iron, TIBC and Ferritin Panel   Folate (Completed)   Anxiety and depression   Fatigue   Relevant Orders   CBC with Differential/Platelet (Completed)   Comprehensive metabolic panel (Completed)   Vitamin B12 (Completed)   VITAMIN D 25 Hydroxy (Vit-D Deficiency, Fractures) (Completed)   Iron, TIBC and Ferritin Panel   Folate (Completed)   TSH (Completed)   T4, free (Completed)   Mixed hyperlipidemia - Primary   Relevant Orders   Lipid panel (Completed)   Prediabetes   Relevant Orders   Comprehensive metabolic panel (Completed)   TSH (Completed)   T4, free (Completed)   Seasonal allergies   Relevant Medications   cetirizine (ZYRTEC) 10 MG tablet   Vitamin D deficiency   Relevant Orders   VITAMIN D 25 Hydroxy (Vit-D Deficiency, Fractures) (Completed)   Check lipid panel and refill statin.  Continue allergy medication, currently asymptomatic.  Check vitamin D level and replace vitamin D as appropriate.  Mild anemia -check labs and follow up  Recommend she continue seeing her therapist and schedule with psychiatrist.  Follow up pending labs.    I am having Erika Rogers maintain her acetaminophen, FeroSul, azelastine, olopatadine, rosuvastatin, fluticasone, SUMAtriptan, montelukast, and cetirizine.  Meds ordered this encounter  Medications   cetirizine (ZYRTEC) 10 MG tablet    Sig: Take 1 tablet (10 mg total) by mouth daily.    Dispense:  90 tablet    Refill:  1

## 2023-04-18 NOTE — Patient Instructions (Addendum)
Please go downstairs for labs before you leave.   We will be in touch with your results and recommendations.   Ask your counselor to help with recommendations for a new psychiatrist. Let me know if you are unable to find someone.

## 2023-04-19 ENCOUNTER — Other Ambulatory Visit: Payer: Self-pay | Admitting: Family Medicine

## 2023-04-19 DIAGNOSIS — E559 Vitamin D deficiency, unspecified: Secondary | ICD-10-CM

## 2023-04-19 DIAGNOSIS — E782 Mixed hyperlipidemia: Secondary | ICD-10-CM

## 2023-04-19 LAB — IRON,TIBC AND FERRITIN PANEL
%SAT: 21 % (calc) (ref 16–45)
Ferritin: 183 ng/mL (ref 16–232)
Iron: 60 ug/dL (ref 45–160)
TIBC: 281 mcg/dL (calc) (ref 250–450)

## 2023-04-19 MED ORDER — ROSUVASTATIN CALCIUM 5 MG PO TABS: 5.0000 mg | ORAL_TABLET | Freq: Every day | ORAL | 1 refills | Status: DC

## 2023-04-19 MED ORDER — VITAMIN D (ERGOCALCIFEROL) 1.25 MG (50000 UNIT) PO CAPS
50000.0000 [IU] | ORAL_CAPSULE | ORAL | 2 refills | Status: DC
Start: 2023-04-19 — End: 2023-08-23

## 2023-04-19 NOTE — Progress Notes (Signed)
Please see my note. Needs f/u fasting ov in 4 months.

## 2023-04-20 ENCOUNTER — Telehealth: Payer: Self-pay | Admitting: Family Medicine

## 2023-04-20 NOTE — Telephone Encounter (Signed)
Pt returned call and was able to relay results.

## 2023-04-20 NOTE — Telephone Encounter (Signed)
Patient returned Erika Rogers's call about her results. She would like a call back at [phone].

## 2023-04-20 NOTE — Telephone Encounter (Signed)
LM for pt to returncall

## 2023-06-02 ENCOUNTER — Encounter: Payer: Self-pay | Admitting: Family Medicine

## 2023-06-02 ENCOUNTER — Ambulatory Visit (INDEPENDENT_AMBULATORY_CARE_PROVIDER_SITE_OTHER): Payer: Medicare Other | Admitting: Family Medicine

## 2023-06-02 VITALS — BP 112/78 | HR 78 | Temp 97.3°F | Ht <= 58 in | Wt 124.0 lb

## 2023-06-02 DIAGNOSIS — W57XXXA Bitten or stung by nonvenomous insect and other nonvenomous arthropods, initial encounter: Secondary | ICD-10-CM | POA: Diagnosis not present

## 2023-06-02 DIAGNOSIS — R21 Rash and other nonspecific skin eruption: Secondary | ICD-10-CM | POA: Diagnosis not present

## 2023-06-02 DIAGNOSIS — S80869A Insect bite (nonvenomous), unspecified lower leg, initial encounter: Secondary | ICD-10-CM

## 2023-06-02 MED ORDER — TRIAMCINOLONE ACETONIDE 0.025 % EX OINT
1.0000 | TOPICAL_OINTMENT | Freq: Two times a day (BID) | CUTANEOUS | 0 refills | Status: DC
Start: 2023-06-02 — End: 2024-03-06

## 2023-06-02 MED ORDER — METHYLPREDNISOLONE ACETATE 40 MG/ML IJ SUSP
40.0000 mg | Freq: Once | INTRAMUSCULAR | Status: AC
Start: 2023-06-02 — End: 2023-06-02
  Administered 2023-06-02: 40 mg via INTRAMUSCULAR

## 2023-06-02 NOTE — Patient Instructions (Signed)
You can use the steroid ointment on the bites that are itching twice daily for the next week and then stop.   Use cool compresses for itching.   Follow up if you have any signs of infection.   Use DEET bug spray when working in the garden.

## 2023-06-02 NOTE — Progress Notes (Signed)
Subjective:     Patient ID: Erika Rogers, female    DOB: Aug 30, 1971, 52 y.o.   MRN: [id number]  Chief Complaint  Patient presents with   Pruritis    Bilateral leg itching, put hot salt water and it helped some. Has bites on both legs and on stomach. First noticed a week ago, also used benadryl itch stopping cream but didn't help. Night time is when it is worst    HPI  Discussed the use of AI scribe software for clinical note transcription with the patient, who gave verbal consent to proceed.  History of Present Illness          C/o of numerous mosquito bites on legs and lower abdomen after working in her garden. Using Benadryl topical.  Itching is very bothersome and keeping her awake.     Health Maintenance Due  Topic Date Due   Medicare Annual Wellness (AWV)  Never done   HIV Screening  Never done   Zoster Vaccines- Shingrix (1 of 2) Never done   PAP SMEAR-Modifier  01/28/2023    Past Medical History:  Diagnosis Date   Allergy    ASCUS of cervix with negative high risk HPV 09/2017   Depression    Migraine     Past Surgical History:  Procedure Laterality Date   APPENDECTOMY     ESOPHAGOGASTRODUODENOSCOPY N/A 05/03/2015   Procedure: ESOPHAGOGASTRODUODENOSCOPY (EGD);  Surgeon: Beverley Fiedler, MD;  Location: [facility] ENDOSCOPY;  Service: Endoscopy;  Laterality: N/A;   NASAL SINUS SURGERY     TONSILLECTOMY      Family History  Problem Relation Age of Onset   Diabetes Brother    Mental illness Brother     Social History   Socioeconomic History   Marital status: Married    Spouse name: Not on file   Number of children: 4   Years of education: Not on file   Highest education level: Not on file  Occupational History   Not on file  Tobacco Use   Smoking status: Never   Smokeless tobacco: Never  Vaping Use   Vaping Use: Never used  Substance and Sexual Activity   Alcohol use: No    Alcohol/week: 0.0 standard drinks of alcohol   Drug use: No   Sexual activity: Yes     Birth control/protection: None    Comment: 1st intercourse 28 yo-1 partner  Other Topics Concern   Not on file  Social History Narrative   ** Merged History Encounter **       Single. Education: Grade School. Exercise: walks 2 times a week for 2 hours.   Social Determinants of Health   Financial Resource Strain: Not on file  Food Insecurity: Not on file  Transportation Needs: Not on file  Physical Activity: Not on file  Stress: Not on file  Social Connections: Not on file  Intimate Partner Violence: Not on file    Outpatient Medications Prior to Visit  Medication Sig Dispense Refill   acetaminophen (TYLENOL) 325 MG tablet Take 650 mg by mouth every 6 (six) hours as needed.     azelastine (ASTELIN) 0.1 % nasal spray Place 1 spray into both nostrils 2 (two) times daily. Use in each nostril as directed 30 mL 12   cetirizine (ZYRTEC) 10 MG tablet Take 1 tablet (10 mg total) by mouth daily. 90 tablet 1   FEROSUL 325 (65 Fe) MG tablet TAKE ONE TABLET BY MOUTH DAILY 90 tablet 3   fluticasone (FLONASE) 50  MCG/ACT nasal spray Place 2 sprays into both nostrils daily. 16 g 6   montelukast (SINGULAIR) 10 MG tablet TAKE 1 TABLET BY MOUTH AT BEDTIME 30 tablet 3   olopatadine (PATANOL) 0.1 % ophthalmic solution Place 1 drop into both eyes daily as needed for allergies. 5 mL 12   rosuvastatin (CRESTOR) 5 MG tablet Take 1 tablet (5 mg total) by mouth daily. 90 tablet 1   SUMAtriptan (IMITREX) 25 MG tablet Take 1 tablet (25 mg total) by mouth every 2 (two) hours as needed for migraine. May repeat in 2 hours if headache persists or recurs. 10 tablet 0   Vitamin D, Ergocalciferol, (DRISDOL) 1.25 MG (50000 UNIT) CAPS capsule Take 1 capsule (50,000 Units total) by mouth every 7 (seven) days. 4 capsule 2   No facility-administered medications prior to visit.    Allergies  Allergen Reactions   Other Hives, Swelling and Rash    Lysol and Bleach   Beef-Derived Products Itching and Nausea And  Vomiting   Shellfish Allergy Itching and Nausea And Vomiting   Amoxicillin Itching and Rash    Review of Systems  Constitutional:  Negative for chills and fever.  Respiratory:  Negative for cough and shortness of breath.   Cardiovascular:  Negative for chest pain and palpitations.  Gastrointestinal:  Negative for nausea and vomiting.  Musculoskeletal:  Negative for joint pain and myalgias.  Neurological:  Negative for dizziness, weakness and headaches.       Objective:    Physical Exam Constitutional:      General: She is not in acute distress.    Appearance: She is not ill-appearing.  HENT:     Mouth/Throat:     Mouth: Mucous membranes are moist.     Pharynx: Oropharynx is clear.  Eyes:     Extraocular Movements: Extraocular movements intact.     Conjunctiva/sclera: Conjunctivae normal.  Cardiovascular:     Rate and Rhythm: Normal rate.  Pulmonary:     Effort: Pulmonary effort is normal.  Musculoskeletal:     Cervical back: Normal range of motion and neck supple.  Skin:    General: Skin is warm and dry.     Findings: Rash present.     Comments: Multiple insect bites on upper and lower legs as well as lower abdomen. No sign of secondary bacterial infection.   Neurological:     General: No focal deficit present.     Mental Status: She is alert and oriented to person, place, and time.  Psychiatric:        Mood and Affect: Mood normal.        Behavior: Behavior normal.        Thought Content: Thought content normal.      BP 112/78 (BP Location: Left Arm, Patient Position: Sitting, Cuff Size: Large)   Pulse 78   Temp (!) 97.3 F (36.3 C) (Temporal)   Ht 4\' 8"  (1.422 m)   Wt 124 lb (56.2 kg)   SpO2 99%   BMI 27.80 kg/m  Wt Readings from Last 3 Encounters:  06/02/23 124 lb (56.2 kg)  04/18/23 123 lb (55.8 kg)  02/21/23 125 lb (56.7 kg)       Assessment & Plan:   Problem List Items Addressed This Visit   None Visit Diagnoses     Rash    -  Primary    Relevant Medications   methylPREDNISolone acetate (DEPO-MEDROL) injection 40 mg (Completed) (Start on 06/02/2023  9:00 AM)   Insect bite  of lower leg, unspecified laterality, initial encounter       Relevant Medications   triamcinolone (KENALOG) 0.025 % ointment   methylPREDNISolone acetate (DEPO-MEDROL) injection 40 mg (Completed) (Start on 06/02/2023  9:00 AM)      Depo-Medrol 40 mg IM given.  Triamcinolone topical prescribed.  Recommend wearing long pants and using DEET when working in her garden in the future.   I am having Emmajean Mullins start on triamcinolone. I am also having her maintain her acetaminophen, FeroSul, azelastine, olopatadine, fluticasone, SUMAtriptan, montelukast, cetirizine, Vitamin D (Ergocalciferol), and rosuvastatin. We administered methylPREDNISolone acetate.  Meds ordered this encounter  Medications   triamcinolone (KENALOG) 0.025 % ointment    Sig: Apply 1 Application topically 2 (two) times daily.    Dispense:  30 g    Refill:  0    Order Specific Question:   Supervising Provider    Answer:   Hillard Danker A [[id number]]   methylPREDNISolone acetate (DEPO-MEDROL) injection 40 mg

## 2023-07-10 ENCOUNTER — Other Ambulatory Visit: Payer: Self-pay | Admitting: Family Medicine

## 2023-07-10 DIAGNOSIS — J3089 Other allergic rhinitis: Secondary | ICD-10-CM

## 2023-07-20 ENCOUNTER — Encounter (INDEPENDENT_AMBULATORY_CARE_PROVIDER_SITE_OTHER): Payer: Self-pay

## 2023-08-04 ENCOUNTER — Other Ambulatory Visit: Payer: Self-pay | Admitting: Family Medicine

## 2023-08-04 DIAGNOSIS — J302 Other seasonal allergic rhinitis: Secondary | ICD-10-CM

## 2023-08-22 NOTE — Progress Notes (Unsigned)
Subjective:     Patient ID: Erika Rogers, female    DOB: Jul 13, 1971, 52 y.o.   MRN: [id number]  No chief complaint on file.   HPI  Discussed the use of AI scribe software for clinical note transcription with the patient, who gave verbal consent to proceed.  History of Present Illness         Here to follow up on chronic health conditions.   HLD-   Vitamin D def-  Fatigue   Anxiety       Health Maintenance Due  Topic Date Due   Medicare Annual Wellness (AWV)  Never done   HIV Screening  Never done   Zoster Vaccines- Shingrix (1 of 2) Never done   INFLUENZA VACCINE  07/06/2023   COVID-19 Vaccine (4 - 2023-24 season) 08/06/2023    Past Medical History:  Diagnosis Date   Allergy    ASCUS of cervix with negative high risk HPV 09/2017   Depression    Migraine     Past Surgical History:  Procedure Laterality Date   APPENDECTOMY     ESOPHAGOGASTRODUODENOSCOPY N/A 05/03/2015   Procedure: ESOPHAGOGASTRODUODENOSCOPY (EGD);  Surgeon: Beverley Fiedler, MD;  Location[facility] ENDOSCOPY;  Service: Endoscopy;  Laterality: N/A;   NASAL SINUS SURGERY     TONSILLECTOMY      Family History  Problem Relation Age of Onset   Diabetes Brother    Mental illness Brother     Social History   Socioeconomic History   Marital status: Married    Spouse name: Not on file   Number of children: 4   Years of education: Not on file   Highest education level: Not on file  Occupational History   Not on file  Tobacco Use   Smoking status: Never   Smokeless tobacco: Never  Vaping Use   Vaping status: Never Used  Substance and Sexual Activity   Alcohol use: No    Alcohol/week: 0.0 standard drinks of alcohol   Drug use: No   Sexual activity: Yes    Birth control/protection: None    Comment: 1st intercourse 40 yo-1 partner  Other Topics Concern   Not on file  Social History Narrative   ** Merged History Encounter **       Single. Education: Grade School. Exercise: walks 2 times a  week for 2 hours.   Social Determinants of Health   Financial Resource Strain: Not on file  Food Insecurity: Not on file  Transportation Needs: Not on file  Physical Activity: Not on file  Stress: Not on file  Social Connections: Not on file  Intimate Partner Violence: Not on file    Outpatient Medications Prior to Visit  Medication Sig Dispense Refill   acetaminophen (TYLENOL) 325 MG tablet Take 650 mg by mouth every 6 (six) hours as needed.     azelastine (ASTELIN) 0.1 % nasal spray Place 1 spray into both nostrils 2 (two) times daily. Use in each nostril as directed 30 mL 12   cetirizine (ZYRTEC) 10 MG tablet Take 1 tablet (10 mg total) by mouth daily. 90 tablet 1   FEROSUL 325 (65 Fe) MG tablet TAKE ONE TABLET BY MOUTH DAILY 90 tablet 3   fluticasone (FLONASE) 50 MCG/ACT nasal spray SPRAY 2 SPRAYS INTO EACH NOSTRIL EVERY DAY 16 mL 6   montelukast (SINGULAIR) 10 MG tablet TAKE 1 TABLET BY MOUTH AT BEDTIME 90 tablet 1   olopatadine (PATANOL) 0.1 % ophthalmic solution Place 1 drop into both  eyes daily as needed for allergies. 5 mL 12   rosuvastatin (CRESTOR) 5 MG tablet Take 1 tablet (5 mg total) by mouth daily. 90 tablet 1   SUMAtriptan (IMITREX) 25 MG tablet Take 1 tablet (25 mg total) by mouth every 2 (two) hours as needed for migraine. May repeat in 2 hours if headache persists or recurs. 10 tablet 0   triamcinolone (KENALOG) 0.025 % ointment Apply 1 Application topically 2 (two) times daily. 30 g 0   Vitamin D, Ergocalciferol, (DRISDOL) 1.25 MG (50000 UNIT) CAPS capsule Take 1 capsule (50,000 Units total) by mouth every 7 (seven) days. 4 capsule 2   No facility-administered medications prior to visit.    Allergies  Allergen Reactions   Other Hives, Swelling and Rash    Lysol and Bleach   Beef-Derived Products Itching and Nausea And Vomiting   Shellfish Allergy Itching and Nausea And Vomiting   Amoxicillin Itching and Rash    ROS     Objective:    Physical  Exam   There were no vitals taken for this visit. Wt Readings from Last 3 Encounters:  06/02/23 124 lb (56.2 kg)  04/18/23 123 lb (55.8 kg)  02/21/23 125 lb (56.7 kg)       Assessment & Plan:   Problem List Items Addressed This Visit       Other   History of anemia   Mixed hyperlipidemia   Prediabetes - Primary   Vitamin D deficiency    I am having Erika Rogers maintain her acetaminophen, FeroSul, azelastine, olopatadine, SUMAtriptan, cetirizine, Vitamin D (Ergocalciferol), rosuvastatin, triamcinolone, fluticasone, and montelukast.  No orders of the defined types were placed in this encounter.

## 2023-08-23 ENCOUNTER — Encounter: Payer: Self-pay | Admitting: Family Medicine

## 2023-08-23 ENCOUNTER — Ambulatory Visit (INDEPENDENT_AMBULATORY_CARE_PROVIDER_SITE_OTHER): Payer: Medicare Other | Admitting: Family Medicine

## 2023-08-23 VITALS — BP 106/74 | HR 72 | Temp 97.6°F | Ht <= 58 in | Wt 124.0 lb

## 2023-08-23 DIAGNOSIS — E782 Mixed hyperlipidemia: Secondary | ICD-10-CM | POA: Diagnosis not present

## 2023-08-23 DIAGNOSIS — G43109 Migraine with aura, not intractable, without status migrainosus: Secondary | ICD-10-CM

## 2023-08-23 DIAGNOSIS — R7303 Prediabetes: Secondary | ICD-10-CM

## 2023-08-23 DIAGNOSIS — R6889 Other general symptoms and signs: Secondary | ICD-10-CM

## 2023-08-23 DIAGNOSIS — E559 Vitamin D deficiency, unspecified: Secondary | ICD-10-CM | POA: Diagnosis not present

## 2023-08-23 LAB — LIPID PANEL
Cholesterol: 244 mg/dL — ABNORMAL HIGH (ref 0–200)
HDL: 60.4 mg/dL (ref >39.00)
LDL Cholesterol: 138 mg/dL — ABNORMAL HIGH (ref 0–99)
NonHDL: 183.58
Total CHOL/HDL Ratio: 4
Triglycerides: 229 mg/dL — ABNORMAL HIGH (ref 0.0–149.0)
VLDL: 45.8 mg/dL — ABNORMAL HIGH (ref 0.0–40.0)

## 2023-08-23 LAB — HEMOGLOBIN A1C: Hgb A1c MFr Bld: 6 % (ref 4.6–6.5)

## 2023-08-23 LAB — VITAMIN B12: Vitamin B-12: 229 pg/mL (ref 211–911)

## 2023-08-23 LAB — VITAMIN D 25 HYDROXY (VIT D DEFICIENCY, FRACTURES): VITD: 22.9 ng/mL — ABNORMAL LOW (ref 30.00–100.00)

## 2023-08-23 NOTE — Assessment & Plan Note (Signed)
Under the care of Dr. Santiago Glad. Receiving Botox injections. Request notes.

## 2023-08-23 NOTE — Assessment & Plan Note (Signed)
Reports good compliance with Crestor for the past 4 months. Fasting. Check lipids.

## 2023-08-23 NOTE — Progress Notes (Signed)
Is she taking her rosuvastatin daily? Her LDL is much higher than previously. I recommend taking it daily. Also, her vitamin B12 and vitamin D levels are low. I recommend taking the supplements as we discussed.

## 2023-08-23 NOTE — Assessment & Plan Note (Signed)
Seeing neurologist. Not worsening. Reviewed brain MRI result from 2022. Will request records from Dr. Neale Burly who is treating her headaches.

## 2023-08-23 NOTE — Patient Instructions (Signed)
Please go downstairs for labs.

## 2023-08-23 NOTE — Assessment & Plan Note (Signed)
Work on diet and exercise. Check A1c and follow up

## 2023-08-23 NOTE — Assessment & Plan Note (Signed)
Check vitamin D level and replace as warranted

## 2023-10-17 ENCOUNTER — Ambulatory Visit (INDEPENDENT_AMBULATORY_CARE_PROVIDER_SITE_OTHER): Payer: Medicare Other | Admitting: Family Medicine

## 2023-10-17 ENCOUNTER — Encounter: Payer: Self-pay | Admitting: Family Medicine

## 2023-10-17 VITALS — BP 102/66 | HR 73 | Temp 97.6°F | Ht <= 58 in | Wt 122.0 lb

## 2023-10-17 DIAGNOSIS — E559 Vitamin D deficiency, unspecified: Secondary | ICD-10-CM | POA: Diagnosis not present

## 2023-10-17 DIAGNOSIS — R7303 Prediabetes: Secondary | ICD-10-CM

## 2023-10-17 DIAGNOSIS — Z23 Encounter for immunization: Secondary | ICD-10-CM | POA: Diagnosis not present

## 2023-10-17 DIAGNOSIS — E782 Mixed hyperlipidemia: Secondary | ICD-10-CM | POA: Diagnosis not present

## 2023-10-17 LAB — LIPID PANEL
Cholesterol: 187 mg/dL (ref 0–200)
HDL: 60 mg/dL (ref >39.00)
LDL Cholesterol: 84 mg/dL (ref 0–99)
NonHDL: 126.66
Total CHOL/HDL Ratio: 3
Triglycerides: 211 mg/dL — ABNORMAL HIGH (ref 0.0–149.0)
VLDL: 42.2 mg/dL — ABNORMAL HIGH (ref 0.0–40.0)

## 2023-10-17 LAB — BASIC METABOLIC PANEL
BUN: 10 mg/dL (ref 6–23)
CO2: 26 meq/L (ref 19–32)
Calcium: 9.7 mg/dL (ref 8.4–10.5)
Chloride: 106 meq/L (ref 96–112)
Creatinine, Ser: 0.79 mg/dL (ref 0.40–1.20)
GFR: 85.14 mL/min (ref >60.00)
Glucose, Bld: 101 mg/dL — ABNORMAL HIGH (ref 70–99)
Potassium: 4 meq/L (ref 3.5–5.1)
Sodium: 141 meq/L (ref 135–145)

## 2023-10-17 LAB — VITAMIN D 25 HYDROXY (VIT D DEFICIENCY, FRACTURES): VITD: 27.04 ng/mL — ABNORMAL LOW (ref 30.00–100.00)

## 2023-10-17 NOTE — Progress Notes (Signed)
Subjective:     Patient ID: Erika Rogers, female    DOB: [birth year], 52 y.o.   MRN: [id number]  Chief Complaint  Patient presents with   Annual Exam    fasting    HPI   History of Present Illness          She is here for a fasting follow up of chronic health conditions.   Prediabetes - she has cut back on sugar and tea. Feels better, more energy and fewer headaches.  She sees a headache specialist.   HLD- reports taking Crestor 5 mg daily.   Vitamin D def- taking OTC supplement.   She is volunteering at her church.    Health Maintenance Due  Topic Date Due   Medicare Annual Wellness (AWV)  Never done   HIV Screening  Never done    Past Medical History:  Diagnosis Date   Allergy    ASCUS of cervix with negative high risk HPV 09/2017   Depression    Migraine     Past Surgical History:  Procedure Laterality Date   APPENDECTOMY     ESOPHAGOGASTRODUODENOSCOPY N/A 05/03/2015   Procedure: ESOPHAGOGASTRODUODENOSCOPY (EGD);  Surgeon: Beverley Fiedler, MD;  Location: [facility] ENDOSCOPY;  Service: Endoscopy;  Laterality: N/A;   NASAL SINUS SURGERY     TONSILLECTOMY      Family History  Problem Relation Age of Onset   Diabetes Brother    Mental illness Brother     Social History   Socioeconomic History   Marital status: Married    Spouse name: Not on file   Number of children: 4   Years of education: Not on file   Highest education level: Not on file  Occupational History   Not on file  Tobacco Use   Smoking status: Never   Smokeless tobacco: Never  Vaping Use   Vaping status: Never Used  Substance and Sexual Activity   Alcohol use: No    Alcohol/week: 0.0 standard drinks of alcohol   Drug use: No   Sexual activity: Yes    Birth control/protection: None    Comment: 1st intercourse 18 yo-1 partner  Other Topics Concern   Not on file  Social History Narrative   ** Merged History Encounter **       Single. Education: Grade School. Exercise: walks 2 times a  week for 2 hours.   Social Determinants of Health   Financial Resource Strain: Not on file  Food Insecurity: Not on file  Transportation Needs: Not on file  Physical Activity: Not on file  Stress: Not on file  Social Connections: Not on file  Intimate Partner Violence: Not on file    Outpatient Medications Prior to Visit  Medication Sig Dispense Refill   acetaminophen (TYLENOL) 325 MG tablet Take 650 mg by mouth every 6 (six) hours as needed.     azelastine (ASTELIN) 0.1 % nasal spray Place 1 spray into both nostrils 2 (two) times daily. Use in each nostril as directed 30 mL 12   cetirizine (ZYRTEC) 10 MG tablet Take 1 tablet (10 mg total) by mouth daily. 90 tablet 1   FEROSUL 325 (65 Fe) MG tablet TAKE ONE TABLET BY MOUTH DAILY 90 tablet 3   fluticasone (FLONASE) 50 MCG/ACT nasal spray SPRAY 2 SPRAYS INTO EACH NOSTRIL EVERY DAY 16 mL 6   montelukast (SINGULAIR) 10 MG tablet TAKE 1 TABLET BY MOUTH AT BEDTIME 90 tablet 1   olopatadine (PATANOL) 0.1 % ophthalmic solution Place  1 drop into both eyes daily as needed for allergies. 5 mL 12   rosuvastatin (CRESTOR) 5 MG tablet Take 1 tablet (5 mg total) by mouth daily. 90 tablet 1   triamcinolone (KENALOG) 0.025 % ointment Apply 1 Application topically 2 (two) times daily. 30 g 0   zonisamide (ZONEGRAN) 25 MG capsule Take 25 mg by mouth daily.     SUMAtriptan (IMITREX) 25 MG tablet Take 1 tablet (25 mg total) by mouth every 2 (two) hours as needed for migraine. May repeat in 2 hours if headache persists or recurs. (Patient not taking: Reported on 10/17/2023) 10 tablet 0   No facility-administered medications prior to visit.    Allergies  Allergen Reactions   Other Hives, Swelling and Rash    Lysol and Bleach   Beef-Derived Products Itching and Nausea And Vomiting   Shellfish Allergy Itching and Nausea And Vomiting   Amoxicillin Itching and Rash    Review of Systems  Constitutional:  Negative for chills, fever, malaise/fatigue and  weight loss.  HENT:  Negative for congestion, ear pain, sinus pain and sore throat.   Eyes:  Negative for blurred vision, double vision and pain.  Respiratory:  Negative for cough, shortness of breath and wheezing.   Cardiovascular:  Negative for chest pain, palpitations and leg swelling.  Gastrointestinal:  Negative for abdominal pain, constipation, diarrhea, nausea and vomiting.  Genitourinary:  Negative for dysuria, frequency and urgency.  Musculoskeletal:  Negative for back pain, joint pain and myalgias.  Skin:  Negative for rash.  Neurological:  Negative for dizziness, tingling, focal weakness and headaches.  Psychiatric/Behavioral:  Negative for depression. The patient is not nervous/anxious.        Objective:    Physical Exam Constitutional:      General: She is not in acute distress.    Appearance: She is not ill-appearing.  HENT:     Right Ear: Tympanic membrane, ear canal and external ear normal.     Left Ear: Tympanic membrane, ear canal and external ear normal.     Nose: Nose normal.     Mouth/Throat:     Mouth: Mucous membranes are moist.     Pharynx: Oropharynx is clear.  Eyes:     Extraocular Movements: Extraocular movements intact.     Conjunctiva/sclera: Conjunctivae normal.     Pupils: Pupils are equal, round, and reactive to light.  Neck:     Thyroid: No thyroid mass, thyromegaly or thyroid tenderness.  Cardiovascular:     Rate and Rhythm: Normal rate and regular rhythm.     Pulses: Normal pulses.     Heart sounds: Normal heart sounds.  Pulmonary:     Effort: Pulmonary effort is normal.     Breath sounds: Normal breath sounds.  Abdominal:     General: Bowel sounds are normal.     Palpations: Abdomen is soft.     Tenderness: There is no abdominal tenderness. There is no right CVA tenderness, left CVA tenderness, guarding or rebound.  Musculoskeletal:        General: Normal range of motion.     Cervical back: Normal range of motion and neck supple. No  tenderness.     Right lower leg: No edema.     Left lower leg: No edema.  Lymphadenopathy:     Cervical: No cervical adenopathy.  Skin:    General: Skin is warm and dry.     Findings: No lesion or rash.  Neurological:     General: No  focal deficit present.     Mental Status: She is alert and oriented to person, place, and time.     Cranial Nerves: No cranial nerve deficit.     Sensory: No sensory deficit.     Motor: No weakness.     Gait: Gait normal.  Psychiatric:        Mood and Affect: Mood normal.        Behavior: Behavior normal.        Thought Content: Thought content normal.      BP 102/66 (BP Location: Left Arm, Patient Position: Sitting, Cuff Size: Normal)   Pulse 73   Temp 97.6 F (36.4 C) (Temporal)   Ht 4\' 8"  (1.422 m)   Wt 122 lb (55.3 kg)   SpO2 100%   BMI 27.35 kg/m  Wt Readings from Last 3 Encounters:  10/17/23 122 lb (55.3 kg)  08/23/23 124 lb (56.2 kg)  06/02/23 124 lb (56.2 kg)       Assessment & Plan:   Problem List Items Addressed This Visit       Other   Mixed hyperlipidemia - Primary   Relevant Orders   Lipid panel   Prediabetes   Relevant Orders   Basic metabolic panel   Vitamin D deficiency   Relevant Orders   VITAMIN D 25 Hydroxy (Vit-D Deficiency, Fractures)   Other Visit Diagnoses     Need for influenza vaccination       Relevant Orders   Flu vaccine trivalent PF, 6mos and older(Flulaval,Afluria,Fluarix,Fluzone) (Completed)      Counseling all preventive health care.  She is up-to-date on colon cancer screening.  She will call and schedule with her OB/GYN. Reviewed immunizations.  Flu vaccine given. Discussed health promotion. She will follow-up in office with a nurse for her Medicare wellness visit.   I have discontinued Susi Lavalais's SUMAtriptan. I am also having her maintain her acetaminophen, FeroSul, azelastine, olopatadine, cetirizine, rosuvastatin, triamcinolone, fluticasone, montelukast, and zonisamide.  No  orders of the defined types were placed in this encounter.

## 2023-10-17 NOTE — Assessment & Plan Note (Signed)
Continue over-the-counter supplement unless vitamin D has not improved.  Check vitamin D level and follow-up

## 2023-10-17 NOTE — Assessment & Plan Note (Signed)
Continue working on a healthy diet and exercise.

## 2023-10-17 NOTE — Assessment & Plan Note (Signed)
She is fasting today.  Check lipids and adjust Crestor dose if warranted

## 2023-10-17 NOTE — Patient Instructions (Addendum)
  Please go downstairs for labs before you leave.   Please call and schedule a visit with [facility] for breast and pelvic exams.  [phone]

## 2023-11-17 ENCOUNTER — Ambulatory Visit (INDEPENDENT_AMBULATORY_CARE_PROVIDER_SITE_OTHER): Payer: Medicare Other

## 2023-11-17 DIAGNOSIS — Z0000 Encounter for general adult medical examination without abnormal findings: Secondary | ICD-10-CM

## 2023-11-17 NOTE — Patient Instructions (Signed)
Ms. Borra , Thank you for taking time to come for your Medicare Wellness Visit. I appreciate your ongoing commitment to your health goals. Please review the following plan we discussed and let me know if I can assist you in the future.   Referrals/Orders/Follow-Ups/Clinician Recommendations: none  This is a list of the screening recommended for you and due dates:  Health Maintenance  Topic Date Due   HIV Screening  Never done   COVID-19 Vaccine (4 - 2024-25 season) 08/06/2023   Zoster (Shingles) Vaccine (1 of 2) 11/22/2023*   Mammogram  04/19/2024   Medicare Annual Wellness Visit  11/16/2024   Pap with HPV screening  01/28/2025   Cologuard (Stool DNA test)  05/28/2025   DTaP/Tdap/Td vaccine (4 - Td or Tdap) 03/01/2026   Flu Shot  Completed   Hepatitis C Screening  Completed   HPV Vaccine  Aged Out  *Topic was postponed. The date shown is not the original due date.    Advanced directives: (ACP Link)Information on Advanced Care Planning can be found at [facility] Advance Health Care Directives Advance Health Care Directives ([email or web address]   Next Medicare Annual Wellness Visit scheduled for next year: Yes  insert Preventive Care Attachment Reference

## 2023-11-17 NOTE — Progress Notes (Signed)
Subjective:   Erika Rogers is a 52 y.o. female who presents for Medicare Annual (Subsequent) preventive examination.  Visit Complete: Virtual I connected with  Erika Rogers on 11/17/23 by a audio enabled telemedicine application and verified that I am speaking with the correct person using two identifiers.  Patient Location: Home  Provider Location: Office/Clinic  I discussed the limitations of evaluation and management by telemedicine. The patient expressed understanding and agreed to proceed.  Vital Signs: Because this visit was a virtual/telehealth visit, some criteria may be missing or patient reported. Any vitals not documented were not able to be obtained and vitals that have been documented are patient reported.    Cardiac Risk Factors include: dyslipidemia     Objective:    Today's Vitals   There is no height or weight on file to calculate BMI.     11/17/2023    9:42 AM 05/02/2015   10:57 PM  Advanced Directives  Does Patient Have a Medical Advance Directive? No No    Current Medications (verified) Outpatient Encounter Medications as of 11/17/2023  Medication Sig   azelastine (ASTELIN) 0.1 % nasal spray Place 1 spray into both nostrils 2 (two) times daily. Use in each nostril as directed   cetirizine (ZYRTEC) 10 MG tablet Take 1 tablet (10 mg total) by mouth daily.   FEROSUL 325 (65 Fe) MG tablet TAKE ONE TABLET BY MOUTH DAILY   fluticasone (FLONASE) 50 MCG/ACT nasal spray SPRAY 2 SPRAYS INTO EACH NOSTRIL EVERY DAY   montelukast (SINGULAIR) 10 MG tablet TAKE 1 TABLET BY MOUTH AT BEDTIME   olopatadine (PATANOL) 0.1 % ophthalmic solution Place 1 drop into both eyes daily as needed for allergies.   rosuvastatin (CRESTOR) 5 MG tablet Take 1 tablet (5 mg total) by mouth daily.   acetaminophen (TYLENOL) 325 MG tablet Take 650 mg by mouth every 6 (six) hours as needed. (Patient not taking: Reported on 11/17/2023)   triamcinolone (KENALOG) 0.025 % ointment Apply 1 Application  topically 2 (two) times daily. (Patient not taking: Reported on 11/17/2023)   zonisamide (ZONEGRAN) 25 MG capsule Take 25 mg by mouth daily. (Patient not taking: Reported on 11/17/2023)   No facility-administered encounter medications on file as of 11/17/2023.    Allergies (verified) Other, Beef-derived drug products, Shellfish allergy, and Amoxicillin   History: Past Medical History:  Diagnosis Date   Allergy    ASCUS of cervix with negative high risk HPV 09/2017   Depression    Migraine    Past Surgical History:  Procedure Laterality Date   APPENDECTOMY     ESOPHAGOGASTRODUODENOSCOPY N/A 05/03/2015   Procedure: ESOPHAGOGASTRODUODENOSCOPY (EGD);  Surgeon: Beverley Fiedler, MD;  Location: [facility] ENDOSCOPY;  Service: Endoscopy;  Laterality: N/A;   NASAL SINUS SURGERY     TONSILLECTOMY     Family History  Problem Relation Age of Onset   Diabetes Brother    Mental illness Brother    Social History   Socioeconomic History   Marital status: Married    Spouse name: Not on file   Number of children: 4   Years of education: Not on file   Highest education level: Not on file  Occupational History   Not on file  Tobacco Use   Smoking status: Never   Smokeless tobacco: Never  Vaping Use   Vaping status: Never Used  Substance and Sexual Activity   Alcohol use: No    Alcohol/week: 0.0 standard drinks of alcohol   Drug use: No  Sexual activity: Yes    Birth control/protection: None    Comment: 1st intercourse 76 yo-1 partner  Other Topics Concern   Not on file  Social History Narrative   ** Merged History Encounter **       Single. Education: Grade School. Exercise: walks 2 times a week for 2 hours.   Social Drivers of Corporate investment banker Strain: Low Risk  (11/17/2023)   Overall Financial Resource Strain (CARDIA)    Difficulty of Paying Living Expenses: Not hard at all  Food Insecurity: No Food Insecurity (11/17/2023)   Hunger Vital Sign    Worried About Running  Out of Food in the Last Year: Never true    Ran Out of Food in the Last Year: Never true  Transportation Needs: No Transportation Needs (11/17/2023)   PRAPARE - Administrator, Civil Service (Medical): No    Lack of Transportation (Non-Medical): No  Physical Activity: Insufficiently Active (11/17/2023)   Exercise Vital Sign    Days of Exercise per Week: 4 days    Minutes of Exercise per Session: 20 min  Stress: Stress Concern Present (11/17/2023)   [employer] of Occupational Health - Occupational Stress Questionnaire    Feeling of Stress : Very much  Social Connections: Moderately Integrated (11/17/2023)   Social Connection and Isolation Panel [NHANES]    Frequency of Communication with Friends and Family: Three times a week    Frequency of Social Gatherings with Friends and Family: Twice a week    Attends Religious Services: More than 4 times per year    Active Member of [employer] or Organizations: No    Attends Engineer, structural: Never    Marital Status: Married    Tobacco Counseling Counseling given: Not Answered   Clinical Intake:  Pre-visit preparation completed: Yes  Pain : No/denies pain     Nutritional Risks: Nausea/ vomitting/ diarrhea (nausea three days ago, resolved) Diabetes: No  How often do you need to have someone help you when you read instructions, pamphlets, or other written materials from your doctor or pharmacy?: 1 - Never  Interpreter Needed?: No  Information entered by :: NAllen LPN   Activities of Daily Living    11/17/2023    9:28 AM  In your present state of health, do you have any difficulty performing the following activities:  Hearing? 0  Vision? 0  Difficulty concentrating or making decisions? 1  Comment says trouble remembering  Walking or climbing stairs? 0  Dressing or bathing? 0  Doing errands, shopping? 0  Preparing Food and eating ? N  Using the Toilet? N  In the past six months, have you  accidently leaked urine? N  Do you have problems with loss of bowel control? N  Managing your Medications? N  Managing your Finances? N  Housekeeping or managing your Housekeeping? N    Patient Care Team: Avanell Shackleton, NP-C as PCP - General (Family Medicine) Santiago Glad, MD as Referring Physician (Specialist)  Indicate any recent Medical Services you may have received from other than [facility] providers in the past year (date may be approximate).     Assessment:   This is a routine wellness examination for Erika Rogers.  Hearing/Vision screen Hearing Screening - Comments:: Denies hearing issues Vision Screening - Comments:: No regular eye exams   Goals Addressed             This Visit's Progress    Patient Stated  11/17/2023, wants to get anxiety under control       Depression Screen    11/17/2023    9:44 AM 08/23/2023    8:43 AM 02/21/2023    9:09 AM 09/16/2022    9:43 AM 03/15/2022    9:15 AM 09/14/2021    9:03 AM 04/05/2021   10:36 AM  PHQ 2/9 Scores  PHQ - 2 Score 6 0 0 5 0 2 0  PHQ- 9 Score 15  0 16 0 19     Fall Risk    11/17/2023    9:43 AM 08/23/2023    8:43 AM 02/21/2023    9:09 AM 10/18/2022    9:55 AM 09/16/2022   10:02 AM  Fall Risk   Falls in the past year? 0 0 0 0 0  Number falls in past yr: 0 0 0 0 0  Injury with Fall? 0 0 0 0 0  Risk for fall due to : Medication side effect No Fall Risks  No Fall Risks No Fall Risks  Follow up Falls prevention discussed;Falls evaluation completed Falls evaluation completed Falls evaluation completed Falls evaluation completed Falls evaluation completed    MEDICARE RISK AT HOME: Medicare Risk at Home Any stairs in or around the home?: Yes If so, are there any without handrails?: Yes Home free of loose throw rugs in walkways, pet beds, electrical cords, etc?: Yes Adequate lighting in your home to reduce risk of falls?: Yes Life alert?: No Use of a cane, walker or w/c?: No Grab bars in the bathroom?:  No Shower chair or bench in shower?: No Elevated toilet seat or a handicapped toilet?: No  TIMED UP AND GO:  Was the test performed?  No    Cognitive Function:        11/17/2023    9:45 AM  6CIT Screen  What Year? 0 points  What month? 0 points  What time? 3 points  Count back from 20 0 points  Months in reverse 4 points  Repeat phrase 6 points  Total Score 13 points    Immunizations Immunization History  Administered Date(s) Administered   Influenza, Seasonal, Injecte, Preservative Fre 10/17/2023   Influenza,inj,Quad PF,6+ Mos 09/15/2020, 09/14/2021, 09/16/2022   Moderna Sars-Covid-2 Vaccination 02/21/2020, 03/15/2020, 05/15/2021   Td 08/04/2015   Tdap 07/03/2015, 03/01/2016    TDAP status: Up to date  Flu Vaccine status: Up to date  Pneumococcal vaccine status: Up to date  Covid-19 vaccine status: Information provided on how to obtain vaccines.   Qualifies for Shingles Vaccine? Yes   Zostavax completed No   Shingrix Completed?: No.    Education has been provided regarding the importance of this vaccine. Patient has been advised to call insurance company to determine out of pocket expense if they have not yet received this vaccine. Advised may also receive vaccine at local pharmacy or Health Dept. Verbalized acceptance and understanding.  Screening Tests Health Maintenance  Topic Date Due   HIV Screening  Never done   COVID-19 Vaccine (4 - 2024-25 season) 08/06/2023   Zoster Vaccines- Shingrix (1 of 2) 11/22/2023 (Originally 12/05/1989)   MAMMOGRAM  04/19/2024   Medicare Annual Wellness (AWV)  11/16/2024   Cervical Cancer Screening (HPV/Pap Cotest)  01/28/2025   Fecal DNA (Cologuard)  05/28/2025   DTaP/Tdap/Td (4 - Td or Tdap) 03/01/2026   INFLUENZA VACCINE  Completed   Hepatitis C Screening  Completed   HPV VACCINES  Aged Out    Health Maintenance  Health Maintenance Due  Topic Date Due   HIV Screening  Never done   COVID-19 Vaccine (4 - 2024-25  season) 08/06/2023    Colorectal cancer screening: Type of screening: Cologuard. Completed 05/28/2022. Repeat every 3 years  Mammogram status: Completed 04/19/2022. Repeat every year  Bone Density status: n/a  Lung Cancer Screening: (Low Dose CT Chest recommended if Age 61-80 years, 20 pack-year currently smoking OR have quit w/in 15years.) does not qualify.   Lung Cancer Screening Referral: no  Additional Screening:  Hepatitis C Screening: does qualify; Completed 03/15/2022  Vision Screening: Recommended annual ophthalmology exams for early detection of glaucoma and other disorders of the eye. Is the patient up to date with their annual eye exam?  No  Who is the provider or what is the name of the office in which the patient attends annual eye exams? none If pt is not established with a provider, would they like to be referred to a provider to establish care? No .   Dental Screening: Recommended annual dental exams for proper oral hygiene  Diabetic Foot Exam: n/a  Community Resource Referral / Chronic Care Management: CRR required this visit?  No   CCM required this visit?  No     Plan:     I have personally reviewed and noted the following in the patient's chart:   Medical and social history Use of alcohol, tobacco or illicit drugs  Current medications and supplements including opioid prescriptions. Patient is not currently taking opioid prescriptions. Functional ability and status Nutritional status Physical activity Advanced directives List of other physicians Hospitalizations, surgeries, and ER visits in previous 12 months Vitals Screenings to include cognitive, depression, and falls Referrals and appointments  In addition, I have reviewed and discussed with patient certain preventive protocols, quality metrics, and best practice recommendations. A written personalized care plan for preventive services as well as general preventive health recommendations were  provided to patient.     Barb Merino, LPN   [id number]   After Visit Summary: (Pick Up) Due to this being a telephonic visit, with patients personalized plan was offered to patient and patient has requested to Pick up at office.  Nurse Notes: none

## 2023-11-20 ENCOUNTER — Ambulatory Visit: Payer: Medicare Other | Admitting: Family Medicine

## 2023-11-20 ENCOUNTER — Encounter: Payer: Self-pay | Admitting: Family Medicine

## 2023-11-20 VITALS — BP 116/78 | HR 74 | Temp 97.6°F | Ht <= 58 in | Wt 124.0 lb

## 2023-11-20 DIAGNOSIS — Q283 Other malformations of cerebral vessels: Secondary | ICD-10-CM | POA: Diagnosis not present

## 2023-11-20 DIAGNOSIS — R413 Other amnesia: Secondary | ICD-10-CM | POA: Diagnosis not present

## 2023-11-20 DIAGNOSIS — F32A Depression, unspecified: Secondary | ICD-10-CM | POA: Diagnosis not present

## 2023-11-20 DIAGNOSIS — F419 Anxiety disorder, unspecified: Secondary | ICD-10-CM

## 2023-11-20 DIAGNOSIS — R29818 Other symptoms and signs involving the nervous system: Secondary | ICD-10-CM

## 2023-11-20 MED ORDER — BUSPIRONE HCL 5 MG PO TABS: 5.0000 mg | ORAL_TABLET | Freq: Two times a day (BID) | ORAL | 0 refills | Status: DC

## 2023-11-20 NOTE — Progress Notes (Signed)
Subjective:     Patient ID: Erika Rogers, female    DOB: Nov 20, 1971, 52 y.o.   MRN: [id number]  Chief Complaint  Patient presents with   Anxiety    Wants to discuss starting on anxiety mdeication, states she is feeling helpless and gets "upset too much"    Anxiety Symptoms include nervous/anxious behavior. Patient reports no chest pain, dizziness, nausea, palpitations, shortness of breath or suicidal ideas.       History of Present Illness         Here with complaints of worsening anxiety and depression for the past 4 days. States her mood is worse because of her health issues. States memory seems worse and she has trouble finding her words at times. Ongoing issue. She wants to be able to work.  Denies SI  Requests medication to help with anxiety.   She was last seen at her neurosurgeons office in 2022. Unclear as to why she did not follow up.  Hx of abnormal MRI showing cerebral cavernoma.   Neurosurgeon: Hoyt Koch MD [facility] -Cerebral cavernoma and transient neurologic deficit episodes    Health Maintenance Due  Topic Date Due   HIV Screening  Never done    Past Medical History:  Diagnosis Date   Allergy    ASCUS of cervix with negative high risk HPV 09/2017   Depression    Migraine     Past Surgical History:  Procedure Laterality Date   APPENDECTOMY     ESOPHAGOGASTRODUODENOSCOPY N/A 05/03/2015   Procedure: ESOPHAGOGASTRODUODENOSCOPY (EGD);  Surgeon: Beverley Fiedler, MD;  Location: [facility] ENDOSCOPY;  Service: Endoscopy;  Laterality: N/A;   NASAL SINUS SURGERY     TONSILLECTOMY      Family History  Problem Relation Age of Onset   Diabetes Brother    Mental illness Brother     Social History   Socioeconomic History   Marital status: Married    Spouse name: Not on file   Number of children: 4   Years of education: Not on file   Highest education level: Not on file  Occupational History   Not on file  Tobacco Use   Smoking status: Never    Smokeless tobacco: Never  Vaping Use   Vaping status: Never Used  Substance and Sexual Activity   Alcohol use: No    Alcohol/week: 0.0 standard drinks of alcohol   Drug use: No   Sexual activity: Yes    Birth control/protection: None    Comment: 1st intercourse 36 yo-1 partner  Other Topics Concern   Not on file  Social History Narrative   ** Merged History Encounter **       Single. Education: Grade School. Exercise: walks 2 times a week for 2 hours.   Social Drivers of Corporate investment banker Strain: Low Risk  (11/17/2023)   Overall Financial Resource Strain (CARDIA)    Difficulty of Paying Living Expenses: Not hard at all  Food Insecurity: No Food Insecurity (11/17/2023)   Hunger Vital Sign    Worried About Running Out of Food in the Last Year: Never true    Ran Out of Food in the Last Year: Never true  Transportation Needs: No Transportation Needs (11/17/2023)   PRAPARE - Administrator, Civil Service (Medical): No    Lack of Transportation (Non-Medical): No  Physical Activity: Insufficiently Active (11/17/2023)   Exercise Vital Sign    Days of Exercise per Week: 4 days    Minutes  of Exercise per Session: 20 min  Stress: Stress Concern Present (11/17/2023)   [employer] of Occupational Health - Occupational Stress Questionnaire    Feeling of Stress : Very much  Social Connections: Moderately Integrated (11/17/2023)   Social Connection and Isolation Panel [NHANES]    Frequency of Communication with Friends and Family: Three times a week    Frequency of Social Gatherings with Friends and Family: Twice a week    Attends Religious Services: More than 4 times per year    Active Member of [employer] or Organizations: No    Attends Banker Meetings: Never    Marital Status: Married  Catering manager Violence: Not At Risk (11/17/2023)   Humiliation, Afraid, Rape, and Kick questionnaire    Fear of Current or Ex-Partner: No    Emotionally  Abused: No    Physically Abused: No    Sexually Abused: No    Outpatient Medications Prior to Visit  Medication Sig Dispense Refill   acetaminophen (TYLENOL) 325 MG tablet Take 650 mg by mouth every 6 (six) hours as needed.     azelastine (ASTELIN) 0.1 % nasal spray Place 1 spray into both nostrils 2 (two) times daily. Use in each nostril as directed 30 mL 12   cetirizine (ZYRTEC) 10 MG tablet Take 1 tablet (10 mg total) by mouth daily. 90 tablet 1   FEROSUL 325 (65 Fe) MG tablet TAKE ONE TABLET BY MOUTH DAILY 90 tablet 3   fluticasone (FLONASE) 50 MCG/ACT nasal spray SPRAY 2 SPRAYS INTO EACH NOSTRIL EVERY DAY 16 mL 6   montelukast (SINGULAIR) 10 MG tablet TAKE 1 TABLET BY MOUTH AT BEDTIME 90 tablet 1   olopatadine (PATANOL) 0.1 % ophthalmic solution Place 1 drop into both eyes daily as needed for allergies. 5 mL 12   rosuvastatin (CRESTOR) 5 MG tablet Take 1 tablet (5 mg total) by mouth daily. 90 tablet 1   triamcinolone (KENALOG) 0.025 % ointment Apply 1 Application topically 2 (two) times daily. 30 g 0   zonisamide (ZONEGRAN) 25 MG capsule Take 25 mg by mouth daily.     No facility-administered medications prior to visit.    Allergies  Allergen Reactions   Other Hives, Swelling and Rash    Lysol and Bleach   Beef-Derived Drug Products Itching and Nausea And Vomiting   Shellfish Allergy Itching and Nausea And Vomiting   Amoxicillin Itching and Rash    Review of Systems  Constitutional:  Negative for chills, fever and malaise/fatigue.  HENT:  Negative for congestion and sore throat.   Eyes:  Negative for blurred vision, double vision and photophobia.  Respiratory:  Negative for shortness of breath.   Cardiovascular:  Negative for chest pain, palpitations and leg swelling.  Gastrointestinal:  Negative for abdominal pain, constipation, diarrhea, nausea and vomiting.  Genitourinary:  Negative for dysuria, frequency and urgency.  Neurological:  Negative for dizziness, tingling,  tremors, focal weakness and headaches.       Transient aphasia and memory loss  Psychiatric/Behavioral:  Positive for depression and memory loss. Negative for substance abuse and suicidal ideas. The patient is nervous/anxious.        Objective:    Physical Exam Constitutional:      General: She is not in acute distress.    Appearance: She is not ill-appearing.  HENT:     Mouth/Throat:     Mouth: Mucous membranes are moist.  Eyes:     Extraocular Movements: Extraocular movements intact.  Conjunctiva/sclera: Conjunctivae normal.     Pupils: Pupils are equal, round, and reactive to light.  Cardiovascular:     Rate and Rhythm: Normal rate and regular rhythm.  Pulmonary:     Effort: Pulmonary effort is normal.     Breath sounds: Normal breath sounds.  Musculoskeletal:        General: Normal range of motion.     Cervical back: Normal range of motion and neck supple.  Skin:    General: Skin is warm and dry.  Neurological:     General: No focal deficit present.     Mental Status: She is alert and oriented to person, place, and time.     Cranial Nerves: No cranial nerve deficit.     Motor: No weakness.     Coordination: Coordination normal.     Gait: Gait normal.  Psychiatric:        Mood and Affect: Mood normal.        Behavior: Behavior normal.        Thought Content: Thought content normal.      BP 116/78 (BP Location: Left Arm, Patient Position: Sitting, Cuff Size: Normal)   Pulse 74   Temp 97.6 F (36.4 C) (Temporal)   Ht 4\' 8"  (1.422 m)   Wt 124 lb (56.2 kg)   SpO2 99%   BMI 27.80 kg/m  Wt Readings from Last 3 Encounters:  11/20/23 124 lb (56.2 kg)  10/17/23 122 lb (55.3 kg)  08/23/23 124 lb (56.2 kg)       Assessment & Plan:   Problem List Items Addressed This Visit     Anxiety and depression   Relevant Medications   busPIRone (BUSPAR) 5 MG tablet   Other Visit Diagnoses       Transient neurological symptoms    -  Primary     Memory changes          Cerebral cavernoma          Restart Buspar 5 mg.  No red flag symptoms.  She will call to schedule with neurosurgeon. Unable to find record in EMR.  Unclear as to whether she was lost to follow up.  CMA called Dr. Maurine Minister office and patient is ok to call to schedule.   I am having Erika Rogers start on busPIRone. I am also having her maintain her acetaminophen, FeroSul, azelastine, olopatadine, cetirizine, rosuvastatin, triamcinolone, fluticasone, montelukast, and zonisamide.  Meds ordered this encounter  Medications   busPIRone (BUSPAR) 5 MG tablet    Sig: Take 1 tablet (5 mg total) by mouth 2 (two) times daily.    Dispense:  60 tablet    Refill:  0    Supervising Provider:   Hillard Danker A [[id number]]

## 2023-11-20 NOTE — Patient Instructions (Addendum)
Call to schedule with your neurosurgeon.   Dr. Hoyt Koch at [facility] Address: [address]  [address] [place], [place] [phone]  Start taking Buspar for anxiety.   Follow up with me in 3 weeks

## 2023-12-12 ENCOUNTER — Encounter: Payer: Self-pay | Admitting: Family Medicine

## 2023-12-12 ENCOUNTER — Ambulatory Visit (INDEPENDENT_AMBULATORY_CARE_PROVIDER_SITE_OTHER): Payer: Medicare Other | Admitting: Family Medicine

## 2023-12-12 VITALS — BP 92/64 | HR 84 | Temp 97.8°F | Ht <= 58 in | Wt 120.0 lb

## 2023-12-12 DIAGNOSIS — R29818 Other symptoms and signs involving the nervous system: Secondary | ICD-10-CM | POA: Diagnosis not present

## 2023-12-12 DIAGNOSIS — F32A Depression, unspecified: Secondary | ICD-10-CM

## 2023-12-12 DIAGNOSIS — E559 Vitamin D deficiency, unspecified: Secondary | ICD-10-CM

## 2023-12-12 DIAGNOSIS — F419 Anxiety disorder, unspecified: Secondary | ICD-10-CM

## 2023-12-12 DIAGNOSIS — Q283 Other malformations of cerebral vessels: Secondary | ICD-10-CM

## 2023-12-12 DIAGNOSIS — R413 Other amnesia: Secondary | ICD-10-CM | POA: Diagnosis not present

## 2023-12-12 DIAGNOSIS — R7303 Prediabetes: Secondary | ICD-10-CM | POA: Diagnosis not present

## 2023-12-12 DIAGNOSIS — R6889 Other general symptoms and signs: Secondary | ICD-10-CM

## 2023-12-12 LAB — COMPREHENSIVE METABOLIC PANEL
ALT: 9 U/L (ref 0–35)
AST: 17 U/L (ref 0–37)
Albumin: 4.7 g/dL (ref 3.5–5.2)
Alkaline Phosphatase: 111 U/L (ref 39–117)
BUN: 12 mg/dL (ref 6–23)
CO2: 27 meq/L (ref 19–32)
Calcium: 9.7 mg/dL (ref 8.4–10.5)
Chloride: 103 meq/L (ref 96–112)
Creatinine, Ser: 0.79 mg/dL (ref 0.40–1.20)
GFR: 85.04 mL/min (ref >60.00)
Glucose, Bld: 99 mg/dL (ref 70–99)
Potassium: 4 meq/L (ref 3.5–5.1)
Sodium: 138 meq/L (ref 135–145)
Total Bilirubin: 0.9 mg/dL (ref 0.2–1.2)
Total Protein: 7.9 g/dL (ref 6.0–8.3)

## 2023-12-12 LAB — CBC
HCT: 38.4 % (ref 36.0–46.0)
Hemoglobin: 12.6 g/dL (ref 12.0–15.0)
MCHC: 32.9 g/dL (ref 30.0–36.0)
MCV: 79.4 fL (ref 78.0–100.0)
Platelets: 235 10*3/uL (ref 150.0–400.0)
RBC: 4.83 Mil/uL (ref 3.87–5.11)
RDW: 13.8 % (ref 11.5–15.5)
WBC: 4.6 10*3/uL (ref 4.0–10.5)

## 2023-12-12 LAB — TSH: TSH: 1.64 u[IU]/mL (ref 0.35–5.50)

## 2023-12-12 LAB — VITAMIN D 25 HYDROXY (VIT D DEFICIENCY, FRACTURES): VITD: 24.23 ng/mL — ABNORMAL LOW (ref 30.00–100.00)

## 2023-12-12 LAB — HEMOGLOBIN A1C: Hgb A1c MFr Bld: 6.3 % (ref 4.6–6.5)

## 2023-12-12 LAB — T4, FREE: Free T4: 1.09 ng/dL (ref 0.60–1.60)

## 2023-12-12 LAB — VITAMIN B12: Vitamin B-12: 254 pg/mL (ref 211–911)

## 2023-12-12 NOTE — Progress Notes (Signed)
 Subjective:     Patient ID: Erika Rogers, female    DOB: September 22, 1971, 53 y.o.   MRN: [id number]  Chief Complaint  Patient presents with   Anxiety    3 week f/u after restarting buspar , feels like it has been helping her. Has not contacted neurosurgeon     Anxiety Symptoms include nervous/anxious behavior. Patient reports no chest pain, dizziness, nausea, palpitations or shortness of breath.       History of Present Illness         Here to follow up on worsening anxiety and depression after restarting Buspar  5 mg bid. States her mood is worse because of her health issues. States memory seems worse and she has trouble finding her words at times. Ongoing issue. She wants to be able to work.  Denies SI   Requests medication to help with anxiety.    She was last seen at her neurosurgeons office in 2022. Unclear as to why she did not follow up.  Hx of abnormal MRI showing cerebral cavernoma.    Neurosurgeon: Dorn Ned MD [facility] -Cerebral cavernoma and transient neurologic deficit episodes  Vitamin D  low and B12 low normal.   Hx of prediabetes with A1c 6.0%      Health Maintenance Due  Topic Date Due   HIV Screening  Never done   Zoster Vaccines- Shingrix (1 of 2) Never done    Past Medical History:  Diagnosis Date   Allergy    ASCUS of cervix with negative high risk HPV 09/2017   Depression    Migraine     Past Surgical History:  Procedure Laterality Date   APPENDECTOMY     ESOPHAGOGASTRODUODENOSCOPY N/A 05/03/2015   Procedure: ESOPHAGOGASTRODUODENOSCOPY (EGD);  Surgeon: Gordy CHRISTELLA Starch, MD;  Location: [facility] ENDOSCOPY;  Service: Endoscopy;  Laterality: N/A;   NASAL SINUS SURGERY     TONSILLECTOMY      Family History  Problem Relation Age of Onset   Diabetes Brother    Mental illness Brother     Social History   Socioeconomic History   Marital status: Married    Spouse name: Not on file   Number of children: 4   Years of education: Not on  file   Highest education level: Not on file  Occupational History   Not on file  Tobacco Use   Smoking status: Never   Smokeless tobacco: Never  Vaping Use   Vaping status: Never Used  Substance and Sexual Activity   Alcohol use: No    Alcohol/week: 0.0 standard drinks of alcohol   Drug use: No   Sexual activity: Yes    Birth control/protection: None    Comment: 1st intercourse 70 yo-1 partner  Other Topics Concern   Not on file  Social History Narrative   ** Merged History Encounter **       Single. Education: Grade School. Exercise: walks 2 times a week for 2 hours.   Social Drivers of Corporate Investment Banker Strain: Low Risk  (11/17/2023)   Overall Financial Resource Strain (CARDIA)    Difficulty of Paying Living Expenses: Not hard at all  Food Insecurity: No Food Insecurity (11/17/2023)   Hunger Vital Sign    Worried About Running Out of Food in the Last Year: Never true    Ran Out of Food in the Last Year: Never true  Transportation Needs: No Transportation Needs (11/17/2023)   PRAPARE - Administrator, Civil Service (Medical): No  Lack of Transportation (Non-Medical): No  Physical Activity: Insufficiently Active (11/17/2023)   Exercise Vital Sign    Days of Exercise per Week: 4 days    Minutes of Exercise per Session: 20 min  Stress: Stress Concern Present (11/17/2023)   [employer] of Occupational Health - Occupational Stress Questionnaire    Feeling of Stress : Very much  Social Connections: Moderately Integrated (11/17/2023)   Social Connection and Isolation Panel [NHANES]    Frequency of Communication with Friends and Family: Three times a week    Frequency of Social Gatherings with Friends and Family: Twice a week    Attends Religious Services: More than 4 times per year    Active Member of [employer] or Organizations: No    Attends Banker Meetings: Never    Marital Status: Married  Catering Manager Violence: Not At Risk  (11/17/2023)   Humiliation, Afraid, Rape, and Kick questionnaire    Fear of Current or Ex-Partner: No    Emotionally Abused: No    Physically Abused: No    Sexually Abused: No    Outpatient Medications Prior to Visit  Medication Sig Dispense Refill   acetaminophen (TYLENOL) 325 MG tablet Take 650 mg by mouth every 6 (six) hours as needed.     ALPRAZolam  (XANAX  XR) 1 MG 24 hr tablet Take 1 mg by mouth at bedtime.     azelastine  (ASTELIN ) 0.1 % nasal spray Place 1 spray into both nostrils 2 (two) times daily. Use in each nostril as directed 30 mL 12   baclofen (LIORESAL) 10 MG tablet Take 10 mg by mouth 2 (two) times daily. Limit to 1 to 2 treatments per week     busPIRone  (BUSPAR ) 5 MG tablet Take 1 tablet (5 mg total) by mouth 2 (two) times daily. 60 tablet 0   cetirizine  (ZYRTEC ) 10 MG tablet Take 1 tablet (10 mg total) by mouth daily. 90 tablet 1   fluticasone  (FLONASE ) 50 MCG/ACT nasal spray SPRAY 2 SPRAYS INTO EACH NOSTRIL EVERY DAY 16 mL 6   montelukast  (SINGULAIR ) 10 MG tablet TAKE 1 TABLET BY MOUTH AT BEDTIME 90 tablet 1   olopatadine  (PATANOL) 0.1 % ophthalmic solution Place 1 drop into both eyes daily as needed for allergies. 5 mL 12   rosuvastatin  (CRESTOR ) 5 MG tablet Take 1 tablet (5 mg total) by mouth daily. 90 tablet 1   triamcinolone  (KENALOG ) 0.025 % ointment Apply 1 Application topically 2 (two) times daily. 30 g 0   zonisamide (ZONEGRAN) 100 MG capsule Take 100 mg by mouth daily.     FEROSUL 325 (65 Fe) MG tablet TAKE ONE TABLET BY MOUTH DAILY 90 tablet 3   zonisamide (ZONEGRAN) 25 MG capsule Take 25 mg by mouth daily. (Patient not taking: Reported on 12/12/2023)     No facility-administered medications prior to visit.    Allergies  Allergen Reactions   Other Hives, Swelling and Rash    Lysol and Bleach   Beef-Derived Drug Products Itching and Nausea And Vomiting   Shellfish Allergy Itching and Nausea And Vomiting   Amoxicillin Itching and Rash    Review of  Systems  Constitutional:  Positive for malaise/fatigue. Negative for chills and fever.  Respiratory:  Negative for shortness of breath.   Cardiovascular:  Negative for chest pain, palpitations and leg swelling.  Gastrointestinal:  Negative for abdominal pain, constipation, diarrhea, nausea and vomiting.  Neurological:  Negative for dizziness, focal weakness and headaches.       Difficulty finding  words at times   Psychiatric/Behavioral:  Positive for depression and memory loss. The patient is nervous/anxious.        Objective:    Physical Exam Constitutional:      General: She is not in acute distress.    Appearance: She is not ill-appearing.  Eyes:     Extraocular Movements: Extraocular movements intact.     Conjunctiva/sclera: Conjunctivae normal.     Pupils: Pupils are equal, round, and reactive to light.  Cardiovascular:     Rate and Rhythm: Normal rate and regular rhythm.  Pulmonary:     Effort: Pulmonary effort is normal.     Breath sounds: Normal breath sounds.  Musculoskeletal:        General: Normal range of motion.     Cervical back: Normal range of motion and neck supple.  Skin:    General: Skin is warm and dry.  Neurological:     General: No focal deficit present.     Mental Status: She is alert and oriented to person, place, and time.     Cranial Nerves: No cranial nerve deficit or facial asymmetry.     Sensory: No sensory deficit.     Motor: No weakness, tremor or pronator drift.     Coordination: Romberg sign negative. Coordination normal. Finger-Nose-Finger Test normal.     Gait: Gait normal.  Psychiatric:        Mood and Affect: Mood normal.        Behavior: Behavior normal.        Thought Content: Thought content normal.      BP 92/64 (BP Location: Left Arm, Patient Position: Sitting, Cuff Size: Normal)   Pulse 84   Temp 97.8 F (36.6 C) (Temporal)   Ht 4' 8 (1.422 m)   Wt 120 lb (54.4 kg)   SpO2 99%   BMI 26.90 kg/m  Wt Readings from Last 3  Encounters:  12/12/23 120 lb (54.4 kg)  11/20/23 124 lb (56.2 kg)  10/17/23 122 lb (55.3 kg)       Assessment & Plan:   Problem List Items Addressed This Visit     Anxiety and depression - Primary   Relevant Medications   ALPRAZolam  (XANAX  XR) 1 MG 24 hr tablet   Forgetfulness   Prediabetes   Relevant Orders   Hemoglobin A1c   Vitamin D  deficiency   Relevant Orders   VITAMIN D  25 Hydroxy (Vit-D Deficiency, Fractures)   Other Visit Diagnoses       Transient neurological symptoms       Relevant Orders   Ambulatory referral to Neurosurgery     Memory changes       Relevant Orders   CBC   Comprehensive metabolic panel   TSH   T4, free   Vitamin B12   RPR   Ambulatory referral to Neurosurgery     Cerebral cavernoma       Relevant Orders   Ambulatory referral to Neurosurgery      Here to follow up on anxiety. Started Buspar  5 mg daily. Reports improvement in anxiety.  No red flag symptoms.  She did not schedule with neurosurgeon as recommended.  Unclear as to whether she was lost to follow up.  CMA called Dr. Lus office, neurosurgeon and patient needs a new referral in order to be seen. Referral placed.  Check labs for chronic health conditions and for memory issues.   I have discontinued Eldora Gater's FeroSul. I am also having her maintain her  acetaminophen, azelastine , olopatadine , cetirizine , rosuvastatin , triamcinolone , fluticasone , montelukast , busPIRone , zonisamide, ALPRAZolam , and baclofen.  No orders of the defined types were placed in this encounter.

## 2023-12-12 NOTE — Patient Instructions (Addendum)
 I will put in a referral for you to follow up with: Dr. Dorn Ned at [facility] Address: [address]  [address] [place], [place] [phone]   Start taking over the counter vitamin B12 1,000 mcg daily.   Continue taking vitamin D 

## 2023-12-13 LAB — RPR: RPR Ser Ql: NONREACTIVE

## 2023-12-18 ENCOUNTER — Other Ambulatory Visit: Payer: Self-pay | Admitting: Family Medicine

## 2023-12-18 DIAGNOSIS — F32A Depression, unspecified: Secondary | ICD-10-CM

## 2024-01-05 ENCOUNTER — Telehealth: Payer: Self-pay | Admitting: Family Medicine

## 2024-01-05 NOTE — Telephone Encounter (Signed)
LM for [place].

## 2024-01-05 NOTE — Telephone Encounter (Signed)
Copied from CRM [id number]. Topic: Referral - Question >> Jan 05, 2024 12:12 PM Samuel Jester B wrote: Reason for CRM: Rene Kocher called from [facility] and stated that she would like to receive  a callback regarding what the referral is specifically for and the reason for the referral and are there any studies regarding the pt.  Callback number is [phone] ext. [phone].

## 2024-01-05 NOTE — Telephone Encounter (Signed)
Spoke w Erika Rogers, she states pt does not need to come back to them as Dr. Maisie Fus told her that he did not feel pt needed surgry and they referred her to [facility]. They are not sure if pt scheduled w them but she does not need to see them there. I advised them of pt symptoms and they state this needs neurology instead... FYI

## 2024-01-07 ENCOUNTER — Other Ambulatory Visit: Payer: Self-pay | Admitting: Family Medicine

## 2024-01-07 DIAGNOSIS — R29818 Other symptoms and signs involving the nervous system: Secondary | ICD-10-CM

## 2024-01-07 DIAGNOSIS — Q283 Other malformations of cerebral vessels: Secondary | ICD-10-CM

## 2024-01-07 DIAGNOSIS — R413 Other amnesia: Secondary | ICD-10-CM

## 2024-01-22 ENCOUNTER — Encounter: Payer: Self-pay | Admitting: Family Medicine

## 2024-01-22 ENCOUNTER — Ambulatory Visit (INDEPENDENT_AMBULATORY_CARE_PROVIDER_SITE_OTHER): Payer: Medicare Other | Admitting: Family Medicine

## 2024-01-22 ENCOUNTER — Ambulatory Visit: Payer: Self-pay | Admitting: Family Medicine

## 2024-01-22 ENCOUNTER — Ambulatory Visit (INDEPENDENT_AMBULATORY_CARE_PROVIDER_SITE_OTHER): Payer: Medicare Other

## 2024-01-22 VITALS — BP 96/82 | HR 83 | Temp 98.4°F | Ht <= 58 in | Wt 121.0 lb

## 2024-01-22 DIAGNOSIS — R051 Acute cough: Secondary | ICD-10-CM

## 2024-01-22 DIAGNOSIS — R062 Wheezing: Secondary | ICD-10-CM | POA: Diagnosis not present

## 2024-01-22 LAB — POCT INFLUENZA A/B
Influenza A, POC: NEGATIVE
Influenza B, POC: NEGATIVE

## 2024-01-22 LAB — POC COVID19 BINAXNOW: SARS Coronavirus 2 Ag: NEGATIVE

## 2024-01-22 MED ORDER — PREDNISONE 20 MG PO TABS: 40.0000 mg | ORAL_TABLET | Freq: Every day | ORAL | 0 refills | Status: AC

## 2024-01-22 MED ORDER — ALBUTEROL SULFATE HFA 108 (90 BASE) MCG/ACT IN AERS: 2.0000 | INHALATION_SPRAY | Freq: Four times a day (QID) | RESPIRATORY_TRACT | 0 refills | Status: DC | PRN

## 2024-01-22 MED ORDER — HYDROCODONE BIT-HOMATROP MBR 5-1.5 MG/5ML PO SOLN: 5.0000 mL | Freq: Three times a day (TID) | ORAL | 0 refills | Status: DC | PRN

## 2024-01-22 NOTE — Patient Instructions (Signed)
We are getting an xray today. We will be in contact with any abnormal results that require further attention.  I have sent in prednisone for you to take 2 tablets once daily in the morning with breakfast for the next 5 days.  I have sent in an albuterol inhaler for you to use 2 puffs every 4 hours as needed for wheezing.  If there is any pneumonia on the xray, I will also send you an antibiotic if needed.  Follow-up with me for new or worsening symptoms.

## 2024-01-22 NOTE — Telephone Encounter (Signed)
Chief Complaint: Cough Symptoms: Nonstop dry cough for a month, shortness of breath Frequency: Month Pertinent Negatives: Patient denies fever, any other lingering viral symptoms Disposition: [] ED /[] Urgent Care (no appt availability in office) / [x] Appointment(In office/virtual)/ []  [facility]/ [] Home Care/ [] Refused Recommended Disposition /[] [facility]/ []  Follow-up with PCP Additional Notes: Patient called in stating she has been experiencing a dry cough for roughly a month. Patient's other symptoms have resided, including fever, but cough is still lingering. Patient's concern is the shortness of breath that accompanies the cough. Patient used to be asthmatic and utilize an inhaler but she no longer has one. Patient appt created today for evaluation.    Copied from CRM [id number]. Topic: Clinical - Red Word Triage >> Jan 22, 2024  9:44 AM Irine Seal wrote: [facility] that prompted transfer to Nurse Triage:  persistent nonproductive cough for more than a month, no fever, difficulty breathing, chest pain when coughing. Patient wants an acute visit Reason for Disposition  [1] Continuous (nonstop) coughing interferes with work or school AND [2] no improvement using cough treatment per Care Advice  Answer Assessment - Initial Assessment Questions 1. ONSET: "When did the cough begin?"      A month ago 2. SEVERITY: "How bad is the cough today?"      Very bad cough attacks 3. SPUTUM: "Describe the color of your sputum" (none, dry cough; clear, white, yellow, green)     No 4. HEMOPTYSIS: "Are you coughing up any blood?" If so ask: "How much?" (flecks, streaks, tablespoons, etc.)     No 5. DIFFICULTY BREATHING: "Are you having difficulty breathing?" If Yes, ask: "How bad is it?" (e.g., mild, moderate, severe)    - MILD: No SOB at rest, mild SOB with walking, speaks normally in sentences, can lie down, no retractions, pulse < 100.    - MODERATE: SOB at rest, SOB with minimal  exertion and prefers to sit, cannot lie down flat, speaks in phrases, mild retractions, audible wheezing, pulse 100-120.    - SEVERE: Very SOB at rest, speaks in single words, struggling to breathe, sitting hunched forward, retractions, pulse > 120      Moderate at night 6. FEVER: "Do you have a fever?" If Yes, ask: "What is your temperature, how was it measured, and when did it start?"     No, fever has gone away 7. CARDIAC HISTORY: "Do you have any history of heart disease?" (e.g., heart attack, congestive heart failure)      No 8. LUNG HISTORY: "Do you have any history of lung disease?"  (e.g., pulmonary embolus, asthma, emphysema)     Used to have asthma 9. PE RISK FACTORS: "Do you have a history of blood clots?" (or: recent major surgery, recent prolonged travel, bedridden)     No 10. OTHER SYMPTOMS: "Do you have any other symptoms?" (e.g., runny nose, wheezing, chest pain)       Shortness of breath,  Protocols used: Cough - Acute Non-Productive-A-AH

## 2024-01-22 NOTE — Progress Notes (Signed)
Acute Office Visit  Subjective:     Patient ID: Erika Rogers, female    DOB: 29-Nov-1971, 53 y.o.   MRN: [id number]  Chief Complaint  Patient presents with   Acute Visit    Cough, SOB, has tried NyQuil, DayQuil, Cough drops, dry scratchy throat. Patient unsure when symptoms started    HPI Patient is in today for evaluation of dry cough, for the last month. Has tried OTC cough and cold with little relief. Denies known sick contacts. Denies abdominal pain, nausea, vomiting, diarrhea, rash, fever, chills, other symptoms.  Medical hx as outlined below.  ROS Per HPI      Objective:    BP 96/82 (BP Location: Left Arm, Patient Position: Sitting)   Pulse 83   Temp 98.4 F (36.9 C) (Oral)   Ht 4\' 8"  (1.422 m)   Wt 121 lb (54.9 kg)   SpO2 99%   BMI 27.13 kg/m    Physical Exam Vitals and nursing note reviewed.  Constitutional:      General: She is not in acute distress. HENT:     Head: Normocephalic and atraumatic.     Nose: No congestion.     Mouth/Throat:     Mouth: Mucous membranes are moist.     Pharynx: Oropharynx is clear. No oropharyngeal exudate or posterior oropharyngeal erythema.  Eyes:     Extraocular Movements: Extraocular movements intact.  Cardiovascular:     Rate and Rhythm: Normal rate and regular rhythm.  Pulmonary:     Effort: Pulmonary effort is normal. No respiratory distress.     Breath sounds: Wheezing present. No rhonchi or rales.     Comments: Persistent dry cough  Musculoskeletal:     Cervical back: Normal range of motion and neck supple.  Lymphadenopathy:     Cervical: No cervical adenopathy.  Skin:    General: Skin is warm and dry.  Neurological:     Mental Status: She is alert.    No results found for any visits on 01/22/24.      Assessment & Plan:  1. Acute cough (Primary)  - POCT Influenza A/B - POC COVID-19 BinaxNow - DG Chest 2 View; Future - albuterol (VENTOLIN HFA) 108 (90 Base) MCG/ACT inhaler; Inhale 2 puffs into the  lungs every 6 (six) hours as needed for wheezing or shortness of breath.  Dispense: 8 g; Refill: 0 - HYDROcodone bit-homatropine (HYCODAN) 5-1.5 MG/5ML syrup; Take 5 mLs by mouth every 8 (eight) hours as needed for cough.  Dispense: 120 mL; Refill: 0  2. Wheezing  - POCT Influenza A/B - POC COVID-19 BinaxNow - DG Chest 2 View; Future - albuterol (VENTOLIN HFA) 108 (90 Base) MCG/ACT inhaler; Inhale 2 puffs into the lungs every 6 (six) hours as needed for wheezing or shortness of breath.  Dispense: 8 g; Refill: 0 - predniSONE (DELTASONE) 20 MG tablet; Take 2 tablets (40 mg total) by mouth daily for 5 days.  Dispense: 10 tablet; Refill: 0   Meds ordered this encounter  Medications   albuterol (VENTOLIN HFA) 108 (90 Base) MCG/ACT inhaler    Sig: Inhale 2 puffs into the lungs every 6 (six) hours as needed for wheezing or shortness of breath.    Dispense:  8 g    Refill:  0   predniSONE (DELTASONE) 20 MG tablet    Sig: Take 2 tablets (40 mg total) by mouth daily for 5 days.    Dispense:  10 tablet    Refill:  0  HYDROcodone bit-homatropine (HYCODAN) 5-1.5 MG/5ML syrup    Sig: Take 5 mLs by mouth every 8 (eight) hours as needed for cough.    Dispense:  120 mL    Refill:  0    Return if symptoms worsen or fail to improve.  Moshe Cipro, FNP

## 2024-01-27 ENCOUNTER — Other Ambulatory Visit: Payer: Self-pay | Admitting: Family Medicine

## 2024-01-27 DIAGNOSIS — J302 Other seasonal allergic rhinitis: Secondary | ICD-10-CM

## 2024-02-21 ENCOUNTER — Ambulatory Visit: Payer: Medicare Other | Admitting: Family Medicine

## 2024-03-05 ENCOUNTER — Ambulatory Visit: Admitting: Family Medicine

## 2024-03-06 ENCOUNTER — Encounter: Payer: Self-pay | Admitting: Family Medicine

## 2024-03-06 ENCOUNTER — Ambulatory Visit (INDEPENDENT_AMBULATORY_CARE_PROVIDER_SITE_OTHER): Admitting: Family Medicine

## 2024-03-06 VITALS — BP 106/72 | HR 76 | Temp 97.9°F | Ht <= 58 in | Wt 119.0 lb

## 2024-03-06 DIAGNOSIS — J302 Other seasonal allergic rhinitis: Secondary | ICD-10-CM | POA: Diagnosis not present

## 2024-03-06 DIAGNOSIS — R7303 Prediabetes: Secondary | ICD-10-CM | POA: Diagnosis not present

## 2024-03-06 DIAGNOSIS — E782 Mixed hyperlipidemia: Secondary | ICD-10-CM

## 2024-03-06 DIAGNOSIS — E559 Vitamin D deficiency, unspecified: Secondary | ICD-10-CM

## 2024-03-06 DIAGNOSIS — K219 Gastro-esophageal reflux disease without esophagitis: Secondary | ICD-10-CM

## 2024-03-06 MED ORDER — ROSUVASTATIN CALCIUM 5 MG PO TABS: 5.0000 mg | ORAL_TABLET | Freq: Every day | ORAL | 1 refills | Status: DC

## 2024-03-06 MED ORDER — CETIRIZINE HCL 10 MG PO TABS: 10.0000 mg | ORAL_TABLET | Freq: Every day | ORAL | 1 refills | Status: AC

## 2024-03-06 NOTE — Progress Notes (Signed)
 Subjective:     Patient ID: Erika Rogers, female    DOB: [birth year], 53 y.o.   MRN: [id number]  Chief Complaint  Patient presents with   Medical Management of Chronic Issues    HPI   History of Present Illness         She is here to follow-up on chronic health conditions.  Reports feeling better since her previous visit.  Less dizziness and vision issues.  Her mood is better.  Taking vitamin D supplement.  Denies issues with GERD.     Health Maintenance Due  Topic Date Due   HIV Screening  Never done   Zoster Vaccines- Shingrix (1 of 2) Never done   COVID-19 Vaccine (4 - 2024-25 season) 08/06/2023    Past Medical History:  Diagnosis Date   Allergy    ASCUS of cervix with negative high risk HPV 09/2017   Depression    Migraine     Past Surgical History:  Procedure Laterality Date   APPENDECTOMY     ESOPHAGOGASTRODUODENOSCOPY N/A 05/03/2015   Procedure: ESOPHAGOGASTRODUODENOSCOPY (EGD);  Surgeon: Beverley Fiedler, MD;  Location: [facility] ENDOSCOPY;  Service: Endoscopy;  Laterality: N/A;   NASAL SINUS SURGERY     TONSILLECTOMY      Family History  Problem Relation Age of Onset   Diabetes Brother    Mental illness Brother     Social History   Socioeconomic History   Marital status: Married    Spouse name: Not on file   Number of children: 4   Years of education: Not on file   Highest education level: Not on file  Occupational History   Not on file  Tobacco Use   Smoking status: Never   Smokeless tobacco: Never  Vaping Use   Vaping status: Never Used  Substance and Sexual Activity   Alcohol use: No    Alcohol/week: 0.0 standard drinks of alcohol   Drug use: No   Sexual activity: Yes    Birth control/protection: None    Comment: 1st intercourse 48 yo-1 partner  Other Topics Concern   Not on file  Social History Narrative   ** Merged History Encounter **       Single. Education: Grade School. Exercise: walks 2 times a week for 2 hours.   Social  Drivers of Corporate investment banker Strain: Low Risk  (11/17/2023)   Overall Financial Resource Strain (CARDIA)    Difficulty of Paying Living Expenses: Not hard at all  Food Insecurity: No Food Insecurity (11/17/2023)   Hunger Vital Sign    Worried About Running Out of Food in the Last Year: Never true    Ran Out of Food in the Last Year: Never true  Transportation Needs: No Transportation Needs (11/17/2023)   PRAPARE - Administrator, Civil Service (Medical): No    Lack of Transportation (Non-Medical): No  Physical Activity: Insufficiently Active (11/17/2023)   Exercise Vital Sign    Days of Exercise per Week: 4 days    Minutes of Exercise per Session: 20 min  Stress: Stress Concern Present (11/17/2023)   [employer] of Occupational Health - Occupational Stress Questionnaire    Feeling of Stress : Very much  Social Connections: Moderately Integrated (11/17/2023)   Social Connection and Isolation Panel [NHANES]    Frequency of Communication with Friends and Family: Three times a week    Frequency of Social Gatherings with Friends and Family: Twice a week    Attends Religious  Services: More than 4 times per year    Active Member of Clubs or Organizations: No    Attends Banker Meetings: Never    Marital Status: Married  Catering manager Violence: Not At Risk (11/17/2023)   Humiliation, Afraid, Rape, and Kick questionnaire    Fear of Current or Ex-Partner: No    Emotionally Abused: No    Physically Abused: No    Sexually Abused: No    Outpatient Medications Prior to Visit  Medication Sig Dispense Refill   azelastine (ASTELIN) 0.1 % nasal spray Place 1 spray into both nostrils 2 (two) times daily. Use in each nostril as directed 30 mL 12   baclofen (LIORESAL) 10 MG tablet Take 10 mg by mouth 2 (two) times daily. Limit to 1 to 2 treatments per week     busPIRone (BUSPAR) 5 MG tablet TAKE 1 TABLET BY MOUTH 2 TIMES A DAY 60 tablet 2    Cholecalciferol (VITAMIN D3) 250 MCG (10000 UT) capsule Take 10,000 Units by mouth daily.     fluticasone (FLONASE) 50 MCG/ACT nasal spray SPRAY 2 SPRAYS INTO EACH NOSTRIL EVERY DAY 16 mL 6   montelukast (SINGULAIR) 10 MG tablet TAKE 1 TABLET BY MOUTH AT BEDTIME 90 tablet 3   olopatadine (PATANOL) 0.1 % ophthalmic solution Place 1 drop into both eyes daily as needed for allergies. 5 mL 12   zonisamide (ZONEGRAN) 100 MG capsule Take 100 mg by mouth daily.     cetirizine (ZYRTEC) 10 MG tablet Take 1 tablet (10 mg total) by mouth daily. 90 tablet 1   acetaminophen (TYLENOL) 325 MG tablet Take 650 mg by mouth every 6 (six) hours as needed. (Patient not taking: Reported on 03/06/2024)     ALPRAZolam (XANAX XR) 1 MG 24 hr tablet Take 1 mg by mouth at bedtime. (Patient not taking: Reported on 03/06/2024)     albuterol (VENTOLIN HFA) 108 (90 Base) MCG/ACT inhaler Inhale 2 puffs into the lungs every 6 (six) hours as needed for wheezing or shortness of breath. (Patient not taking: Reported on 03/06/2024) 8 g 0   HYDROcodone bit-homatropine (HYCODAN) 5-1.5 MG/5ML syrup Take 5 mLs by mouth every 8 (eight) hours as needed for cough. 120 mL 0   rosuvastatin (CRESTOR) 5 MG tablet Take 1 tablet (5 mg total) by mouth daily. (Patient not taking: Reported on 03/06/2024) 90 tablet 1   triamcinolone (KENALOG) 0.025 % ointment Apply 1 Application topically 2 (two) times daily. (Patient not taking: Reported on 03/06/2024) 30 g 0   No facility-administered medications prior to visit.    Allergies  Allergen Reactions   Other Hives, Swelling and Rash    Lysol and Bleach   Beef-Derived Drug Products Itching and Nausea And Vomiting   Shellfish Allergy Itching and Nausea And Vomiting   Amoxicillin Itching and Rash    Review of Systems  Constitutional:  Negative for chills, fever and malaise/fatigue.  Respiratory:  Negative for cough and shortness of breath.   Cardiovascular:  Negative for chest pain, palpitations and leg  swelling.  Gastrointestinal:  Negative for abdominal pain, constipation, diarrhea, nausea and vomiting.  Genitourinary:  Negative for dysuria, frequency and urgency.  Neurological:  Negative for dizziness, focal weakness and headaches.  Psychiatric/Behavioral:  Negative for depression. The patient is not nervous/anxious.        Objective:    Physical Exam Constitutional:      General: She is not in acute distress.    Appearance: She is not ill-appearing.  Eyes:     Extraocular Movements: Extraocular movements intact.     Conjunctiva/sclera: Conjunctivae normal.  Cardiovascular:     Rate and Rhythm: Normal rate.  Pulmonary:     Effort: Pulmonary effort is normal.  Musculoskeletal:     Cervical back: Normal range of motion and neck supple.  Skin:    General: Skin is warm and dry.  Neurological:     General: No focal deficit present.     Mental Status: She is alert and oriented to person, place, and time.  Psychiatric:        Mood and Affect: Mood normal.        Behavior: Behavior normal.        Thought Content: Thought content normal.      BP 106/72 (BP Location: Left Arm, Patient Position: Sitting)   Pulse 76   Temp 97.9 F (36.6 C) (Temporal)   Ht 4\' 8"  (1.422 m)   Wt 119 lb (54 kg)   SpO2 98%   BMI 26.68 kg/m  Wt Readings from Last 3 Encounters:  03/06/24 119 lb (54 kg)  01/22/24 121 lb (54.9 kg)  12/12/23 120 lb (54.4 kg)       Assessment & Plan:   Problem List Items Addressed This Visit     GERD   Mixed hyperlipidemia - Primary   Relevant Medications   rosuvastatin (CRESTOR) 5 MG tablet   Prediabetes   Seasonal allergies   Relevant Medications   cetirizine (ZYRTEC) 10 MG tablet   Vitamin D deficiency   Upcoming appointment with neurology She plans to call and schedule with her gynecologist Continue allergy medication. Reports being out of Crestor for the past 3 weeks.  Refilled medication.  Encouraged her to follow-up for a fasting visit in  approximately 2 months.  I have discontinued Kyrin Monical's triamcinolone, albuterol, and HYDROcodone bit-homatropine. I am also having her maintain her acetaminophen, azelastine, olopatadine, fluticasone, zonisamide, ALPRAZolam, baclofen, busPIRone, montelukast, cetirizine, Vitamin D3, and rosuvastatin.  Meds ordered this encounter  Medications   cetirizine (ZYRTEC) 10 MG tablet    Sig: Take 1 tablet (10 mg total) by mouth daily.    Dispense:  90 tablet    Refill:  1   rosuvastatin (CRESTOR) 5 MG tablet    Sig: Take 1 tablet (5 mg total) by mouth daily.    Dispense:  90 tablet    Refill:  1    Supervising Provider:   Hillard Danker A [[id number]]

## 2024-03-06 NOTE — Patient Instructions (Signed)
 Please schedule with your gynecologist for breast exam, mammogram and pap smear.   I will see you back in June for a fasting follow up. Do not eat that morning.

## 2024-03-18 ENCOUNTER — Other Ambulatory Visit: Payer: Self-pay | Admitting: Family Medicine

## 2024-03-18 DIAGNOSIS — F32A Depression, unspecified: Secondary | ICD-10-CM

## 2024-04-10 ENCOUNTER — Ambulatory Visit: Payer: Medicare Other | Admitting: Family Medicine

## 2024-05-06 ENCOUNTER — Ambulatory Visit (INDEPENDENT_AMBULATORY_CARE_PROVIDER_SITE_OTHER): Payer: Medicare Other | Admitting: Diagnostic Neuroimaging

## 2024-05-06 ENCOUNTER — Encounter: Payer: Self-pay | Admitting: Diagnostic Neuroimaging

## 2024-05-06 VITALS — BP 115/72 | HR 62 | Ht <= 58 in | Wt 119.0 lb

## 2024-05-06 DIAGNOSIS — R413 Other amnesia: Secondary | ICD-10-CM | POA: Diagnosis not present

## 2024-05-06 DIAGNOSIS — F419 Anxiety disorder, unspecified: Secondary | ICD-10-CM | POA: Diagnosis not present

## 2024-05-06 DIAGNOSIS — R41840 Attention and concentration deficit: Secondary | ICD-10-CM | POA: Diagnosis not present

## 2024-05-06 NOTE — Progress Notes (Signed)
 [facility]  PATIENT: Erika Rogers DOB: Dec 06, 1970  REFERRING CLINICIAN: Henson, Vickie L, NP-C HISTORY FROM: patient (via daughter who translates) REASON FOR VISIT: new consult   HISTORICAL  CHIEF COMPLAINT:  Chief Complaint  Patient presents with   New Patient (Initial Visit)    Pt with daughter, who is translating for her. Rm 6. She is forgetful with apts, medications. 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 She gets forgetful easily and that causes her to be stressed then causing headaches and other symptoms. MRI c/o feb 2025. Has noticed this over the last two years. Sometimes in middle of conversation can forget what talking about. Forgetfullness and memory seems to be more associated with anxiety concerns.     HISTORY OF PRESENT ILLNESS:   53 year old female here for evaluation of memory loss.  Patient is a refugee from [place] and is [place]  citizen.  However in the last 1-2 years has been having increasing stress, anxiety and concerned about news related events and worrying that she may have to go back to [place].  Also has been having increasing episodes of difficulty with attention, focus, short-term memory loss.  She is able to maintain her ADLs.  She lives with her husband and her brother who she is the primary caregiver of.   REVIEW OF SYSTEMS: Full 14 system review of systems performed and negative with exception of: as per HPI.   ALLERGIES: Allergies  Allergen Reactions   Other Hives, Swelling and Rash    Lysol and Bleach   Beef-Derived Drug Products Itching and Nausea And Vomiting   Shellfish Allergy Itching and Nausea And Vomiting   Amoxicillin Itching and Rash    HOME MEDICATIONS: Outpatient Medications Prior to Visit  Medication Sig Dispense Refill   acetaminophen (TYLENOL) 325 MG tablet Take 650 mg by mouth every 6 (six) hours as needed.     ALPRAZolam  (XANAX  XR) 1 MG 24 hr tablet Take 1 mg  by mouth at bedtime.     azelastine  (ASTELIN ) 0.1 % nasal spray Place 1 spray into both nostrils 2 (two) times daily. Use in each nostril as directed 30 mL 12   baclofen (LIORESAL) 10 MG tablet Take 10 mg by mouth 2 (two) times daily. Limit to 1 to 2 treatments per week     busPIRone  (BUSPAR ) 5 MG tablet TAKE 1 TABLET BY MOUTH 2 TIMES A DAY 60 tablet 2   cetirizine  (ZYRTEC ) 10 MG tablet Take 1 tablet (10 mg total) by mouth daily. 90 tablet 1   Cholecalciferol (VITAMIN D3) 250 MCG (10000 UT) capsule Take 10,000 Units by mouth daily.     Cyanocobalamin  (VITAMIN B 12 PO) Take 1,000 mcg by mouth daily.     fluticasone  (FLONASE ) 50 MCG/ACT nasal spray SPRAY 2 SPRAYS INTO EACH NOSTRIL EVERY DAY 16 mL 6   montelukast  (SINGULAIR ) 10 MG tablet TAKE 1 TABLET BY MOUTH AT BEDTIME 90 tablet 3   olopatadine  (PATANOL) 0.1 % ophthalmic solution Place 1 drop into both eyes daily as needed for allergies. 5 mL 12   rosuvastatin  (CRESTOR ) 5 MG tablet Take 1 tablet (5 mg total) by mouth daily. 90 tablet 1   sertraline  (ZOLOFT ) 25 MG tablet Take 25 mg by mouth daily.     zonisamide (ZONEGRAN) 100 MG capsule Take 100 mg by mouth daily.     No facility-administered medications prior to visit.    PAST MEDICAL HISTORY: Past Medical  History:  Diagnosis Date   Allergy    ASCUS of cervix with negative high risk HPV 09/2017   Depression    Migraine     PAST SURGICAL HISTORY: Past Surgical History:  Procedure Laterality Date   APPENDECTOMY     ESOPHAGOGASTRODUODENOSCOPY N/A 05/03/2015   Procedure: ESOPHAGOGASTRODUODENOSCOPY (EGD);  Surgeon: Nannette Babe, MD;  Location: [facility] ENDOSCOPY;  Service: Endoscopy;  Laterality: N/A;   NASAL SINUS SURGERY     TONSILLECTOMY      FAMILY HISTORY: Family History  Problem Relation Age of Onset   Diabetes Brother    Mental illness Brother     SOCIAL HISTORY: Social History   Socioeconomic History   Marital status: Married    Spouse name: Not on file   Number of  children: 4   Years of education: Not on file   Highest education level: Not on file  Occupational History   Not on file  Tobacco Use   Smoking status: Never   Smokeless tobacco: Never  Vaping Use   Vaping status: Never Used  Substance and Sexual Activity   Alcohol use: No    Alcohol/week: 0.0 standard drinks of alcohol   Drug use: No   Sexual activity: Yes    Birth control/protection: None    Comment: 1st intercourse 82 yo-1 partner  Other Topics Concern   Not on file  Social History Narrative   ** Merged History Encounter **       Single. Education: Grade School. Exercise: walks 2 times a week for 2 hours.   Social Drivers of Corporate investment banker Strain: Low Risk  (11/17/2023)   Overall Financial Resource Strain (CARDIA)    Difficulty of Paying Living Expenses: Not hard at all  Food Insecurity: No Food Insecurity (11/17/2023)   Hunger Vital Sign    Worried About Running Out of Food in the Last Year: Never true    Ran Out of Food in the Last Year: Never true  Transportation Needs: No Transportation Needs (11/17/2023)   PRAPARE - Administrator, Civil Service (Medical): No    Lack of Transportation (Non-Medical): No  Physical Activity: Insufficiently Active (11/17/2023)   Exercise Vital Sign    Days of Exercise per Week: 4 days    Minutes of Exercise per Session: 20 min  Stress: Stress Concern Present (11/17/2023)   [employer] of Occupational Health - Occupational Stress Questionnaire    Feeling of Stress : Very much  Social Connections: Moderately Integrated (11/17/2023)   Social Connection and Isolation Panel [NHANES]    Frequency of Communication with Friends and Family: Three times a week    Frequency of Social Gatherings with Friends and Family: Twice a week    Attends Religious Services: More than 4 times per year    Active Member of [employer] or Organizations: No    Attends Banker Meetings: Never    Marital Status: Married   Catering manager Violence: Not At Risk (11/17/2023)   Humiliation, Afraid, Rape, and Kick questionnaire    Fear of Current or Ex-Partner: No    Emotionally Abused: No    Physically Abused: No    Sexually Abused: No     PHYSICAL EXAM  GENERAL EXAM/CONSTITUTIONAL: Vitals:  Vitals:   05/06/24 1048  BP: 115/72  Pulse: 62  Weight: 119 lb (54 kg)  Height: 4\' 8"  (1.422 m)   Body mass index is 26.68 kg/m. Wt Readings from Last 3 Encounters:  05/06/24 119  lb (54 kg)  03/06/24 119 lb (54 kg)  01/22/24 121 lb (54.9 kg)   Patient is in no distress; well developed, nourished and groomed; neck is supple  CARDIOVASCULAR: Examination of carotid arteries is normal; no carotid bruits Regular rate and rhythm, no murmurs Examination of peripheral vascular system by observation and palpation is normal  EYES: Ophthalmoscopic exam of optic discs and posterior segments is normal; no papilledema or hemorrhages No results found.  MUSCULOSKELETAL: Gait, strength, tone, movements noted in Neurologic exam below  NEUROLOGIC: MENTAL STATUS:      No data to display            05/06/2024   10:57 AM  Montreal Cognitive Assessment   Visuospatial/ Executive (0/5) 3  Naming (0/3) 3  Attention: Read list of digits (0/2) 2  Attention: Read list of letters (0/1) 1  Attention: Serial 7 subtraction starting at 100 (0/3) 3  Language: Repeat phrase (0/2) 0  Language : Fluency (0/1) 0  Abstraction (0/2) 1  Delayed Recall (0/5) 3  Orientation (0/6) 6  Total 22    awake, alert, oriented to person, place and time recent and remote memory intact normal attention and concentration language fluent, comprehension intact, naming intact fund of knowledge appropriate  CRANIAL NERVE:  2nd - no papilledema on fundoscopic exam 2nd, 3rd, 4th, 6th - pupils equal and reactive to light, visual fields full to confrontation, extraocular muscles intact, no nystagmus 5th - facial sensation symmetric 7th -  facial strength symmetric 8th - hearing intact 9th - palate elevates symmetrically, uvula midline 11th - shoulder shrug symmetric 12th - tongue protrusion midline  MOTOR:  normal bulk and tone, full strength in the BUE, BLE  SENSORY:  normal and symmetric to light touch, temperature, vibration  COORDINATION:  finger-nose-finger, fine finger movements normal  REFLEXES:  deep tendon reflexes 1+ and symmetric  GAIT/STATION:  narrow based gait    DIAGNOSTIC DATA (LABS, IMAGING, TESTING) - I reviewed patient records, labs, notes, testing and imaging myself where available.  Lab Results  Component Value Date   WBC 4.6 12/12/2023   HGB 12.6 12/12/2023   HCT 38.4 12/12/2023   MCV 79.4 12/12/2023   PLT 235.0 12/12/2023      Component Value Date/Time   NA 138 12/12/2023 1054   K 4.0 12/12/2023 1054   CL 103 12/12/2023 1054   CO2 27 12/12/2023 1054   GLUCOSE 99 12/12/2023 1054   BUN 12 12/12/2023 1054   CREATININE 0.79 12/12/2023 1054   CREATININE 0.83 01/29/2020 1600   CALCIUM  9.7 12/12/2023 1054   PROT 7.9 12/12/2023 1054   ALBUMIN 4.7 12/12/2023 1054   AST 17 12/12/2023 1054   ALT 9 12/12/2023 1054   ALKPHOS 111 12/12/2023 1054   BILITOT 0.9 12/12/2023 1054   GFRNONAA >60 05/05/2015 0540   GFRAA >60 05/05/2015 0540   Lab Results  Component Value Date   CHOL 187 10/17/2023   HDL 60.00 10/17/2023   LDLCALC 84 10/17/2023   LDLDIRECT 153.0 04/18/2023   TRIG 211.0 (H) 10/17/2023   CHOLHDL 3 10/17/2023   Lab Results  Component Value Date   HGBA1C 6.3 12/12/2023   Lab Results  Component Value Date   VITAMINB12 254 12/12/2023   Lab Results  Component Value Date   TSH 1.64 12/12/2023    01/19/21 MRI brain [I reviewed images myself and agree with interpretation. -VRP]  1. Small (5 mm) lesion in the high left parietal lobe with associated susceptibility artifact, peripheral T2  hypointensity, and adjacent developmental venous anomaly. 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 No evidence of surrounding edema, mass effect or acute hemorrhage. 2. Otherwise, no acute abnormality.   ASSESSMENT AND PLAN  53 y.o. year old female here with:   Dx:  1. Memory loss   2. Attention and concentration deficit   3. Anxiety     PLAN:  MILD MEMORY LOSS (decreased attention, memory; could be related to underlying anxiety, stress, PTSD and insomnia) - refer to neuropsychology for testing - check EEG - B12 level is low normal; follow up with PCP for injections  Orders Placed This Encounter  Procedures   Ambulatory referral to Neuropsychology   EEG adult   Return for pending test results, pending if symptoms worsen or fail to improve.    Omega Bible, MD 05/06/2024, 11:49 AM Certified in Neurology, Neurophysiology and Neuroimaging  [facility] [address] [place], [place] [address] [phone]

## 2024-05-06 NOTE — Patient Instructions (Addendum)
  MILD MEMORY LOSS (decreased attention, memory; could be related to underlying anxiety, stress, PTSD and insomnia) - refer to neuropsychology for testing - check EEG - B12 level is low normal; follow up with PCP for injections

## 2024-05-07 ENCOUNTER — Telehealth: Payer: Self-pay | Admitting: Diagnostic Neuroimaging

## 2024-05-07 NOTE — Telephone Encounter (Signed)
 Referral for neuropsychology fax to [facility]. Phone: [phone], Fax: [phone]

## 2024-05-13 ENCOUNTER — Other Ambulatory Visit: Admitting: *Deleted

## 2024-05-20 ENCOUNTER — Ambulatory Visit (INDEPENDENT_AMBULATORY_CARE_PROVIDER_SITE_OTHER): Admitting: Diagnostic Neuroimaging

## 2024-05-20 DIAGNOSIS — F419 Anxiety disorder, unspecified: Secondary | ICD-10-CM

## 2024-05-20 DIAGNOSIS — R413 Other amnesia: Secondary | ICD-10-CM | POA: Diagnosis not present

## 2024-05-20 DIAGNOSIS — R41840 Attention and concentration deficit: Secondary | ICD-10-CM

## 2024-05-24 ENCOUNTER — Encounter: Payer: Self-pay | Admitting: Psychology

## 2024-05-29 ENCOUNTER — Encounter: Payer: Self-pay | Admitting: Family Medicine

## 2024-05-29 ENCOUNTER — Ambulatory Visit (INDEPENDENT_AMBULATORY_CARE_PROVIDER_SITE_OTHER): Admitting: Family Medicine

## 2024-05-29 VITALS — BP 102/70 | HR 75 | Temp 97.6°F | Ht <= 58 in | Wt 117.0 lb

## 2024-05-29 DIAGNOSIS — M25612 Stiffness of left shoulder, not elsewhere classified: Secondary | ICD-10-CM | POA: Insufficient documentation

## 2024-05-29 DIAGNOSIS — M25512 Pain in left shoulder: Secondary | ICD-10-CM | POA: Diagnosis not present

## 2024-05-29 DIAGNOSIS — G8929 Other chronic pain: Secondary | ICD-10-CM | POA: Insufficient documentation

## 2024-05-29 DIAGNOSIS — F419 Anxiety disorder, unspecified: Secondary | ICD-10-CM

## 2024-05-29 DIAGNOSIS — E782 Mixed hyperlipidemia: Secondary | ICD-10-CM

## 2024-05-29 DIAGNOSIS — E538 Deficiency of other specified B group vitamins: Secondary | ICD-10-CM | POA: Diagnosis not present

## 2024-05-29 DIAGNOSIS — E559 Vitamin D deficiency, unspecified: Secondary | ICD-10-CM | POA: Diagnosis not present

## 2024-05-29 DIAGNOSIS — F32A Depression, unspecified: Secondary | ICD-10-CM

## 2024-05-29 DIAGNOSIS — R7303 Prediabetes: Secondary | ICD-10-CM | POA: Diagnosis not present

## 2024-05-29 DIAGNOSIS — Z1231 Encounter for screening mammogram for malignant neoplasm of breast: Secondary | ICD-10-CM

## 2024-05-29 LAB — COMPREHENSIVE METABOLIC PANEL WITH GFR
ALT: 9 U/L (ref 0–35)
AST: 18 U/L (ref 0–37)
Albumin: 4.4 g/dL (ref 3.5–5.2)
Alkaline Phosphatase: 99 U/L (ref 39–117)
BUN: 13 mg/dL (ref 6–23)
CO2: 26 meq/L (ref 19–32)
Calcium: 9.1 mg/dL (ref 8.4–10.5)
Chloride: 106 meq/L (ref 96–112)
Creatinine, Ser: 0.91 mg/dL (ref 0.40–1.20)
GFR: 71.54 mL/min (ref >60.00)
Glucose, Bld: 105 mg/dL — ABNORMAL HIGH (ref 70–99)
Potassium: 3.9 meq/L (ref 3.5–5.1)
Sodium: 139 meq/L (ref 135–145)
Total Bilirubin: 0.9 mg/dL (ref 0.2–1.2)
Total Protein: 7.6 g/dL (ref 6.0–8.3)

## 2024-05-29 LAB — LIPID PANEL
Cholesterol: 273 mg/dL — ABNORMAL HIGH (ref 0–200)
HDL: 59.4 mg/dL (ref >39.00)
LDL Cholesterol: 177 mg/dL — ABNORMAL HIGH (ref 0–99)
NonHDL: 213.98
Total CHOL/HDL Ratio: 5
Triglycerides: 186 mg/dL — ABNORMAL HIGH (ref 0.0–149.0)
VLDL: 37.2 mg/dL (ref 0.0–40.0)

## 2024-05-29 LAB — TSH: TSH: 1.3 u[IU]/mL (ref 0.35–5.50)

## 2024-05-29 LAB — CBC
HCT: 38.2 % (ref 36.0–46.0)
Hemoglobin: 12.4 g/dL (ref 12.0–15.0)
MCHC: 32.5 g/dL (ref 30.0–36.0)
MCV: 76.9 fl — ABNORMAL LOW (ref 78.0–100.0)
Platelets: 248 10*3/uL (ref 150.0–400.0)
RBC: 4.97 Mil/uL (ref 3.87–5.11)
RDW: 14.3 % (ref 11.5–15.5)
WBC: 4.7 10*3/uL (ref 4.0–10.5)

## 2024-05-29 LAB — VITAMIN D 25 HYDROXY (VIT D DEFICIENCY, FRACTURES): VITD: 22.06 ng/mL — ABNORMAL LOW (ref 30.00–100.00)

## 2024-05-29 LAB — T4, FREE: Free T4: 1 ng/dL (ref 0.60–1.60)

## 2024-05-29 LAB — VITAMIN B12: Vitamin B-12: 289 pg/mL (ref 211–911)

## 2024-05-29 LAB — HEMOGLOBIN A1C: Hgb A1c MFr Bld: 6.2 % (ref 4.6–6.5)

## 2024-05-29 NOTE — Assessment & Plan Note (Signed)
 This is a new problem brought to my attention today.  Ongoing for the past 4 years.  Limited range of motion on exam but otherwise unremarkable.  Referral to [facility] sports medicine for further evaluation and treatment.

## 2024-05-29 NOTE — Patient Instructions (Signed)
 Please go downstairs for labs.   I referred you to [facility] for your left shoulder pain and they will call you.   Please call [facility] to schedule your mammogram. I placed the order for this.  [phone]   Check with your pharmacy regarding the Shingrix vaccines to prevent shingles.

## 2024-05-29 NOTE — Assessment & Plan Note (Signed)
 She is fasting today.  Reports good compliance with her cholesterol medication since I last saw her in April.  Check lipids and follow up.

## 2024-05-29 NOTE — Assessment & Plan Note (Signed)
 Reports mood is good.  Denies any concerns

## 2024-05-29 NOTE — Assessment & Plan Note (Signed)
 Continue working on a healthy diet and exercise.  Last A1c 6.3%.  Check A1c today and follow-up.

## 2024-05-29 NOTE — Assessment & Plan Note (Signed)
 Reports taking an over-the-counter B12 supplement.  Check vitamin B12 level and follow-up

## 2024-05-29 NOTE — Progress Notes (Signed)
 Subjective:     Patient ID: Erika Rogers, female    DOB: [birth year], 53 y.o.   MRN: [id number]  Chief Complaint  Patient presents with   Medical Management of Chronic Issues    2 month f/u, fasting    HPI History of Present Illness         She is here to follow-up on chronic health conditions including prediabetes, hyperlipidemia, vitamin D  and B12 insufficiencies.  An in person medical interpreter is present today.  Reports taking Crestor  daily since I saw her in April.  She has a new complaint today.  Complains of left shoulder pain for the past 4 years.  States she keeps forgetting to mention it to me.  She has issues with range of motion with her left shoulder.  Denies injury.  Does not take anything for this. Denies numbness or weakness.  Denies neck pain.  She is overdue for mammogram  She recently saw neurology for memory and occasional speech issues.  Referred to neuro surgery.     Health Maintenance Due  Topic Date Due   HIV Screening  Never done   Hepatitis B Vaccines (1 of 3 - 19+ 3-dose series) Never done   Zoster Vaccines- Shingrix (1 of 2) Never done   MAMMOGRAM  04/19/2024    Past Medical History:  Diagnosis Date   Allergy    ASCUS of cervix with negative high risk HPV 09/2017   Depression    Migraine     Past Surgical History:  Procedure Laterality Date   APPENDECTOMY     ESOPHAGOGASTRODUODENOSCOPY N/A 05/03/2015   Procedure: ESOPHAGOGASTRODUODENOSCOPY (EGD);  Surgeon: Gordy CHRISTELLA Starch, MD;  Location[facility] ENDOSCOPY;  Service: Endoscopy;  Laterality: N/A;   NASAL SINUS SURGERY     TONSILLECTOMY      Family History  Problem Relation Age of Onset   Diabetes Brother    Mental illness Brother     Social History   Socioeconomic History   Marital status: Married    Spouse name: Not on file   Number of children: 4   Years of education: Not on file   Highest education level: Not on file  Occupational History   Not on file  Tobacco Use   Smoking  status: Never   Smokeless tobacco: Never  Vaping Use   Vaping status: Never Used  Substance and Sexual Activity   Alcohol use: No    Alcohol/week: 0.0 standard drinks of alcohol   Drug use: No   Sexual activity: Yes    Birth control/protection: None    Comment: 1st intercourse 14 yo-1 partner  Other Topics Concern   Not on file  Social History Narrative   ** Merged History Encounter **       Single. Education: Grade School. Exercise: walks 2 times a week for 2 hours.   Social Drivers of Corporate investment banker Strain: Low Risk  (11/17/2023)   Overall Financial Resource Strain (CARDIA)    Difficulty of Paying Living Expenses: Not hard at all  Food Insecurity: No Food Insecurity (11/17/2023)   Hunger Vital Sign    Worried About Running Out of Food in the Last Year: Never true    Ran Out of Food in the Last Year: Never true  Transportation Needs: No Transportation Needs (11/17/2023)   PRAPARE - Administrator, Civil Service (Medical): No    Lack of Transportation (Non-Medical): No  Physical Activity: Insufficiently Active (11/17/2023)   Exercise Vital  Sign    Days of Exercise per Week: 4 days    Minutes of Exercise per Session: 20 min  Stress: Stress Concern Present (11/17/2023)   [employer] of Occupational Health - Occupational Stress Questionnaire    Feeling of Stress : Very much  Social Connections: Moderately Integrated (11/17/2023)   Social Connection and Isolation Panel    Frequency of Communication with Friends and Family: Three times a week    Frequency of Social Gatherings with Friends and Family: Twice a week    Attends Religious Services: More than 4 times per year    Active Member of [employer] or Organizations: No    Attends Banker Meetings: Never    Marital Status: Married  Catering manager Violence: Not At Risk (11/17/2023)   Humiliation, Afraid, Rape, and Kick questionnaire    Fear of Current or Ex-Partner: No    Emotionally  Abused: No    Physically Abused: No    Sexually Abused: No    Outpatient Medications Prior to Visit  Medication Sig Dispense Refill   acetaminophen (TYLENOL) 325 MG tablet Take 650 mg by mouth every 6 (six) hours as needed.     ALPRAZolam  (XANAX  XR) 1 MG 24 hr tablet Take 1 mg by mouth at bedtime.     azelastine  (ASTELIN ) 0.1 % nasal spray Place 1 spray into both nostrils 2 (two) times daily. Use in each nostril as directed 30 mL 12   baclofen (LIORESAL) 10 MG tablet Take 10 mg by mouth 2 (two) times daily. Limit to 1 to 2 treatments per week     busPIRone  (BUSPAR ) 5 MG tablet TAKE 1 TABLET BY MOUTH 2 TIMES A DAY 60 tablet 2   cetirizine  (ZYRTEC ) 10 MG tablet Take 1 tablet (10 mg total) by mouth daily. 90 tablet 1   Cholecalciferol (VITAMIN D3) 250 MCG (10000 UT) capsule Take 10,000 Units by mouth daily.     Cyanocobalamin  (VITAMIN B 12 PO) Take 1,000 mcg by mouth daily.     fluticasone  (FLONASE ) 50 MCG/ACT nasal spray SPRAY 2 SPRAYS INTO EACH NOSTRIL EVERY DAY 16 mL 6   montelukast  (SINGULAIR ) 10 MG tablet TAKE 1 TABLET BY MOUTH AT BEDTIME 90 tablet 3   olopatadine  (PATANOL) 0.1 % ophthalmic solution Place 1 drop into both eyes daily as needed for allergies. 5 mL 12   rosuvastatin  (CRESTOR ) 5 MG tablet Take 1 tablet (5 mg total) by mouth daily. 90 tablet 1   sertraline  (ZOLOFT ) 25 MG tablet Take 25 mg by mouth daily.     zonisamide (ZONEGRAN) 100 MG capsule Take 100 mg by mouth daily.     No facility-administered medications prior to visit.    Allergies  Allergen Reactions   Other Hives, Swelling and Rash    Lysol and Bleach   Beef-Derived Drug Products Itching and Nausea And Vomiting   Shellfish Allergy Itching and Nausea And Vomiting   Amoxicillin Itching and Rash    Review of Systems  Constitutional:  Negative for chills, fever and malaise/fatigue.  Eyes:  Negative for blurred vision, double vision and photophobia.  Respiratory:  Negative for shortness of breath.    Cardiovascular:  Negative for chest pain, palpitations and leg swelling.  Gastrointestinal:  Negative for abdominal pain, constipation, diarrhea, nausea and vomiting.  Genitourinary:  Negative for dysuria, frequency and urgency.  Musculoskeletal:  Positive for joint pain. Negative for falls.       Chronic left shoulder pain  Neurological:  Negative for dizziness, focal  weakness and headaches.  Psychiatric/Behavioral:  Negative for depression and suicidal ideas. The patient is not nervous/anxious.        Objective:    Physical Exam Constitutional:      General: She is not in acute distress.    Appearance: She is not ill-appearing.  HENT:     Mouth/Throat:     Mouth: Mucous membranes are moist.     Pharynx: Oropharynx is clear.   Eyes:     Extraocular Movements: Extraocular movements intact.     Conjunctiva/sclera: Conjunctivae normal.    Cardiovascular:     Rate and Rhythm: Normal rate.  Pulmonary:     Effort: Pulmonary effort is normal.   Musculoskeletal:     Right shoulder: Normal.     Left shoulder: No swelling, tenderness or bony tenderness. Decreased range of motion. Normal strength. Normal pulse.     Right upper arm: Normal.     Left upper arm: Normal.     Right hand: No swelling. Normal sensation. Normal capillary refill. Normal pulse.     Left hand: No swelling. Normal sensation. Normal capillary refill. Normal pulse.     Cervical back: Normal range of motion and neck supple. No tenderness.     Right lower leg: No edema.     Left lower leg: No edema.   Skin:    General: Skin is warm and dry.   Neurological:     General: No focal deficit present.     Mental Status: She is alert and oriented to person, place, and time.   Psychiatric:        Mood and Affect: Mood normal.        Behavior: Behavior normal.        Thought Content: Thought content normal.      BP 102/70 (BP Location: Right Arm, Patient Position: Sitting)   Pulse 75   Temp 97.6 F (36.4 C)  (Temporal)   Ht 4' 8 (1.422 m)   Wt 117 lb (53.1 kg)   SpO2 99%   BMI 26.23 kg/m  Wt Readings from Last 3 Encounters:  05/29/24 117 lb (53.1 kg)  05/06/24 119 lb (54 kg)  03/06/24 119 lb (54 kg)       Assessment & Plan:   Problem List Items Addressed This Visit     Anxiety and depression   Reports mood is good.  Denies any concerns      Chronic left shoulder pain   This is a new problem brought to my attention today.  Ongoing for the past 4 years.  Limited range of motion on exam but otherwise unremarkable.  Referral to [facility] sports medicine for further evaluation and treatment.      Relevant Orders   Ambulatory referral to Sports Medicine   Impaired range of motion of left shoulder   Relevant Orders   Ambulatory referral to Sports Medicine   Mixed hyperlipidemia - Primary   She is fasting today.  Reports good compliance with her cholesterol medication since I last saw her in April.  Check lipids and follow up.       Relevant Orders   Lipid panel   Prediabetes   Continue working on a healthy diet and exercise.  Last A1c 6.3%.  Check A1c today and follow-up.      Relevant Orders   CBC   Comprehensive metabolic panel with GFR   Hemoglobin A1c   TSH   T4, free   Vitamin B12 deficiency   Reports  taking an over-the-counter B12 supplement.  Check vitamin B12 level and follow-up      Relevant Orders   Vitamin B12   Vitamin D  deficiency   Continue over-the-counter supplement unless vitamin D  has not improved.  Check vitamin D  level and follow-up      Relevant Orders   VITAMIN D  25 Hydroxy (Vit-D Deficiency, Fractures)   Other Visit Diagnoses       Screening mammogram for breast cancer       Relevant Orders   MM Digital Screening      Advised her that she is overdue for her mammogram.  She will call to schedule.  Reviewed recent note from neurologist.  She will be seeing neurosurgery soon.  I am having Emmalia Peffley maintain her acetaminophen, azelastine ,  olopatadine , fluticasone , zonisamide, ALPRAZolam , baclofen, montelukast , cetirizine , Vitamin D3, rosuvastatin , busPIRone , Cyanocobalamin  (VITAMIN B 12 PO), and sertraline .  No orders of the defined types were placed in this encounter.

## 2024-05-29 NOTE — Assessment & Plan Note (Signed)
 Continue over-the-counter supplement unless vitamin D has not improved.  Check vitamin D level and follow-up

## 2024-05-31 ENCOUNTER — Ambulatory Visit: Payer: Self-pay | Admitting: Family Medicine

## 2024-05-31 NOTE — Progress Notes (Signed)
 Please reach out to patient and see if she is taking the cholesterol medication rosuvastatin .  Has she missed any doses?  Her cholesterol was much better controlled in November and now it is significantly elevated.  If she is taking the medication every day then we need to increase her dose and ask her to cut back on foods high in fat, cholesterol and fried foods. Also her vitamin D  is low.  She should increase her current vitamin D  supplement by 1000 IUs daily.

## 2024-06-04 NOTE — Progress Notes (Unsigned)
   LILLETTE Ileana Collet, PhD, LAT, ATC acting as a scribe for Artist Lloyd, MD.  Erika Rogers is a 53 y.o. female who presents to [facility] at [facility] today for L shoulder pain ongoing for the past 4 years. She does not recall any injury. Pt locates pain to ***  Radiates: Aggravates: Treatments tried:  Pertinent review of systems: ***  Relevant historical information: ***   Exam:  There were no vitals taken for this visit. General: Well Developed, well nourished, and in no acute distress.   MSK: ***    Lab and Radiology Results No results found for this or any previous visit (from the past 72 hours). No results found.     Assessment and Plan: 53 y.o. female with ***   PDMP not reviewed this encounter. No orders of the defined types were placed in this encounter.  No orders of the defined types were placed in this encounter.    Discussed warning signs or symptoms. Please see discharge instructions. Patient expresses understanding.   ***

## 2024-06-04 NOTE — Procedures (Signed)
   [facility]  EEG (ELECTROENCEPHALOGRAM) REPORT   STUDY DATE: 05/20/24 PATIENT NAME: Erika Rogers DOB: 09/16/1971 MRN: [id number]  ORDERING CLINICIAN: Eduard Hanlon, MD   TECHNOLOGIST: MARLA suitor TECHNIQUE: Electroencephalogram was recorded utilizing standard 10-20 system of lead placement and reformatted into average and bipolar montages.  RECORDING TIME: 27 minutes ACTIVATION: hyperventilation and photic stimulation  CLINICAL INFORMATION: 53 year old female with memory loss spell.  FINDINGS: Posterior dominant background rhythms, which attenuate with eye opening, ranging 11-12 hertz and 50-0 microvolts. No focal, lateralizing, epileptiform activity or seizures are seen. Patient recorded in the awake and drowsy state. EKG channel shows regular rhythm of 65-70 beats per minute.   IMPRESSION:   Normal EEG in the awake and drowsy states.    INTERPRETING PHYSICIAN:  EDUARD FABIENE HANLON, MD Certified in Neurology, Neurophysiology and Neuroimaging  [facility] [address] [place], [place] [address] [phone]

## 2024-06-05 ENCOUNTER — Encounter: Payer: Self-pay | Admitting: Family Medicine

## 2024-06-05 ENCOUNTER — Ambulatory Visit: Payer: Self-pay | Admitting: Neurology

## 2024-06-05 ENCOUNTER — Ambulatory Visit (INDEPENDENT_AMBULATORY_CARE_PROVIDER_SITE_OTHER)

## 2024-06-05 ENCOUNTER — Other Ambulatory Visit: Payer: Self-pay

## 2024-06-05 ENCOUNTER — Ambulatory Visit: Admitting: Family Medicine

## 2024-06-05 VITALS — BP 110/78 | HR 78 | Ht <= 58 in | Wt 119.0 lb

## 2024-06-05 DIAGNOSIS — M25512 Pain in left shoulder: Secondary | ICD-10-CM | POA: Diagnosis not present

## 2024-06-05 DIAGNOSIS — M25612 Stiffness of left shoulder, not elsewhere classified: Secondary | ICD-10-CM

## 2024-06-05 DIAGNOSIS — G8929 Other chronic pain: Secondary | ICD-10-CM

## 2024-06-05 NOTE — Patient Instructions (Addendum)
 Thank you for coming in today.   I've referred you to Physical Therapy here, at this office.  You will hear from our office soon about scheduling, once we check with your insurance company.   You received an injection today. Seek immediate medical attention if the joint becomes red, extremely painful, or is oozing fluid.   Please get an Xray today before you leave   Check back with Dr. Leonce in 2 months  *Reminder: Dr. Joane will be out of the office starting August 1st, for about 6 weeks

## 2024-06-06 ENCOUNTER — Ambulatory Visit: Payer: Self-pay | Admitting: Family Medicine

## 2024-06-06 NOTE — Progress Notes (Signed)
Left shoulder x-ray shows mild arthritis

## 2024-06-15 ENCOUNTER — Other Ambulatory Visit: Payer: Self-pay | Admitting: Family Medicine

## 2024-06-15 DIAGNOSIS — F419 Anxiety disorder, unspecified: Secondary | ICD-10-CM

## 2024-06-19 ENCOUNTER — Encounter: Payer: Self-pay | Admitting: Physical Therapy

## 2024-06-19 ENCOUNTER — Ambulatory Visit (INDEPENDENT_AMBULATORY_CARE_PROVIDER_SITE_OTHER): Admitting: Physical Therapy

## 2024-06-19 ENCOUNTER — Other Ambulatory Visit: Payer: Self-pay

## 2024-06-19 DIAGNOSIS — M6281 Muscle weakness (generalized): Secondary | ICD-10-CM | POA: Diagnosis not present

## 2024-06-19 DIAGNOSIS — M25512 Pain in left shoulder: Secondary | ICD-10-CM

## 2024-06-19 DIAGNOSIS — G8929 Other chronic pain: Secondary | ICD-10-CM

## 2024-06-19 NOTE — Therapy (Signed)
 OUTPATIENT PHYSICAL THERAPY EVALUATION   Patient Name: Erika Rogers MRN: [id number] DOB:1970-12-13, 53 y.o., female Today's Date: 06/19/2024   END OF SESSION:  PT End of Session - 06/19/24 1634     Visit Number 1    Number of Visits 9    Date for PT Re-Evaluation 08/14/24    Authorization Type MCR    Progress Note Due on Visit 10    PT Start Time 1600    PT Stop Time 1635    PT Time Calculation (min) 35 min    Activity Tolerance Patient tolerated treatment well    Behavior During Therapy [facility] for tasks assessed/performed          Past Medical History:  Diagnosis Date   Allergy    ASCUS of cervix with negative high risk HPV 09/2017   Depression    Migraine    Past Surgical History:  Procedure Laterality Date   APPENDECTOMY     ESOPHAGOGASTRODUODENOSCOPY N/A 05/03/2015   Procedure: ESOPHAGOGASTRODUODENOSCOPY (EGD);  Surgeon: Gordy CHRISTELLA Starch, MD;  Location[facility] ENDOSCOPY;  Service: Endoscopy;  Laterality: N/A;   NASAL SINUS SURGERY     TONSILLECTOMY     Patient Active Problem List   Diagnosis Date Noted   Chronic left shoulder pain 05/29/2024   Impaired range of motion of left shoulder 05/29/2024   Vitamin B12 deficiency 05/29/2024   Vitamin D  deficiency 04/18/2023   Anemia 04/18/2023   Fatigue 04/18/2023   Migraine with aura and without status migrainosus, not intractable 02/21/2023   Environmental and seasonal allergies 10/18/2022   Seasonal allergies 03/15/2022   Anxiety and depression 03/15/2022   Prediabetes 03/15/2022   History of anemia 03/15/2022   Mixed hyperlipidemia 03/15/2022   Forgetfulness 11/23/2021   Positive ANA (antinuclear antibody) 11/02/2021   Dry mouth and eyes 11/02/2021   Microcytosis 05/15/2015   BRBPR (bright red blood per rectum) 05/03/2015   GERD 02/20/2009   DYSPHAGIA 02/20/2009    PCP: Lendia Boby CROME, NP-C  REFERRING PROVIDER: Joane Artist RAMAN, MD  REFERRING DIAG: Chronic left shoulder pain; Impaired range of motion of left  shoulder  THERAPY DIAG:  Chronic left shoulder pain  Muscle weakness (generalized)  Rationale for Evaluation and Treatment: Rehabilitation  ONSET DATE: Chronic   SUBJECTIVE:           SUBJECTIVE STATEMENT: Patient reports left shoulder pain that came on gradually about 2 months ago. She did have an injection that did help. She has difficulty getting her shirt on or reach up to do her hair, reaching behind her back. Certain movements can cause sharp pain. The pain can start in the shoulder and travel down the arm to the elbow.   Hand dominance: Right  PERTINENT HISTORY: See PMH above  PAIN:  Are you having pain? Yes:  NPRS scale: 0/10 pain at rest, 6-7/10 with shoulder movement Pain location: Left shoulder Pain description: Sharp Aggravating factors: Reaching up or behind back Relieving factors: Rest  PRECAUTIONS: None  RED FLAGS: None   WEIGHT BEARING RESTRICTIONS: No  FALLS:  Has patient fallen in last 6 months? No  PLOF: Independent  PATIENT GOALS: Improve left shoulder pain with dressing/grooming and household tasks   OBJECTIVE:  Note: Objective measures were completed at Evaluation unless otherwise noted. PATIENT SURVEYS:  Not assessed at eval  COGNITION: Overall cognitive status: Within functional limits for tasks assessed     SENSATION: WFL  POSTURE: Rounded shoulder posture  UPPER EXTREMITY ROM:     Cervical screen  negative for left shoulder/arm symptoms  Active ROM Right eval Left eval  Shoulder flexion 165 130  Shoulder extension    Shoulder abduction    Shoulder adduction    Shoulder internal rotation T7 L5  Shoulder external rotation T4 C7  Elbow flexion    Elbow extension    Wrist flexion    Wrist extension    Wrist ulnar deviation    Wrist radial deviation    Wrist pronation    Wrist supination    (Blank rows = not tested)  UPPER EXTREMITY MMT:  MMT Right eval Left eval  Shoulder flexion 5 4-  Shoulder extension     Shoulder abduction 5 4-  Shoulder adduction    Shoulder internal rotation 5 4  Shoulder external rotation 5 4-  Middle trapezius    Lower trapezius    Elbow flexion    Elbow extension    Wrist flexion    Wrist extension    Wrist ulnar deviation    Wrist radial deviation    Wrist pronation    Wrist supination    Grip strength (lbs) 38 18  (Blank rows = not tested)  SHOULDER SPECIAL TESTS: Not assessed  JOINT MOBILITY TESTING:  Hypomobility of left shoulder  PALPATION:  Tender to palpation generalized left shoulder and bicep region                                                                                                                             TREATMENT  [facility] Adult PT Treatment:                                                DATE: 06/19/2024 Left GHJ mobs primarily in inferior and posterior direction at various ranges of motion Left shoulder PROM Step back stretch at counter 5 x 5 sec Standing wall slide 5 x 5 sec  PATIENT EDUCATION: Education details: Exam findings, POC, HEP Person educated: Patient Education method: Programmer, multimedia, Demonstration, Tactile cues, Verbal cues, and Handouts Education comprehension: verbalized understanding, returned demonstration, verbal cues required, tactile cues required, and needs further education  HOME EXERCISE PROGRAM: Access Code: [email or web address]   ASSESSMENT: CLINICAL IMPRESSION: Patient is a 53 y.o. female who was seen today for physical therapy evaluation and treatment for chronic left shoulder pain. She does exhibit limited motion of the left shoulder with hypomobility noted, gross strength deficit of the left shoulder and grip strength. No pain reported with cervical motion or radicular symptoms noted.  OBJECTIVE IMPAIRMENTS: decreased activity tolerance, decreased ROM, decreased strength, postural dysfunction, and pain.   ACTIVITY LIMITATIONS: lifting, bathing, dressing, and reach over head  PARTICIPATION LIMITATIONS:  meal prep, cleaning, laundry, and shopping  PERSONAL FACTORS: Fitness, Past/current experiences, and Time since onset of injury/illness/exacerbation are also affecting patient's functional outcome.   REHAB POTENTIAL: Good  CLINICAL DECISION MAKING: Stable/uncomplicated  EVALUATION COMPLEXITY: Low   GOALS: Goals reviewed with patient? Yes  SHORT TERM GOALS: Target date: 07/17/2024  Patient will be I with initial HEP in order to progress with therapy. Baseline: HEP provided at eval Goal status: INITIAL  2.  Patient will report left shoulder pain with movement </= 3/10 in order to reduce functional limitations Baseline: 6-7/10 Goal status: INITIAL  LONG TERM GOALS: Target date: 08/14/2024  Patient will be I with final HEP to maintain progress from PT. Baseline: HEP provided at eval Goal status: INITIAL  2.  Patient will demonstrate left shoulder elevation AROM >/= 160 deg in order to improve overhead reach Baseline: 130 deg Goal status: INITIAL  3.  Patient will perform functional IR to T8 and functional ER to T2 to improve ability to perform self care tasks Baseline: functional IR to L5 and functional ER to C7 Goal status: INITIAL  4.  Patient will demonstrate left shoulder strength >/= 4+/5 MMT to improve ability to perform household tasks Baseline: grossly 4-/5 MMT Goal status: INITIAL   PLAN: PT FREQUENCY: 1x/week  PT DURATION: 8 weeks  PLANNED INTERVENTIONS: 97164- PT Re-evaluation, 97750- Physical Performance Testing, 97110-Therapeutic exercises, 97530- Therapeutic activity, 97112- Neuromuscular re-education, 97535- Self Care, [id number]- Manual therapy, Patient/Family education, Joint mobilization, Joint manipulation, Spinal manipulation, Spinal mobilization, Cryotherapy, and Moist heat  PLAN FOR NEXT SESSION: Review HEP and progress PRN, manual/mobs for left shoulder, left shoulder PROM>AAROM>AROM as tolerated, left shoulder/arm strengthening as tolerated   Elaine Daring, PT, DPT, LAT, ATC 06/19/24  4:42 PM Phone: [phone] Fax: [phone]

## 2024-06-19 NOTE — Patient Instructions (Signed)
 Access Code: [email or web address] URL: [email or web address] Date: 06/19/2024 Prepared by: Elaine Daring  Exercises - Step Back Shoulder Stretch with Chair  - 2-3 x daily - 10 reps - 5 seconds hold - Standing shoulder flexion wall slides  - 2-3 x daily - 10 reps - 5 seconds hold

## 2024-07-10 ENCOUNTER — Encounter: Payer: Self-pay | Admitting: Physical Therapy

## 2024-07-10 ENCOUNTER — Ambulatory Visit (INDEPENDENT_AMBULATORY_CARE_PROVIDER_SITE_OTHER): Admitting: Physical Therapy

## 2024-07-10 ENCOUNTER — Other Ambulatory Visit: Payer: Self-pay

## 2024-07-10 DIAGNOSIS — M6281 Muscle weakness (generalized): Secondary | ICD-10-CM

## 2024-07-10 DIAGNOSIS — G8929 Other chronic pain: Secondary | ICD-10-CM

## 2024-07-10 DIAGNOSIS — M25512 Pain in left shoulder: Secondary | ICD-10-CM | POA: Diagnosis not present

## 2024-07-10 NOTE — Therapy (Signed)
 OUTPATIENT PHYSICAL THERAPY TREATMENT   Patient Name: Erika Rogers MRN: [id number] DOB:1971-07-11, 53 y.o., female Today's Date: 07/10/2024   END OF SESSION:  PT End of Session - 07/10/24 0842     Visit Number 3    Number of Visits 9    Date for PT Re-Evaluation 08/14/24    Authorization Type MCR    Progress Note Due on Visit 10    PT Start Time [phone]    PT Stop Time 0920    PT Time Calculation (min) 38 min    Activity Tolerance Patient tolerated treatment well    Behavior During Therapy [facility] for tasks assessed/performed           Past Medical History:  Diagnosis Date   Allergy    ASCUS of cervix with negative high risk HPV 09/2017   Depression    Migraine    Past Surgical History:  Procedure Laterality Date   APPENDECTOMY     ESOPHAGOGASTRODUODENOSCOPY N/A 05/03/2015   Procedure: ESOPHAGOGASTRODUODENOSCOPY (EGD);  Surgeon: Gordy CHRISTELLA Starch, MD;  Location: [facility] ENDOSCOPY;  Service: Endoscopy;  Laterality: N/A;   NASAL SINUS SURGERY     TONSILLECTOMY     Patient Active Problem List   Diagnosis Date Noted   Chronic left shoulder pain 05/29/2024   Impaired range of motion of left shoulder 05/29/2024   Vitamin B12 deficiency 05/29/2024   Vitamin D  deficiency 04/18/2023   Anemia 04/18/2023   Fatigue 04/18/2023   Migraine with aura and without status migrainosus, not intractable 02/21/2023   Environmental and seasonal allergies 10/18/2022   Seasonal allergies 03/15/2022   Anxiety and depression 03/15/2022   Prediabetes 03/15/2022   History of anemia 03/15/2022   Mixed hyperlipidemia 03/15/2022   Forgetfulness 11/23/2021   Positive ANA (antinuclear antibody) 11/02/2021   Dry mouth and eyes 11/02/2021   Microcytosis 05/15/2015   BRBPR (bright red blood per rectum) 05/03/2015   GERD 02/20/2009   DYSPHAGIA 02/20/2009    PCP: Lendia Boby CROME, NP-C  REFERRING PROVIDER: Joane Artist RAMAN, MD  REFERRING DIAG: Chronic left shoulder pain; Impaired range of motion of left  shoulder  THERAPY DIAG:  Chronic left shoulder pain  Muscle weakness (generalized)  Rationale for Evaluation and Treatment: Rehabilitation  ONSET DATE: Chronic   SUBJECTIVE:           SUBJECTIVE STATEMENT: Patient reports her left shoulder is feeling better.  Eval: Patient reports left shoulder pain that came on gradually about 2 months ago. She did have an injection that did help. She has difficulty getting her shirt on or reach up to do her hair, reaching behind her back. Certain movements can cause sharp pain. The pain can start in the shoulder and travel down the arm to the elbow.   Hand dominance: Right  PERTINENT HISTORY: See PMH above  PAIN:  Are you having pain? Yes:  NPRS scale: 0/10 pain at rest, 6-7/10 with shoulder movement Pain location: Left shoulder Pain description: Sharp Aggravating factors: Reaching up or behind back Relieving factors: Rest  PRECAUTIONS: None  PATIENT GOALS: Improve left shoulder pain with dressing/grooming and household tasks   OBJECTIVE:  Note: Objective measures were completed at Evaluation unless otherwise noted. PATIENT SURVEYS:  Not assessed at eval  POSTURE: Rounded shoulder posture  UPPER EXTREMITY ROM:     Cervical screen negative for left shoulder/arm symptoms  Active ROM Right eval Left eval Left 07/10/2024  Shoulder flexion 165 130 143  Shoulder extension     Shoulder abduction  Shoulder adduction     Shoulder internal rotation T7 L5 L1  Shoulder external rotation T4 C7   Elbow flexion     Elbow extension     Wrist flexion     Wrist extension     Wrist ulnar deviation     Wrist radial deviation     Wrist pronation     Wrist supination     (Blank rows = not tested)  UPPER EXTREMITY MMT:  MMT Right eval Left eval  Shoulder flexion 5 4-  Shoulder extension    Shoulder abduction 5 4-  Shoulder adduction    Shoulder internal rotation 5 4  Shoulder external rotation 5 4-  Middle trapezius     Lower trapezius    Elbow flexion    Elbow extension    Wrist flexion    Wrist extension    Wrist ulnar deviation    Wrist radial deviation    Wrist pronation    Wrist supination    Grip strength (lbs) 38 18  (Blank rows = not tested)  SHOULDER SPECIAL TESTS: Not assessed  JOINT MOBILITY TESTING:  Hypomobility of left shoulder  PALPATION:  Tender to palpation generalized left shoulder and bicep region                                                                                                                             TREATMENT  [facility] Adult PT Treatment:                                                DATE: 07/10/2024 UBE L1 x 4 min (fwd/bwd) to improve endurance and workload capacity Left GHJ mobs primarily in inferior and posterior direction at various ranges of motion Left shoulder PROM Supine dowel shoulder flexion 2 x 10 x 5 sec Standing wall slide 10 x 5 sec Standing dowel shoulder extension 10 x 5 sec Standing IR behind back stretch with towel 10 x 5 sec Row with blue 2 x 10 Standing shoulder scaption with 2# 2 x 10  PATIENT EDUCATION: Education details: HEP update Person educated: Patient Education method: Explanation, Demonstration, Tactile cues, Verbal cues, and Handouts Education comprehension: verbalized understanding, returned demonstration, verbal cues required, tactile cues required, and needs further education  HOME EXERCISE PROGRAM: Access Code: [email or web address]   ASSESSMENT: CLINICAL IMPRESSION: Patient tolerated therapy well with no adverse effects. Therapy focused on improving her left shoulder mobility and strengthening with good tolerance. Continued with manual for left shoulder mobs and she was able to progress with some shoulder resistance exercises without increase in pain. Incorporated stretching to improve reach behind back. She does demonstrate an improvement in her left shoulder motion this visit. Updated her HEP for shoulder stretching and  postural strengthening with good tolerance. Patient would benefit from continued skilled PT to progress mobility  and strength in order to reduce pain and maximize functional ability.   Eval: Patient is a 53 y.o. female who was seen today for physical therapy evaluation and treatment for chronic left shoulder pain. She does exhibit limited motion of the left shoulder with hypomobility noted, gross strength deficit of the left shoulder and grip strength. No pain reported with cervical motion or radicular symptoms noted.  OBJECTIVE IMPAIRMENTS: decreased activity tolerance, decreased ROM, decreased strength, postural dysfunction, and pain.   ACTIVITY LIMITATIONS: lifting, bathing, dressing, and reach over head  PARTICIPATION LIMITATIONS: meal prep, cleaning, laundry, and shopping  PERSONAL FACTORS: Fitness, Past/current experiences, and Time since onset of injury/illness/exacerbation are also affecting patient's functional outcome.    GOALS: Goals reviewed with patient? Yes  SHORT TERM GOALS: Target date: 07/17/2024  Patient will be I with initial HEP in order to progress with therapy. Baseline: HEP provided at eval Goal status: INITIAL  2.  Patient will report left shoulder pain with movement </= 3/10 in order to reduce functional limitations Baseline: 6-7/10 Goal status: INITIAL  LONG TERM GOALS: Target date: 08/14/2024  Patient will be I with final HEP to maintain progress from PT. Baseline: HEP provided at eval Goal status: INITIAL  2.  Patient will demonstrate left shoulder elevation AROM >/= 160 deg in order to improve overhead reach Baseline: 130 deg Goal status: INITIAL  3.  Patient will perform functional IR to T8 and functional ER to T2 to improve ability to perform self care tasks Baseline: functional IR to L5 and functional ER to C7 Goal status: INITIAL  4.  Patient will demonstrate left shoulder strength >/= 4+/5 MMT to improve ability to perform household  tasks Baseline: grossly 4-/5 MMT Goal status: INITIAL   PLAN: PT FREQUENCY: 1x/week  PT DURATION: 8 weeks  PLANNED INTERVENTIONS: 97164- PT Re-evaluation, 97750- Physical Performance Testing, 97110-Therapeutic exercises, 97530- Therapeutic activity, 97112- Neuromuscular re-education, 97535- Self Care, [id number]- Manual therapy, Patient/Family education, Joint mobilization, Joint manipulation, Spinal manipulation, Spinal mobilization, Cryotherapy, and Moist heat  PLAN FOR NEXT SESSION: Review HEP and progress PRN, manual/mobs for left shoulder, left shoulder PROM>AAROM>AROM as tolerated, left shoulder/arm strengthening as tolerated   Elaine Daring, PT, DPT, LAT, ATC 07/10/24  9:24 AM Phone: [phone] Fax: [phone]

## 2024-07-10 NOTE — Patient Instructions (Signed)
 Access Code: [email or web address] URL: [email or web address] Date: 07/10/2024 Prepared by: Elaine Daring  Exercises - Step Back Shoulder Stretch with Chair  - 2-3 x daily - 10 reps - 5 seconds hold - Standing shoulder flexion wall slides  - 2-3 x daily - 10 reps - 5 seconds hold - Standing Shoulder Internal Rotation Stretch with Towel  - 2-3 x daily - 10 reps - 5 seconds hold - Standing Row with Anchored Resistance  - 1 x daily - 3 sets - 10 reps

## 2024-07-15 ENCOUNTER — Encounter: Attending: Psychology | Admitting: Psychology

## 2024-07-15 DIAGNOSIS — R4189 Other symptoms and signs involving cognitive functions and awareness: Secondary | ICD-10-CM | POA: Insufficient documentation

## 2024-07-15 DIAGNOSIS — G43109 Migraine with aura, not intractable, without status migrainosus: Secondary | ICD-10-CM | POA: Insufficient documentation

## 2024-07-15 DIAGNOSIS — F4310 Post-traumatic stress disorder, unspecified: Secondary | ICD-10-CM | POA: Diagnosis not present

## 2024-07-15 DIAGNOSIS — F419 Anxiety disorder, unspecified: Secondary | ICD-10-CM
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
## 2024-07-16 ENCOUNTER — Encounter: Payer: Self-pay | Admitting: Psychology

## 2024-07-17 ENCOUNTER — Ambulatory Visit (INDEPENDENT_AMBULATORY_CARE_PROVIDER_SITE_OTHER): Admitting: Physical Therapy

## 2024-07-17 ENCOUNTER — Other Ambulatory Visit: Payer: Self-pay

## 2024-07-17 ENCOUNTER — Encounter: Payer: Self-pay | Admitting: Physical Therapy

## 2024-07-17 DIAGNOSIS — M6281 Muscle weakness (generalized): Secondary | ICD-10-CM

## 2024-07-17 DIAGNOSIS — G8929 Other chronic pain: Secondary | ICD-10-CM | POA: Diagnosis not present

## 2024-07-17 DIAGNOSIS — M25512 Pain in left shoulder: Secondary | ICD-10-CM | POA: Diagnosis not present

## 2024-07-17 NOTE — Patient Instructions (Signed)
 Access Code: [email or web address] URL: [email or web address] Date: 07/17/2024 Prepared by: Elaine Daring  Exercises - Step Back Shoulder Stretch with Chair  - 2-3 x daily - 10 reps - 5 seconds hold - Standing shoulder flexion wall slides  - 2-3 x daily - 10 reps - 5 seconds hold - Standing Shoulder Internal Rotation Stretch with Towel  - 2-3 x daily - 10 reps - 5 seconds hold - Standing Row with Anchored Resistance  - 1 x daily - 3 sets - 10 reps - Standing Shoulder Flexion Full Range  - 1 x daily - 3 sets - 10 reps

## 2024-07-17 NOTE — Therapy (Signed)
 OUTPATIENT PHYSICAL THERAPY TREATMENT   Patient Name: Erika Rogers MRN: [id number] DOB:Jan 13, 1971, 53 y.o., female Today's Date: 07/17/2024   END OF SESSION:  PT End of Session - 07/17/24 0851     Visit Number 3    Number of Visits 9    Date for PT Re-Evaluation 08/14/24    Authorization Type MCR    Progress Note Due on Visit 10    PT Start Time 0845    PT Stop Time 0923    PT Time Calculation (min) 38 min    Activity Tolerance Patient tolerated treatment well    Behavior During Therapy [facility] for tasks assessed/performed            Past Medical History:  Diagnosis Date   Allergy    ASCUS of cervix with negative high risk HPV 09/2017   Depression    Migraine    Past Surgical History:  Procedure Laterality Date   APPENDECTOMY     ESOPHAGOGASTRODUODENOSCOPY N/A 05/03/2015   Procedure: ESOPHAGOGASTRODUODENOSCOPY (EGD);  Surgeon: Gordy CHRISTELLA Starch, MD;  Location: [facility] ENDOSCOPY;  Service: Endoscopy;  Laterality: N/A;   NASAL SINUS SURGERY     TONSILLECTOMY     Patient Active Problem List   Diagnosis Date Noted   Chronic left shoulder pain 05/29/2024   Impaired range of motion of left shoulder 05/29/2024   Vitamin B12 deficiency 05/29/2024   Vitamin D  deficiency 04/18/2023   Anemia 04/18/2023   Fatigue 04/18/2023   Migraine with aura and without status migrainosus, not intractable 02/21/2023   Environmental and seasonal allergies 10/18/2022   Seasonal allergies 03/15/2022   Anxiety and depression 03/15/2022   Prediabetes 03/15/2022   History of anemia 03/15/2022   Mixed hyperlipidemia 03/15/2022   Forgetfulness 11/23/2021   Positive ANA (antinuclear antibody) 11/02/2021   Dry mouth and eyes 11/02/2021   Microcytosis 05/15/2015   BRBPR (bright red blood per rectum) 05/03/2015   GERD 02/20/2009   DYSPHAGIA 02/20/2009    PCP: Lendia Boby CROME, NP-C  REFERRING PROVIDER: Joane Artist RAMAN, MD  REFERRING DIAG: Chronic left shoulder pain; Impaired range of motion of left  shoulder  THERAPY DIAG:  Chronic left shoulder pain  Muscle weakness (generalized)  Rationale for Evaluation and Treatment: Rehabilitation  ONSET DATE: Chronic   SUBJECTIVE:           SUBJECTIVE STATEMENT: Patient reports her left shoulder is feeling a lot better and she has been doing a lot at work and home.  Eval: Patient reports left shoulder pain that came on gradually about 2 months ago. She did have an injection that did help. She has difficulty getting her shirt on or reach up to do her hair, reaching behind her back. Certain movements can cause sharp pain. The pain can start in the shoulder and travel down the arm to the elbow.   Hand dominance: Right  PERTINENT HISTORY: See PMH above  PAIN:  Are you having pain? Yes:  NPRS scale: 0/10 pain at rest, 6-7/10 with shoulder movement Pain location: Left shoulder Pain description: Sharp Aggravating factors: Reaching up or behind back Relieving factors: Rest  PRECAUTIONS: None  PATIENT GOALS: Improve left shoulder pain with dressing/grooming and household tasks   OBJECTIVE:  Note: Objective measures were completed at Evaluation unless otherwise noted. PATIENT SURVEYS:  Not assessed at eval  POSTURE: Rounded shoulder posture  UPPER EXTREMITY ROM:     Cervical screen negative for left shoulder/arm symptoms  Active ROM Right eval Left eval Left 07/10/2024 Left 07/17/2024  Shoulder flexion 165 130 143 145  Shoulder extension      Shoulder abduction      Shoulder adduction      Shoulder internal rotation T7 L5 L1 T12  Shoulder external rotation T4 C7    Elbow flexion      Elbow extension      Wrist flexion      Wrist extension      Wrist ulnar deviation      Wrist radial deviation      Wrist pronation      Wrist supination      (Blank rows = not tested)  UPPER EXTREMITY MMT:  MMT Right eval Left eval  Shoulder flexion 5 4-  Shoulder extension    Shoulder abduction 5 4-  Shoulder adduction     Shoulder internal rotation 5 4  Shoulder external rotation 5 4-  Middle trapezius    Lower trapezius    Elbow flexion    Elbow extension    Wrist flexion    Wrist extension    Wrist ulnar deviation    Wrist radial deviation    Wrist pronation    Wrist supination    Grip strength (lbs) 38 18  (Blank rows = not tested)  SHOULDER SPECIAL TESTS: Not assessed  JOINT MOBILITY TESTING:  Hypomobility of left shoulder  PALPATION:  Tender to palpation generalized left shoulder and bicep region                                                                                                                             TREATMENT  [facility] Adult PT Treatment:                                                DATE: 07/17/2024 UBE L1 x 4 min (fwd/bwd) to improve endurance and workload capacity Left GHJ mobs primarily in inferior and posterior direction at various ranges of motion Left shoulder PROM Supine dowel shoulder flexion 2 x 10 x 5 sec Sidelying abduction with 2# 2 x 10 Sidelying ER with 2# 2 x 10 Standing wall slide 10 x 5 sec Standing shoulder scaption with 2# 3 x 10 Row with blue 3 x 15 Standing IR behind back stretch with towel 2 x 10 x 5 sec  PATIENT EDUCATION: Education details: HEP update Person educated: Patient Education method: Explanation, Demonstration, Tactile cues, Verbal cues, and Handouts Education comprehension: verbalized understanding, returned demonstration, verbal cues required, tactile cues required, and needs further education  HOME EXERCISE PROGRAM: Access Code: [email or web address]   ASSESSMENT: CLINICAL IMPRESSION: Patient tolerated therapy well with no adverse effects. Therapy continued to focus on improving her left shoulder mobility and strengthening with good tolerance. She continues to demonstrate improvement in her shoulder motion and is progressing well with weighted shoulder strengthening. Updated her HEP to progress  shoulder strengthening for home. Patient  would benefit from continued skilled PT to progress mobility and strength in order to reduce pain and maximize functional ability.   Eval: Patient is a 53 y.o. female who was seen today for physical therapy evaluation and treatment for chronic left shoulder pain. She does exhibit limited motion of the left shoulder with hypomobility noted, gross strength deficit of the left shoulder and grip strength. No pain reported with cervical motion or radicular symptoms noted.  OBJECTIVE IMPAIRMENTS: decreased activity tolerance, decreased ROM, decreased strength, postural dysfunction, and pain.   ACTIVITY LIMITATIONS: lifting, bathing, dressing, and reach over head  PARTICIPATION LIMITATIONS: meal prep, cleaning, laundry, and shopping  PERSONAL FACTORS: Fitness, Past/current experiences, and Time since onset of injury/illness/exacerbation are also affecting patient's functional outcome.    GOALS: Goals reviewed with patient? Yes  SHORT TERM GOALS: Target date: 07/17/2024  Patient will be I with initial HEP in order to progress with therapy. Baseline: HEP provided at eval Goal status: INITIAL  2.  Patient will report left shoulder pain with movement </= 3/10 in order to reduce functional limitations Baseline: 6-7/10 Goal status: INITIAL  LONG TERM GOALS: Target date: 08/14/2024  Patient will be I with final HEP to maintain progress from PT. Baseline: HEP provided at eval Goal status: INITIAL  2.  Patient will demonstrate left shoulder elevation AROM >/= 160 deg in order to improve overhead reach Baseline: 130 deg Goal status: INITIAL  3.  Patient will perform functional IR to T8 and functional ER to T2 to improve ability to perform self care tasks Baseline: functional IR to L5 and functional ER to C7 Goal status: INITIAL  4.  Patient will demonstrate left shoulder strength >/= 4+/5 MMT to improve ability to perform household tasks Baseline: grossly 4-/5 MMT Goal status:  INITIAL   PLAN: PT FREQUENCY: 1x/week  PT DURATION: 8 weeks  PLANNED INTERVENTIONS: 97164- PT Re-evaluation, 97750- Physical Performance Testing, 97110-Therapeutic exercises, 97530- Therapeutic activity, 97112- Neuromuscular re-education, 97535- Self Care, [id number]- Manual therapy, Patient/Family education, Joint mobilization, Joint manipulation, Spinal manipulation, Spinal mobilization, Cryotherapy, and Moist heat  PLAN FOR NEXT SESSION: Review HEP and progress PRN, manual/mobs for left shoulder, left shoulder PROM>AAROM>AROM as tolerated, left shoulder/arm strengthening as tolerated   Elaine Daring, PT, DPT, LAT, ATC 07/17/24  9:27 AM Phone: [phone] Fax: [phone]

## 2024-07-24 ENCOUNTER — Encounter: Payer: Self-pay | Admitting: Physical Therapy

## 2024-07-24 ENCOUNTER — Other Ambulatory Visit: Payer: Self-pay

## 2024-07-24 ENCOUNTER — Ambulatory Visit (INDEPENDENT_AMBULATORY_CARE_PROVIDER_SITE_OTHER): Admitting: Physical Therapy

## 2024-07-24 DIAGNOSIS — M25512 Pain in left shoulder: Secondary | ICD-10-CM

## 2024-07-24 DIAGNOSIS — G8929 Other chronic pain: Secondary | ICD-10-CM | POA: Diagnosis not present

## 2024-07-24 DIAGNOSIS — M6281 Muscle weakness (generalized): Secondary | ICD-10-CM

## 2024-07-24 NOTE — Patient Instructions (Signed)
 Access Code: [email or web address] URL: [email or web address] Date: 07/24/2024 Prepared by: Elaine Daring  Exercises - Step Back Shoulder Stretch with Chair  - 2-3 x daily - 10 reps - 5 seconds hold - Standing shoulder flexion wall slides  - 2-3 x daily - 10 reps - 5 seconds hold - Standing Shoulder Internal Rotation Stretch with Towel  - 2-3 x daily - 10 reps - 5 seconds hold - Standing Row with Anchored Resistance  - 1 x daily - 3 sets - 10 reps - Standing Shoulder Flexion Full Range  - 1 x daily - 3 sets - 10 reps - Shoulder Abduction with Dumbbells - Thumbs Up  - 1 x daily - 3 sets - 10 reps

## 2024-07-24 NOTE — Therapy (Signed)
 OUTPATIENT PHYSICAL THERAPY TREATMENT   Patient Name: Erika Rogers MRN: [id number] DOB:[birth year], 53 y.o., female Today's Date: 07/24/2024   END OF SESSION:  PT End of Session - 07/24/24 0842     Visit Number 4    Number of Visits 9    Date for PT Re-Evaluation 08/14/24    Authorization Type MCR    Progress Note Due on Visit 10    PT Start Time 0840    PT Stop Time 0919    PT Time Calculation (min) 39 min    Activity Tolerance Patient tolerated treatment well    Behavior During Therapy [facility] for tasks assessed/performed             Past Medical History:  Diagnosis Date   Allergy    ASCUS of cervix with negative high risk HPV 09/2017   Depression    Migraine    Past Surgical History:  Procedure Laterality Date   APPENDECTOMY     ESOPHAGOGASTRODUODENOSCOPY N/A 05/03/2015   Procedure: ESOPHAGOGASTRODUODENOSCOPY (EGD);  Surgeon: Gordy CHRISTELLA Starch, MD;  Location: [facility] ENDOSCOPY;  Service: Endoscopy;  Laterality: N/A;   NASAL SINUS SURGERY     TONSILLECTOMY     Patient Active Problem List   Diagnosis Date Noted   Chronic left shoulder pain 05/29/2024   Impaired range of motion of left shoulder 05/29/2024   Vitamin B12 deficiency 05/29/2024   Vitamin D  deficiency 04/18/2023   Anemia 04/18/2023   Fatigue 04/18/2023   Migraine with aura and without status migrainosus, not intractable 02/21/2023   Environmental and seasonal allergies 10/18/2022   Seasonal allergies 03/15/2022   Anxiety and depression 03/15/2022   Prediabetes 03/15/2022   History of anemia 03/15/2022   Mixed hyperlipidemia 03/15/2022   Forgetfulness 11/23/2021   Positive ANA (antinuclear antibody) 11/02/2021   Dry mouth and eyes 11/02/2021   Microcytosis 05/15/2015   BRBPR (bright red blood per rectum) 05/03/2015   GERD 02/20/2009   DYSPHAGIA 02/20/2009    PCP: Lendia Boby CROME, NP-C  REFERRING PROVIDER: Joane Artist RAMAN, MD  REFERRING DIAG: Chronic left shoulder pain; Impaired range of motion of left  shoulder  THERAPY DIAG:  Chronic left shoulder pain  Muscle weakness (generalized)  Rationale for Evaluation and Treatment: Rehabilitation  ONSET DATE: Chronic   SUBJECTIVE:           SUBJECTIVE STATEMENT: Patient reports her left shoulder is feeling good. She is consistent with her exercises at home.   Eval: Patient reports left shoulder pain that came on gradually about 2 months ago. She did have an injection that did help. She has difficulty getting her shirt on or reach up to do her hair, reaching behind her back. Certain movements can cause sharp pain. The pain can start in the shoulder and travel down the arm to the elbow.   Hand dominance: Right  PERTINENT HISTORY: See PMH above  PAIN:  Are you having pain? Yes:  NPRS scale: 0/10 pain at rest, 4/10 at worst Pain location: Left shoulder Pain description: Sharp Aggravating factors: Reaching up or behind back Relieving factors: Rest  PRECAUTIONS: None  PATIENT GOALS: Improve left shoulder pain with dressing/grooming and household tasks   OBJECTIVE:  Note: Objective measures were completed at Evaluation unless otherwise noted. PATIENT SURVEYS:  Not assessed at eval  POSTURE: Rounded shoulder posture  UPPER EXTREMITY ROM:     Cervical screen negative for left shoulder/arm symptoms  Active ROM Right eval Left eval Left 07/10/2024 Left 07/17/2024 Left 07/24/2024  Shoulder  flexion 165 130 143 145 145  Shoulder extension       Shoulder abduction       Shoulder adduction       Shoulder internal rotation T7 L5 L1 T12 T10  Shoulder external rotation T4 C7     Elbow flexion       Elbow extension       Wrist flexion       Wrist extension       Wrist ulnar deviation       Wrist radial deviation       Wrist pronation       Wrist supination       (Blank rows = not tested)  UPPER EXTREMITY MMT:  MMT Right eval Left eval  Shoulder flexion 5 4-  Shoulder extension    Shoulder abduction 5 4-  Shoulder  adduction    Shoulder internal rotation 5 4  Shoulder external rotation 5 4-  Middle trapezius    Lower trapezius    Elbow flexion    Elbow extension    Wrist flexion    Wrist extension    Wrist ulnar deviation    Wrist radial deviation    Wrist pronation    Wrist supination    Grip strength (lbs) 38 18  (Blank rows = not tested)  SHOULDER SPECIAL TESTS: Not assessed  JOINT MOBILITY TESTING:  Hypomobility of left shoulder  PALPATION:  Tender to palpation generalized left shoulder and bicep region                                                                                                                             TREATMENT  [facility] Adult PT Treatment:                                                DATE: 07/24/2024 UBE L3 x 5 min (fwd/bwd) to improve endurance and workload capacity Left GHJ mobs primarily in inferior and posterior direction at various ranges of motion Left shoulder PROM Sleeper stretch 3 x 30 sec on left Supine shoulder flexion with 2# 2 x 10 Sidelying abduction with 2# 2 x 10 Standing wall slide 10 x 5 sec Standing IR behind back stretch with towel 10 x 5 sec Standing shoulder flexion full range with 2# 2 x 10 Standing shoulder abduction with palm down to 90 deg with 2# 2 x 10 Row with blue 3 x 15 Shoulder ER with red 3 x 10  PATIENT EDUCATION: Education details: HEP update Person educated: Patient Education method: Programmer, multimedia, Demonstration, Actor cues, Verbal cues, and Handouts Education comprehension: verbalized understanding, returned demonstration, verbal cues required, tactile cues required, and needs further education  HOME EXERCISE PROGRAM: Access Code: [email or web address]   ASSESSMENT: CLINICAL IMPRESSION: Patient tolerated therapy well with no adverse effects. Therapy continued to focus  on improving her left shoulder mobility and strengthening with good tolerance. Continued with manual for left shoulder mobility and incorporated sleeper stretch  for posterior cuff stretching to improve her reach behind back. She does continue to exhibit improvement in her shoulder motion this visit. Progressed with her shoulder strengthening with good tolerance and she does exhibit weakness and muscular fatigue on the left. Updated her HEP to progress shoulder strengthening for home. Patient would benefit from continued skilled PT to progress mobility and strength in order to reduce pain and maximize functional ability.   Eval: Patient is a 53 y.o. female who was seen today for physical therapy evaluation and treatment for chronic left shoulder pain. She does exhibit limited motion of the left shoulder with hypomobility noted, gross strength deficit of the left shoulder and grip strength. No pain reported with cervical motion or radicular symptoms noted.  OBJECTIVE IMPAIRMENTS: decreased activity tolerance, decreased ROM, decreased strength, postural dysfunction, and pain.   ACTIVITY LIMITATIONS: lifting, bathing, dressing, and reach over head  PARTICIPATION LIMITATIONS: meal prep, cleaning, laundry, and shopping  PERSONAL FACTORS: Fitness, Past/current experiences, and Time since onset of injury/illness/exacerbation are also affecting patient's functional outcome.    GOALS: Goals reviewed with patient? Yes  SHORT TERM GOALS: Target date: 07/17/2024  Patient will be I with initial HEP in order to progress with therapy. Baseline: HEP provided at eval 07/24/2024: independent Goal status: MET  2.  Patient will report left shoulder pain with movement </= 3/10 in order to reduce functional limitations Baseline: 6-7/10 07/24/2024: 4/10 Goal status: ONGOING  LONG TERM GOALS: Target date: 08/14/2024  Patient will be I with final HEP to maintain progress from PT. Baseline: HEP provided at eval Goal status: INITIAL  2.  Patient will demonstrate left shoulder elevation AROM >/= 160 deg in order to improve overhead reach Baseline: 130 deg Goal status:  INITIAL  3.  Patient will perform functional IR to T8 and functional ER to T2 to improve ability to perform self care tasks Baseline: functional IR to L5 and functional ER to C7 Goal status: INITIAL  4.  Patient will demonstrate left shoulder strength >/= 4+/5 MMT to improve ability to perform household tasks Baseline: grossly 4-/5 MMT Goal status: INITIAL   PLAN: PT FREQUENCY: 1x/week  PT DURATION: 8 weeks  PLANNED INTERVENTIONS: 97164- PT Re-evaluation, 97750- Physical Performance Testing, 97110-Therapeutic exercises, 97530- Therapeutic activity, 97112- Neuromuscular re-education, 97535- Self Care, 02859- Manual therapy, Patient/Family education, Joint mobilization, Joint manipulation, Spinal manipulation, Spinal mobilization, Cryotherapy, and Moist heat  PLAN FOR NEXT SESSION: Review HEP and progress PRN, manual/mobs for left shoulder, left shoulder PROM>AAROM>AROM as tolerated, left shoulder/arm strengthening as tolerated   Elaine Daring, PT, DPT, LAT, ATC 07/24/24  9:22 AM Phone: [phone] Fax: [phone]

## 2024-07-29 ENCOUNTER — Encounter (HOSPITAL_BASED_OUTPATIENT_CLINIC_OR_DEPARTMENT_OTHER)

## 2024-07-29 DIAGNOSIS — F4310 Post-traumatic stress disorder, unspecified: Secondary | ICD-10-CM

## 2024-07-29 DIAGNOSIS — R4189 Other symptoms and signs involving cognitive functions and awareness: Secondary | ICD-10-CM | POA: Diagnosis not present

## 2024-07-29 NOTE — Progress Notes (Signed)
 Mental Status/Behavioral Observations (07/29/2024):  Orientation: The patient was not fully oriented. She was able to provide her name and date of birth but was unsure of her exact age (gave a range between [age]). She was able to provide the correct type of place and the city but not the name of the medical organization. She was oriented to most aspects of time with the exception of the exact day of the month. Sensory/Arousal: Hearing and vision were adequate for testing. The patient was alert. Appearance: Dress and hygiene were appropriate for the setting.  Speech/Language: In conversation, the patient's speech was prosodic, fluent, and well-articulated. The patient displayed no indications of word finding difficulties and no paraphasic errors were observed. There were no indications of problems with auditory-verbal comprehension based on the patient's behavior during testing.  Motor: The patient ambulated independently and without issue. No tremor was noted during testing.  Social Comportment: Social behavior was appropriate to the setting. Mood/Affect: Mood was neutral to anxious. Affect was consistent with mood.  Attention/Concentration:  The patient participated in testing as instructed. No frank attentional lapses were appreciated. Thought Process/Content: The patient's thought process was coherent, linear, goal directed. There were no indications of psychosis.  Ability to Participate in Testing / Additional Observations: Mild difficulties with understanding task instructions due to language barriers. An interpreter was present and collaborated with technician to ensure patient understanding of task instructions and testing procedures.   Neuropsychology Note Dajiah Gorin completed 150 minutes of neuropsychological testing with technician, Josue Ned, BA, under the supervision of Evalene Riff, PsyD., Clinical Neuropsychologist. The patient did not appear overtly distressed by the testing  session, per behavioral observation or via self-report to the technician. Rest breaks were offered.   Clinical Decision Making: In considering the patient's current level of functioning, level of presumed impairment, nature of symptoms, emotional and behavioral responses during clinical interview, level of literacy, and observed level of motivation/effort, a battery of tests was selected by Dr. Riff during initial consultation on 07/15/2024. This was communicated to the technician. Communication between the neuropsychologist and technician was ongoing throughout the testing session and changes were made as deemed necessary based on patient performance on testing, technician observations and additional pertinent factors such as those listed above.  Tests Administered: Brief Visuospatial Memory Test-Revised (BVMT-R) Controlled Oral Word Association Test (Animals only) Montreal Cognitive Assessment (MOCA) Repeatable Battery for the Assessment of Neuropsychological Status Update (RBANS) Trail Making Test (TMT; Part A & B) Wechsler Adult Intelligence Scale-Fourth Edition (WAIS-IV), select subtests Wechsler Memory Scale-Third Edition (WMS-III), select subtests  The Beck Depression Inventory-II (BDI-II) Beck Anxiety Inventory (BAI)  Results: Note: This summary of test scores accompanies the interpretive report and should not be interpreted by unqualified individuals or in isolation without reference to the report. Test scores are relative to age, gender, and educational history as available and appropriate. Measurement properties of test scores: IQ, Index, and Standard Scores (SS): Mean = 100; Standard Deviation = 15; Scaled Scores (ss): Mean = 10; Standard Deviation = 3; Z scores (Z): Mean = 0; Standard Deviation = 1; T scores (T); Mean = 50; Standard Deviation = 10  NOTE: See 08/01/2024 Encounter with Evalene Riff, PsyD for discussion of critical validity concerns.   ATTENTION AND WORKING MEMORY     Norm Score Percentile  Range  WAIS-IV          Digit Span  ss = 6 9 %ile Low Average   DSF  ss = 6 9 %ile Low  Average   Span:    5      DSB  ss = 7 16 %ile Low Average   Span:    3      DSS  ss = 7 16 %ile Low Average   Span:    4     WMS-III          Spatial Span  ss = 3 1 %ile Exceptionally Low   SSF  ss = 4 2 %ile Below Average   Span:    3      SSB  ss = 6 9 %ile Low Average   Span:    3     RBANS          Digit Span  ss = 6 9 %ile Low Average   PROCESSING SPEED    Norm Score Percentile  Range  WAIS-IV          Cancellation  ss = 8 25 %ile Average  RBANS          Coding  ss = 2 0.4 %ile Exceptionally Low   LANGUAGE    Norm Score Percentile  Range  NAB Naming  t = 56 73 %ile Average  COWAT          Animals  t = 29 2 %ile Exceptionally Low  RBANS           Semantic Fluency  ss = 3 1 %ile Exceptionally Low   EXECUTIVE FUNCTIONING    Norm Score Percentile  Range  DKEFS - Design Fluency          Condition 1  ss = 7 16 %ile Low Average   Condition II  ss = 8 25 %ile Average   Condition III  ss = 7 16 %ile Low Average  Trails A  t = 48 42 %ile Average  Trails B  t = 46 32 %ile Average   MEMORY    Norm Score Percentile  Range  BVMT-R          Trial 1  t = 32.0 4 %ile Below Average   Trial 2  t = 29 2 %ile Exceptionally Low   Trial 3  t = <20 <0.1 %ile Exceptionally Low   Total Recall  t = 22.0 0.2 %ile Exceptionally Low   Learning  t = 41.0 18 %ile Low Average   Delayed Recall  t = 21.0 0.1 %ile Exceptionally Low   % Retained    75 6-10 %ile Low to Below Average   Hits     >16 %ile WNL    False Alarms     <1 %ile Exceptionally Low   Recognition Discriminability     1-2 %ile Below to Exceptionally Low  RBANS          Immediate Memory Index  SS = 87 19 %ile Low Average   List Learning  ss = 10 50 %ile Average   Story Memory  ss = 6 9 %ile Low Average   List Recall     51st-75th  %ile Average to High Average   List Recognition     26th-50th  %ile Average   Story  Recall   = 5 5 %ile Below Average   VISUAL-SPATIAL    Norm Score Percentile  Range  WAIS-IV          Matrix Reasoning  ss =          6 9 %ile Low Average  RBANS  Visuospatial Index          RBANS Figure Copy  ss = 6 9 %ile Low Average   RBANS Line Orientation      17th-25th %ile   Low Average to Average   PERSONALITY AND BEHAVIORAL FUNCTIONING      Score/Interpretation  BDI Raw       14  BDI Severity       Mild.  BAI Raw       22  BAI Severity       Moderate.   MOCA            23/30           Feedback to Patient: Reagyn Tatem will return on 08/19/2024 for an interactive feedback session with Dr. Hayden at which time her test performances, clinical impressions and treatment recommendations will be reviewed in detail. The patient understands she can contact our office should she require our assistance before this time.  150 minutes spent face-to-face with patient administering standardized tests, 30 minutes spent scoring Radiographer, therapeutic). [CPT [id number], [id number]]  Full report to follow.

## 2024-07-31 ENCOUNTER — Encounter: Payer: Self-pay | Admitting: Physical Therapy

## 2024-07-31 ENCOUNTER — Ambulatory Visit (INDEPENDENT_AMBULATORY_CARE_PROVIDER_SITE_OTHER): Admitting: Physical Therapy

## 2024-07-31 ENCOUNTER — Other Ambulatory Visit: Payer: Self-pay

## 2024-07-31 DIAGNOSIS — M25512 Pain in left shoulder: Secondary | ICD-10-CM | POA: Diagnosis not present

## 2024-07-31 DIAGNOSIS — G8929 Other chronic pain: Secondary | ICD-10-CM | POA: Diagnosis not present

## 2024-07-31 DIAGNOSIS — M6281 Muscle weakness (generalized): Secondary | ICD-10-CM

## 2024-07-31 NOTE — Patient Instructions (Signed)
 Access Code: [email or web address] URL: [email or web address] Date: 07/31/2024 Prepared by: Elaine Daring  Exercises - Step Back Shoulder Stretch with Chair  - 2-3 x daily - 10 reps - 5 seconds hold - Standing shoulder flexion wall slides  - 2-3 x daily - 10 reps - 5 seconds hold - Standing Shoulder Internal Rotation Stretch with Towel  - 2-3 x daily - 10 reps - 5 seconds hold - Standing Row with Anchored Resistance  - 1 x daily - 3 sets - 10 reps - Standing Shoulder Flexion Full Range  - 1 x daily - 3 sets - 10 reps - Shoulder Abduction with Dumbbells - Thumbs Up  - 1 x daily - 3 sets - 10 reps

## 2024-07-31 NOTE — Therapy (Signed)
 OUTPATIENT PHYSICAL THERAPY TREATMENT  DISCHARGE   Patient Name: Erika Rogers MRN: [id number] DOB:11-27-1971, 53 y.o., female Today's Date: 07/31/2024   END OF SESSION:  PT End of Session - 07/31/24 0848     Visit Number 5    Number of Visits 9    Date for PT Re-Evaluation 08/14/24    Authorization Type MCR    Progress Note Due on Visit 10    PT Start Time 0846    PT Stop Time 0925    PT Time Calculation (min) 39 min    Activity Tolerance Patient tolerated treatment well    Behavior During Therapy [facility] for tasks assessed/performed              Past Medical History:  Diagnosis Date   Allergy    ASCUS of cervix with negative high risk HPV 09/2017   Depression    Migraine    Past Surgical History:  Procedure Laterality Date   APPENDECTOMY     ESOPHAGOGASTRODUODENOSCOPY N/A 05/03/2015   Procedure: ESOPHAGOGASTRODUODENOSCOPY (EGD);  Surgeon: Gordy CHRISTELLA Starch, MD;  Location: [facility] ENDOSCOPY;  Service: Endoscopy;  Laterality: N/A;   NASAL SINUS SURGERY     TONSILLECTOMY     Patient Active Problem List   Diagnosis Date Noted   Chronic left shoulder pain 05/29/2024   Impaired range of motion of left shoulder 05/29/2024   Vitamin B12 deficiency 05/29/2024   Vitamin D  deficiency 04/18/2023   Anemia 04/18/2023   Fatigue 04/18/2023   Migraine with aura and without status migrainosus, not intractable 02/21/2023   Environmental and seasonal allergies 10/18/2022   Seasonal allergies 03/15/2022   Anxiety and depression 03/15/2022   Prediabetes 03/15/2022   History of anemia 03/15/2022   Mixed hyperlipidemia 03/15/2022   Forgetfulness 11/23/2021   Positive ANA (antinuclear antibody) 11/02/2021   Dry mouth and eyes 11/02/2021   Microcytosis 05/15/2015   BRBPR (bright red blood per rectum) 05/03/2015   GERD 02/20/2009   DYSPHAGIA 02/20/2009    PCP: Lendia Boby CROME, NP-C  REFERRING PROVIDER: Joane Artist RAMAN, MD  REFERRING DIAG: Chronic left shoulder pain; Impaired range of  motion of left shoulder  THERAPY DIAG:  Chronic left shoulder pain  Muscle weakness (generalized)  Rationale for Evaluation and Treatment: Rehabilitation  ONSET DATE: Chronic   SUBJECTIVE:           SUBJECTIVE STATEMENT: Patient reports her left shoulder is feeling good. She is consistent with her exercises at home.   Eval: Patient reports left shoulder pain that came on gradually about 2 months ago. She did have an injection that did help. She has difficulty getting her shirt on or reach up to do her hair, reaching behind her back. Certain movements can cause sharp pain. The pain can start in the shoulder and travel down the arm to the elbow.   Hand dominance: Right  PERTINENT HISTORY: See PMH above  PAIN:  Are you having pain? Yes:  NPRS scale: 0/10 pain at rest, 4/10 at worst Pain location: Left shoulder Pain description: Sharp Aggravating factors: Reaching up or behind back Relieving factors: Rest  PRECAUTIONS: None  PATIENT GOALS: Improve left shoulder pain with dressing/grooming and household tasks   OBJECTIVE:  Note: Objective measures were completed at Evaluation unless otherwise noted. PATIENT SURVEYS:  Not assessed at eval  POSTURE: Rounded shoulder posture  UPPER EXTREMITY ROM:     Cervical screen negative for left shoulder/arm symptoms  Active ROM Right eval Left eval Left 07/10/2024 Left 07/17/2024 Left  07/24/2024 Left 07/31/2024  Shoulder flexion 165 130 143 145 145 150  Shoulder extension        Shoulder abduction        Shoulder adduction        Shoulder internal rotation T7 L5 L1 T12 T10 T10  Shoulder external rotation T4 C7    T2  Elbow flexion        Elbow extension        Wrist flexion        Wrist extension        Wrist ulnar deviation        Wrist radial deviation        Wrist pronation        Wrist supination        (Blank rows = not tested)  UPPER EXTREMITY MMT:  MMT Right eval Left eval Left 07/31/2024  Shoulder flexion 5  4- 5  Shoulder extension     Shoulder abduction 5 4- 4+  Shoulder adduction     Shoulder internal rotation 5 4 5   Shoulder external rotation 5 4- 4+  Middle trapezius     Lower trapezius     Elbow flexion     Elbow extension     Wrist flexion     Wrist extension     Wrist ulnar deviation     Wrist radial deviation     Wrist pronation     Wrist supination     Grip strength (lbs) 38 18   (Blank rows = not tested)  SHOULDER SPECIAL TESTS: Not assessed  JOINT MOBILITY TESTING:  Hypomobility of left shoulder  PALPATION:  Tender to palpation generalized left shoulder and bicep region                                                                                                                             TREATMENT  [facility] Adult PT Treatment:                                                DATE: 07/31/2024 UBE L3 x 5 min (fwd/bwd) to improve endurance and workload capacity Left GHJ mobs primarily in inferior and posterior direction at various ranges of motion Left shoulder PROM Sleeper stretch 3 x 30 sec on left Standing wall slide 10 x 5 sec Standing IR behind back stretch with towel 10 x 5 sec Standing shoulder flexion with 2# 3 x 10 Standing abduction with 2# 3 x 10 Row with blue 2 x 15 Extension with red 2 x 15 Shoulder ER/IR with red 3 x 10  PATIENT EDUCATION: Education details: HEP, POC discharge Person educated: Patient Education method: Explanation Education comprehension: Verbalized understanding  HOME EXERCISE PROGRAM: Access Code: [email or web address]   ASSESSMENT: CLINICAL IMPRESSION: Patient tolerated therapy well with no adverse effects. She has  made great improvement in her shoulder motion and strength with therapy. She denies pain with daily activities and is independent and consistent with her HEP. Therapy focused on continued progression of shoulder mobility and strength with good tolerance. She reports that she is pleased with her current functional level and is  ready to be discharged from PT, so patient will be formally discharged from PT at this time.   Eval: Patient is a 53 y.o. female who was seen today for physical therapy evaluation and treatment for chronic left shoulder pain. She does exhibit limited motion of the left shoulder with hypomobility noted, gross strength deficit of the left shoulder and grip strength. No pain reported with cervical motion or radicular symptoms noted.  OBJECTIVE IMPAIRMENTS: decreased activity tolerance, decreased ROM, decreased strength, postural dysfunction, and pain.   ACTIVITY LIMITATIONS: lifting, bathing, dressing, and reach over head  PARTICIPATION LIMITATIONS: meal prep, cleaning, laundry, and shopping  PERSONAL FACTORS: Fitness, Past/current experiences, and Time since onset of injury/illness/exacerbation are also affecting patient's functional outcome.    GOALS: Goals reviewed with patient? Yes  SHORT TERM GOALS: Target date: 07/17/2024  Patient will be I with initial HEP in order to progress with therapy. Baseline: HEP provided at eval 07/24/2024: independent Goal status: MET  2.  Patient will report left shoulder pain with movement </= 3/10 in order to reduce functional limitations Baseline: 6-7/10 07/24/2024: 4/10 07/31/2024: patient denies shoulder pain Goal status: MET  LONG TERM GOALS: Target date: 08/14/2024  Patient will be I with final HEP to maintain progress from PT. Baseline: HEP provided at eval 07/31/2024: independent with HEP Goal status: MET  2.  Patient will demonstrate left shoulder elevation AROM >/= 160 deg in order to improve overhead reach Baseline: 130 deg 07/31/2024: 150 deg Goal status: PARTIALLY MET  3.  Patient will perform functional IR to T8 and functional ER to T2 to improve ability to perform self care tasks Baseline: functional IR to L5 and functional ER to C7 07/31/2024: reach to T10 and T2 Goal status: PARTIALLY MET  4.  Patient will demonstrate left  shoulder strength >/= 4+/5 MMT to improve ability to perform household tasks Baseline: grossly 4-/5 MMT 07/31/2024: grossly 4+/5 MMT Goal status: MET   PLAN: PT FREQUENCY: 1x/week  PT DURATION: 8 weeks  PLANNED INTERVENTIONS: 97164- PT Re-evaluation, 97750- Physical Performance Testing, 97110-Therapeutic exercises, 97530- Therapeutic activity, 97112- Neuromuscular re-education, 97535- Self Care, 02859- Manual therapy, Patient/Family education, Joint mobilization, Joint manipulation, Spinal manipulation, Spinal mobilization, Cryotherapy, and Moist heat  PLAN FOR NEXT SESSION: NA - discharge   Elaine Daring, PT, DPT, LAT, ATC 07/31/24  9:28 AM Phone: [phone] Fax: [phone]   PHYSICAL THERAPY DISCHARGE SUMMARY  Visits from Start of Care: 5  Current functional level related to goals / functional outcomes: See above   Remaining deficits: See above   Education / Equipment: HEP   Patient agrees to discharge. Patient goals were partially met. Patient is being discharged due to being pleased with the current functional level.

## 2024-08-01 ENCOUNTER — Encounter (HOSPITAL_BASED_OUTPATIENT_CLINIC_OR_DEPARTMENT_OTHER): Admitting: Psychology

## 2024-08-01 DIAGNOSIS — F4310 Post-traumatic stress disorder, unspecified: Secondary | ICD-10-CM | POA: Diagnosis not present

## 2024-08-01 DIAGNOSIS — R4189 Other symptoms and signs involving cognitive functions and awareness: Secondary | ICD-10-CM | POA: Diagnosis not present

## 2024-08-06 NOTE — Progress Notes (Unsigned)
   LILLETTE Ileana Collet, PhD, LAT, ATC acting as a scribe for Artist Lloyd, MD.  Erika Rogers is a 53 y.o. female who presents to [facility] today for 37-month f/u L shoulder pain. Pt was last seen by Dr. Lloyd on 06/05/24 and was given a L GH steroid injection and was referred to PT. Completing 5 visits, d/c on 07/31/24.  Today, pt reports ***  Dx imaging: 06/05/24 L shoulder XR  Pertinent review of systems: ***  Relevant historical information: ***   Exam:  LMP 09/07/2020  General: Well Developed, well nourished, and in no acute distress.   MSK: ***    Lab and Radiology Results No results found for this or any previous visit (from the past 72 hours). No results found.     Assessment and Plan: 53 y.o. female with ***   PDMP not reviewed this encounter. No orders of the defined types were placed in this encounter.  No orders of the defined types were placed in this encounter.    Discussed warning signs or symptoms. Please see discharge instructions. Patient expresses understanding.   ***

## 2024-08-07 ENCOUNTER — Ambulatory Visit: Admitting: Sports Medicine

## 2024-08-07 ENCOUNTER — Encounter: Payer: Self-pay | Admitting: Family Medicine

## 2024-08-07 ENCOUNTER — Ambulatory Visit (INDEPENDENT_AMBULATORY_CARE_PROVIDER_SITE_OTHER): Admitting: Family Medicine

## 2024-08-07 VITALS — BP 118/80 | HR 72 | Ht <= 58 in | Wt 119.0 lb

## 2024-08-07 DIAGNOSIS — M25512 Pain in left shoulder: Secondary | ICD-10-CM | POA: Diagnosis not present

## 2024-08-07 DIAGNOSIS — G8929 Other chronic pain: Secondary | ICD-10-CM | POA: Diagnosis not present

## 2024-08-07 NOTE — Patient Instructions (Addendum)
 Thank you for coming in today.    Continue home exercise program provided by PT.   See you back as needed.

## 2024-08-15 ENCOUNTER — Ambulatory Visit: Admitting: Psychology

## 2024-08-19 ENCOUNTER — Encounter: Attending: Psychology | Admitting: Psychology

## 2024-08-19 DIAGNOSIS — R4189 Other symptoms and signs involving cognitive functions and awareness: Secondary | ICD-10-CM | POA: Diagnosis not present

## 2024-08-19 DIAGNOSIS — F4310 Post-traumatic stress disorder, unspecified: Secondary | ICD-10-CM | POA: Diagnosis not present

## 2024-08-19 NOTE — Progress Notes (Signed)
   NEUROPSYCHOLOGICAL EVALUATION [place]  Physical Medicine and Rehabilitation     Patient: Erika Rogers  MRN: [id number] DOB: February 26, 1971   Service Provider/Clinical Neuropsychologist: Evalene DOROTHA Riff, PsyD  Date of Service: 08/19/24 Start Time: 11 AM End Time: 12:15 PM  Location of Service:  [facility] Physical Medicine & Rehabilitation Department [place]. [facility] [address], [place] [place], [place] [address] Phone: [phone]   Billing Code/Service: [phone]    Individuals present: Patient, daughter, language interpreter, Provider Laurier DOROTHA Riff, PsyD)  Provider conducted the 60-minute interactive feedback appointment in-person with the patient and daughter (language interpreter also present).  The provider reviewed and discussed the results of neuropsychological evaluation. Follow-up interviewing was conducted as needed to refine interpretation of findings as needed. Review of results included overall findings, diagnosis, and treatment planning/recommendations that were derived from integration of patient data, interpretation of standardized rest results and clinical data, and clinical decision making, which are documented in the patient's electronic medical record with the full report (date listed below). A copy of the full report will also be mailed to the patient.   The patient expressed understanding of the information reviewed. The patient was provided opportunity to ask questions which were then answered by the provider. The provider worked collaboratively to tailor treatment recommendations to the patient when possible. The patient was informed they could reach out to the provider should additional questions related to the evaluation arise.    The final neuropsychological evaluation report, documented in the patient's chart DATE 08/01/24, was amended to reflect any additional information obtained during the feedback appointment  including treatment planning collaboration.              Evalene DOROTHA Riff, PsyD             Neuropsychologist    This report was generated using voice recognition software. While this document has been carefully reviewed, transcription errors may be present. I apologize in advance for any inconvenience. Please contact me if further clarification is needed.

## 2024-08-19 NOTE — Progress Notes (Signed)
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 recall, delayed free recall, and delayed recognition performances were all average. This test was administered in english as were the responses. The patient scored low average with immediate recall of a short story, and below average for delayed free recall. This test was administered via interpreter.    VISUAL-SPATIAL Raw Score   Norm Score Percentile  Range  WAIS-IV          Matrix Reasoning  ss =          6 9 %ile Low Average  RBANS Visuospatial Index          RBANS Figure Copy  ss = 6 9 %ile Low Average   RBANS Line Orientation      17th-25th %ile   Low Average to Average  The patient's performances on visual-spatial tasks were fairly consistent and within the low average range for abstract nonverbal reasoning, visual construction via figure copy, and judgement of line orientation.    PERSONALITY AND BEHAVIORAL FUNCTIONING Raw Score     Score/Interpretation  BDI Raw       14  BDI Severity       Mild.  BAI Raw       22  BAI Severity       Moderate.  The patient's self report endorsements on  rating scales showed mild depression related symptoms and moderately severe symptoms of anxiety.   (Note: Included for future comparison, if needed) MOCA - [place] Language Version  Total   Trails 1/1    Cube 0/1    Clock 3/3    Naming 3/3    Digits 2/2    Sustained: 0/1 (2 error)    Serial Subtraction 0/3 (declined to attempt)    Repetition 2/2    Letter Fluency 0/1    Abstraction 2/2    Recall: 4/5    Orientation: 6/6    (+1 for ed) 24   SUMMARY / CLINICAL IMPRESSIONS The patient was referred for neuropsychological evaluation by her neurologist, Dr. Margaret, with whom she established care on 05/06/24, for evaluation of memory loss.   The patient is a 53 year old woman who is a refugee from [place] and has been [place] in [place] and is a [place]  citizen. She has a medical history of mixed hyperlipidemia, vitamin B12 deficiency, vitamin D  deficiency (taking vitamin supplement for both), chronic left shoulder pain, migraine,  and cerebral cavernoma. Forgetfulness was reported to be a source of frustration and stress, which result in headaches.  Records indicated increased stress and anxiety over the last 1-2 years, exacerbated by events in the news, and fears she would be forced to return to [place] and be killed as a result. She was reported to have episodic difficulty with attention/focus, and short term memory loss. Score on MoCA at that visit was 22 out of 30. Dr. Margaret indicated in visit notes that he reviewed 2022 MRI of the brain and agreed with findings; 1. Small (5 mm) lesion in the high left parietal lobe with associated susceptibility artifact, peripheral T2 hypointensity, and adjacent developmental venous anomaly. 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 No evidence of surrounding edema, mass effect or acute hemorrhage. 2. Otherwise, no acute abnormality. Patient had been referred to neurosurgery for follow up after initial imaging  in 2022. A new referral was placed in early 2025 to neurosurgery. Notes from her 05/06/24 visit with Dr. Margaret indicated the patient was referred for neuropsychological evaluation, EEG, and recommended for follow up with PCP for B12 injections as lab work was in the low-normal range. Results from EEG were reported to be normal. Upon interview, the provider discussed with the patient, and patient's daughter, the evaluation process. The provider agreed with her daughter that difficulties with stress and anxiety could underlie the patient's difficulties, as was noted by the referring provider. They agreed to proceed with testing out of an abundance of caution.   The patient described some difficulties primarily involving short term memory, attention, and language difficulties. The patient's daughter observed a relationship between migraines and cognitive symptoms. She describes some minor difficulties with instrumental activities of daily living, but she is also caregiving for her brother in addition to managing her own responsibilities.  The patient describes exacerbation of anxiety and there are indications of active PTSD symptomology. Cognitive symptoms may indeed be secondary to anxiety/PTSD and migraine, but the patient also has a history of head injuries during childhood and possible abnormal imaging findings (may be artifact).   As noted, there are considerable challenges to test validity and findings are subsequently interpreted with considerable caution. Demographically appropriate normative data is not available and the impact of cultural background and language, in addition to non-standardized administration due to use of interpreter, limits the reliability of normed scores and use for interpretation of cognitive  abilities. This context is likely to increase risk of under-estimating the patient's cognitive abilities if interpreting norm scores in isolation. That being said, scores which are average by  age are even more reassuring given this context. On testing, the patient scored consistently in the average range on the word-list memory test. This is reassuring as it suggests the underlying neurocognitive systems needed for learning, retaining, and retrieving new information is intact overall. The patient also performed fairly well on measures of executive functioning, showing no clear deficits in higher-order cognitive abilities. Executive functioning and memory require adequately intact cognitive proficiency abilities (processing speed, attention and concentration), and thus scores on memory and executive functioning tests are reassuring with respect to attention and processing speed, albeit indirectly. The patient's low performances (by [place] age- and age+education-based norms) must be interpreted with considerable caution. The impact of language and culture cannot be adequately ruled out and could easily account for the low scores. Even visual-spatial task performance, per research, show notable variability between cultural groups.   Significant caution is required in deriving conclusions based on test performances due to validity concerns. The possibility of neurological cause underlying reported cognitive difficulties cannot be ruled out entirely, although findings provide cautious reassurance. Difficulties with psychiatric symptoms, stressors, environmental demands, sleep difficulties, and frequent migraines can readily account for the patient's subjective cognitive difficulties. As discussed during feedback, changing to a new mental health therapist that feels like a better fit is recommended as improvements in mental health could have significant benefits to cognitive symptoms indirectly. If cognitive difficulties persist despite improvements in mental health, or if there are significant and unexpected declines, a re-evaluation would be appropriate. The current data-set provides a baseline that future  testing can be compared to, and used to assess for indications of decline. This will help address concerns about test validity to some degree, as a significant issue in the current evaluation is the lack of a valid comparison point. If symptoms resolve in the future, a re-evaluation is not necessary unless felt beneficial by the patient and referring provider.     Diagnosis: Cognitive changes  Anxiety disorder, unspecified Provisional diagnosis of Post-Traumatic Stress disorder  Recommendations:  Follow up with the referring provider as planned.  I placed a referral to behavioral health for individual therapy. There is likely to be a significant wait-list for this and (with family help) looking into community-based mental health providers may provide more timely care. As discussed www.psychologytoday.com has a large database of providers which can be searched and filtered by multiple criteria. Please let me know if you have any questions about this.  I encourage the patient to continue to monitor and address any chronic health conditions. Taking supplements (e.g., B12 etc.) according to the recommendations of your medical providers is strongly encouraged as poor management can have a negative impact on cognitive health.  I've included a number of handouts with cognitive strategies/tips which may be beneficial to the patient. As discussed during feedback, using a calendar is highly recommended and reasonably in tracking medical appointments, particularly when doing so for more than one person.  As stated, if cognitive difficulties persist or worsen, particularly if anxiety/stress and PTSD related symptoms have improved, a re-evaluation can be conducted and compared to the current evaluation. 12-Months is typically the earliest a re-evaluation would be conducted, but if there are marked / unexpected declines, a sooner date could be scheduled.  Please feel free to reach out with any questions that may  arise after receipt  of this report.            Evalene DOROTHA Riff, PsyD             Neuropsychologist   This report was generated using voice recognition software. While this document has been carefully reviewed, transcription errors may be present. I apologize in advance for any inconvenience. Please contact me if further clarification is needed.

## 2024-09-23 ENCOUNTER — Other Ambulatory Visit: Payer: Self-pay | Admitting: Family Medicine

## 2024-09-23 DIAGNOSIS — F419 Anxiety disorder, unspecified: Secondary | ICD-10-CM

## 2024-10-03 ENCOUNTER — Ambulatory Visit (INDEPENDENT_AMBULATORY_CARE_PROVIDER_SITE_OTHER): Payer: Self-pay | Admitting: Clinical

## 2024-10-03 DIAGNOSIS — F4310 Post-traumatic stress disorder, unspecified: Secondary | ICD-10-CM

## 2024-10-03 DIAGNOSIS — F331 Major depressive disorder, recurrent, moderate: Secondary | ICD-10-CM

## 2024-10-03 DIAGNOSIS — F411 Generalized anxiety disorder: Secondary | ICD-10-CM | POA: Diagnosis not present

## 2024-10-03 DIAGNOSIS — F410 Panic disorder [episodic paroxysmal anxiety] without agoraphobia: Secondary | ICD-10-CM

## 2024-10-05 ENCOUNTER — Encounter (HOSPITAL_COMMUNITY): Payer: Self-pay

## 2024-10-05 ENCOUNTER — Encounter (HOSPITAL_COMMUNITY): Payer: Self-pay | Admitting: Clinical
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
## 2024-11-22 ENCOUNTER — Ambulatory Visit: Payer: Medicare Other

## 2024-11-22 VITALS — HR 87 | Ht <= 58 in | Wt 126.0 lb

## 2024-11-22 DIAGNOSIS — Z0000 Encounter for general adult medical examination without abnormal findings: Secondary | ICD-10-CM

## 2024-11-22 DIAGNOSIS — Z23 Encounter for immunization: Secondary | ICD-10-CM

## 2024-11-22 NOTE — Patient Instructions (Signed)
 Erika Rogers,  Thank you for taking the time for your Medicare Wellness Visit. I appreciate your continued commitment to your health goals. Please review the care plan we discussed, and feel free to reach out if I can assist you further.  Please note that Annual Wellness Visits do not include a physical exam. Some assessments may be limited, especially if the visit was conducted virtually. If needed, we may recommend an in-person follow-up with your provider.  Ongoing Care Seeing your primary care provider every 3 to 6 months helps us  monitor your health and provide consistent, personalized care. You are due for a pneumonia vaccine and a shingles vaccine.  These can be given at your pharmacy.  You are also due for a Hep B vaccine.  Remember to schedule a office visit with your provider.  Referrals If a referral was made during today's visit and you haven't received any updates within two weeks, please contact the referred provider directly to check on the status.  Recommended Screenings:  Health Maintenance  Topic Date Due   HIV Screening  Never done   Hepatitis B Vaccine (1 of 3 - 19+ 3-dose series) Never done   Zoster (Shingles) Vaccine (1 of 2) Never done   Pneumococcal Vaccine for age over 2 (1 of 1 - PCV) Never done   Breast Cancer Screening  04/19/2024   Flu Shot  07/05/2024   COVID-19 Vaccine (4 - 2025-26 season) 08/05/2024   Medicare Annual Wellness Visit  11/16/2024   Pap with HPV screening  01/28/2025   Cologuard (Stool DNA test)  05/28/2025   DTaP/Tdap/Td vaccine (4 - Td or Tdap) 03/01/2026   Hepatitis C Screening  Completed   HPV Vaccine  Aged Out   Meningitis B Vaccine  Aged Out       06/19/2024    4:34 PM  Advanced Directives  Does Patient Have a Medical Advance Directive? No  Would patient like information on creating a medical advance directive? No - Patient declined    Vision: Annual vision screenings are recommended for early detection of glaucoma, cataracts,  and diabetic retinopathy. These exams can also reveal signs of chronic conditions such as diabetes and high blood pressure.  Dental: Annual dental screenings help detect early signs of oral cancer, gum disease, and other conditions linked to overall health, including heart disease and diabetes.  Please see the attached documents for additional preventive care recommendations.

## 2024-11-22 NOTE — Progress Notes (Signed)
 "   Chief Complaint  Patient presents with   Medicare Wellness     Subjective:   Erika Rogers is a 53 y.o. female who presents for a Medicare Annual Wellness Visit.  Visit info / Clinical Intake: Medicare Wellness Visit Type:: Subsequent Annual Wellness Visit Persons participating in visit and providing information:: patient Medicare Wellness Visit Mode:: In-person (required for WTM) Interpreter Needed?: Yes Interpreter Agency: Interpreter [employer] Patient Declined Interpreter : No Interpretation services provided by: Telephone interpreter observed visit Pre-visit prep was completed: yes AWV questionnaire completed by patient prior to visit?: no Living arrangements:: with family/others Patient's Overall Health Status Rating: good Typical amount of pain: none Does pain affect daily life?: no Are you currently prescribed opioids?: no  Dietary Habits and Nutritional Risks How many meals a day?: 3 Eats fruit and vegetables daily?: yes Most meals are obtained by: preparing own meals In the last 2 weeks, have you had any of the following?: -- (car sick) Diabetic:: no  Functional Status Activities of Daily Living (to include ambulation/medication): Independent Medication Administration: Independent Home Management (perform basic housework or laundry): Independent Manage your own finances?: yes Primary transportation is: driving Concerns about vision?: (!) yes (wears reading glasses/does not have an eye care provider) Concerns about hearing?: no  Fall Screening Falls in the past year?: 0 Number of falls in past year: 0 Was there an injury with Fall?: 0 Fall Risk Category Calculator: 0 Patient Fall Risk Level: Low Fall Risk  Fall Risk Patient at Risk for Falls Due to: No Fall Risks Fall risk Follow up: Falls evaluation completed; Falls prevention discussed  Home and Transportation Safety: All rugs have non-skid backing?: N/A, no rugs All stairs or steps have railings?:  yes Grab bars in the bathtub or shower?: yes Have non-skid surface in bathtub or shower?: yes Good home lighting?: yes Regular seat belt use?: yes Hospital stays in the last year:: no  Cognitive Assessment Difficulty concentrating, remembering, or making decisions? : yes (remembering) Will 6CIT or Mini Cog be Completed: no (due to language barrier) 6CIT or Mini Cog Declined: patient alert, oriented, able to answer questions appropriately and recall recent events  Advance Directives (For Healthcare) Does Patient Have a Medical Advance Directive?: No Would patient like information on creating a medical advance directive?: No - Patient declined  Reviewed/Updated  Reviewed/Updated: Reviewed All (Medical, Surgical, Family, Medications, Allergies, Care Teams, Patient Goals)    Allergies (verified) Other, Bovine (beef) protein-containing drug products, Shellfish allergy, and Amoxicillin   Current Medications (verified) Outpatient Encounter Medications as of 11/22/2024  Medication Sig   acetaminophen (TYLENOL) 325 MG tablet Take 650 mg by mouth every 6 (six) hours as needed.   azelastine  (ASTELIN ) 0.1 % nasal spray Place 1 spray into both nostrils 2 (two) times daily. Use in each nostril as directed   baclofen (LIORESAL) 10 MG tablet Take 10 mg by mouth 2 (two) times daily. Limit to 1 to 2 treatments per week   cetirizine  (ZYRTEC ) 10 MG tablet Take 1 tablet (10 mg total) by mouth daily.   Cholecalciferol (VITAMIN D3) 250 MCG (10000 UT) capsule Take 10,000 Units by mouth daily.   Cyanocobalamin  (VITAMIN B 12 PO) Take 1,000 mcg by mouth daily.   fluticasone  (FLONASE ) 50 MCG/ACT nasal spray SPRAY 2 SPRAYS INTO EACH NOSTRIL EVERY DAY   montelukast  (SINGULAIR ) 10 MG tablet TAKE 1 TABLET BY MOUTH AT BEDTIME   rosuvastatin  (CRESTOR ) 5 MG tablet Take 1 tablet (5 mg total) by mouth daily.  sertraline  (ZOLOFT ) 25 MG tablet Take 25 mg by mouth daily.   zonisamide (ZONEGRAN) 100 MG capsule Take 100  mg by mouth daily.   ALPRAZolam  (XANAX  XR) 1 MG 24 hr tablet Take 1 mg by mouth at bedtime. (Patient not taking: Reported on 11/22/2024)   busPIRone  (BUSPAR ) 5 MG tablet TAKE 1 TABLET BY MOUTH 2 TIMES A DAY (Patient not taking: Reported on 11/22/2024)   olopatadine  (PATANOL) 0.1 % ophthalmic solution Place 1 drop into both eyes daily as needed for allergies. (Patient not taking: Reported on 11/22/2024)   No facility-administered encounter medications on file as of 11/22/2024.    History: Past Medical History:  Diagnosis Date   Allergy    ASCUS of cervix with negative high risk HPV 09/2017   Depression    Migraine    Past Surgical History:  Procedure Laterality Date   APPENDECTOMY     ESOPHAGOGASTRODUODENOSCOPY N/A 05/03/2015   Procedure: ESOPHAGOGASTRODUODENOSCOPY (EGD);  Surgeon: Gordy CHRISTELLA Starch, MD;  Location: [facility] ENDOSCOPY;  Service: Endoscopy;  Laterality: N/A;   NASAL SINUS SURGERY     TONSILLECTOMY     Family History  Problem Relation Age of Onset   Diabetes Brother    Mental illness Brother    Social History   Occupational History   Occupation: does not work  Tobacco Use   Smoking status: Never   Smokeless tobacco: Never  Vaping Use   Vaping status: Never Used  Substance and Sexual Activity   Alcohol use: No    Alcohol/week: 0.0 standard drinks of alcohol   Drug use: No   Sexual activity: Yes    Birth control/protection: None    Comment: 1st intercourse 51 yo-1 partner   Tobacco Counseling Counseling given: Not Answered  SDOH Screenings   Food Insecurity: No Food Insecurity (11/17/2023)  Housing: Unknown (11/17/2023)  Transportation Needs: No Transportation Needs (11/17/2023)  Utilities: Not At Risk (11/17/2023)  Alcohol Screen: Low Risk (11/17/2023)  Depression (PHQ2-9): Low Risk (11/22/2024)  Recent Concern: Depression (PHQ2-9) - High Risk (10/05/2024)  Financial Resource Strain: Low Risk (11/17/2023)  Physical Activity: Insufficiently Active (11/22/2024)   Social Connections: Socially Integrated (11/22/2024)  Stress: No Stress Concern Present (11/22/2024)  Tobacco Use: Low Risk (11/22/2024)  Health Literacy: Adequate Health Literacy (11/22/2024)   See flowsheets for full screening details  Depression Screen PHQ 2 & 9 Depression Scale- Over the past 2 weeks, how often have you been bothered by any of the following problems? Little interest or pleasure in doing things: 1 Feeling down, depressed, or hopeless (PHQ Adolescent also includes...irritable): 0 PHQ-2 Total Score: 1 Trouble falling or staying asleep, or sleeping too much: 0 Feeling tired or having little energy: 0 Poor appetite or overeating (PHQ Adolescent also includes...weight loss): 0 Feeling bad about yourself - or that you are a failure or have let yourself or your family down: 0 Trouble concentrating on things, such as reading the newspaper or watching television (PHQ Adolescent also includes...like school work): 1 Moving or speaking so slowly that other people could have noticed. Or the opposite - being so fidgety or restless that you have been moving around a lot more than usual: 0 Thoughts that you would be better off dead, or of hurting yourself in some way: 0 PHQ-9 Total Score: 2 If you checked off any problems, how difficult have these problems made it for you to do your work, take care of things at home, or get along with other people?: Not difficult at all  Depression  Treatment Depression Interventions/Treatment : [id number] Score <4 Follow-up Not Indicated; Medication     Goals Addressed   None          Objective:    Today's Vitals   11/22/24 0924 11/22/24 1026  Pulse:  87  SpO2:  98%  Weight:  126 lb (57.2 kg)  Height: 4' 8 (1.422 m) 4' 8.75 (1.441 m)   Body mass index is 27.51 kg/m.  Hearing/Vision screen Hearing Screening - Comments:: Denies hearing difficulties   Vision Screening - Comments:: Per pt-has some issues Immunizations and Health  Maintenance Health Maintenance  Topic Date Due   HIV Screening  Never done   Hepatitis B Vaccines 19-59 Average Risk (1 of 3 - 19+ 3-dose series) Never done   Zoster Vaccines- Shingrix (1 of 2) Never done   Pneumococcal Vaccine: 50+ Years (1 of 1 - PCV) Never done   Mammogram  04/19/2024   COVID-19 Vaccine (4 - 2025-26 season) 08/05/2024   Cervical Cancer Screening (HPV/Pap Cotest)  01/28/2025   Fecal DNA (Cologuard)  05/28/2025   Medicare Annual Wellness (AWV)  11/22/2025   DTaP/Tdap/Td (4 - Td or Tdap) 03/01/2026   Influenza Vaccine  Completed   Hepatitis C Screening  Completed   HPV VACCINES  Aged Out   Meningococcal B Vaccine  Aged Out        Assessment/Plan:  This is a routine wellness examination for Erika Rogers.  Patient Care Team: Lendia Boby CROME, NP-C as PCP - General (Family Medicine) Oneita Na, MD as Referring Physician (Specialist)  I have personally reviewed and noted the following in the patients chart:   Medical and social history Use of alcohol, tobacco or illicit drugs  Current medications and supplements including opioid prescriptions. Functional ability and status Nutritional status Physical activity Advanced directives List of other physicians Hospitalizations, surgeries, and ER visits in previous 12 months Vitals Screenings to include cognitive, depression, and falls Referrals and appointments  Orders Placed This Encounter  Procedures   Flu vaccine trivalent PF, 6mos and older(Flulaval,Afluria,Fluarix,Fluzone)   In addition, I have reviewed and discussed with patient certain preventive protocols, quality metrics, and best practice recommendations. A written personalized care plan for preventive services as well as general preventive health recommendations were provided to patient.   Erika Rogers, CMA   11/22/2024   Return in 1 year (on 11/22/2025).  After Visit Summary: (In Person-Printed) AVS printed and given to the patient  Nurse  Notes: Patient is due for a shingrix vaccine,  pneumonia vaccine and a Hep B vaccine.  Patient had no other concerns to address today. "

## 2024-12-11 ENCOUNTER — Ambulatory Visit: Payer: Self-pay | Admitting: Family Medicine

## 2024-12-11 ENCOUNTER — Encounter: Payer: Self-pay | Admitting: Family Medicine

## 2024-12-11 ENCOUNTER — Ambulatory Visit (INDEPENDENT_AMBULATORY_CARE_PROVIDER_SITE_OTHER): Admitting: Family Medicine

## 2024-12-11 VITALS — BP 118/74 | HR 77 | Temp 98.3°F | Ht <= 58 in | Wt 122.0 lb

## 2024-12-11 DIAGNOSIS — E538 Deficiency of other specified B group vitamins: Secondary | ICD-10-CM | POA: Diagnosis not present

## 2024-12-11 DIAGNOSIS — R7303 Prediabetes: Secondary | ICD-10-CM | POA: Diagnosis not present

## 2024-12-11 DIAGNOSIS — J3089 Other allergic rhinitis: Secondary | ICD-10-CM | POA: Diagnosis not present

## 2024-12-11 DIAGNOSIS — E782 Mixed hyperlipidemia: Secondary | ICD-10-CM | POA: Diagnosis not present

## 2024-12-11 DIAGNOSIS — E559 Vitamin D deficiency, unspecified: Secondary | ICD-10-CM

## 2024-12-11 LAB — CBC WITH DIFFERENTIAL/PLATELET
Basophils Absolute: 0 K/uL (ref 0.0–0.1)
Basophils Relative: 0.9 % (ref 0.0–3.0)
Eosinophils Absolute: 0.2 K/uL (ref 0.0–0.7)
Eosinophils Relative: 3.6 % (ref 0.0–5.0)
HCT: 38.9 % (ref 36.0–46.0)
Hemoglobin: 12.4 g/dL (ref 12.0–15.0)
Lymphocytes Relative: 33.3 % (ref 12.0–46.0)
Lymphs Abs: 1.8 K/uL (ref 0.7–4.0)
MCHC: 32 g/dL (ref 30.0–36.0)
MCV: 77.9 fl — ABNORMAL LOW (ref 78.0–100.0)
Monocytes Absolute: 0.4 K/uL (ref 0.1–1.0)
Monocytes Relative: 7.5 % (ref 3.0–12.0)
Neutro Abs: 2.9 K/uL (ref 1.4–7.7)
Neutrophils Relative %: 54.7 % (ref 43.0–77.0)
Platelets: 279 K/uL (ref 150.0–400.0)
RBC: 4.99 Mil/uL (ref 3.87–5.11)
RDW: 14.3 % (ref 11.5–15.5)
WBC: 5.3 K/uL (ref 4.0–10.5)

## 2024-12-11 LAB — COMPREHENSIVE METABOLIC PANEL WITH GFR
ALT: 13 U/L (ref 3–35)
AST: 20 U/L (ref 5–37)
Albumin: 4.6 g/dL (ref 3.5–5.2)
Alkaline Phosphatase: 99 U/L (ref 39–117)
BUN: 13 mg/dL (ref 6–23)
CO2: 28 meq/L (ref 19–32)
Calcium: 9.5 mg/dL (ref 8.4–10.5)
Chloride: 103 meq/L (ref 96–112)
Creatinine, Ser: 0.8 mg/dL (ref 0.40–1.20)
GFR: 83.18 mL/min
Glucose, Bld: 99 mg/dL (ref 70–99)
Potassium: 4 meq/L (ref 3.5–5.1)
Sodium: 138 meq/L (ref 135–145)
Total Bilirubin: 0.8 mg/dL (ref 0.2–1.2)
Total Protein: 8 g/dL (ref 6.0–8.3)

## 2024-12-11 LAB — VITAMIN B12: Vitamin B-12: >1500 pg/mL — ABNORMAL HIGH (ref 211–911)

## 2024-12-11 LAB — LIPID PANEL
Cholesterol: 246 mg/dL — ABNORMAL HIGH (ref 28–200)
HDL: 76.4 mg/dL
LDL Cholesterol: 135 mg/dL — ABNORMAL HIGH (ref 10–99)
NonHDL: 169.96
Total CHOL/HDL Ratio: 3
Triglycerides: 177 mg/dL — ABNORMAL HIGH (ref 10.0–149.0)
VLDL: 35.4 mg/dL (ref 0.0–40.0)

## 2024-12-11 LAB — HEMOGLOBIN A1C: Hgb A1c MFr Bld: 6.2 % (ref 4.6–6.5)

## 2024-12-11 LAB — TSH: TSH: 1.6 u[IU]/mL (ref 0.35–5.50)

## 2024-12-11 LAB — VITAMIN D 25 HYDROXY (VIT D DEFICIENCY, FRACTURES): VITD: 51.23 ng/mL (ref 30.00–100.00)

## 2024-12-11 MED ORDER — ROSUVASTATIN CALCIUM 10 MG PO TABS: 10.0000 mg | ORAL_TABLET | Freq: Every day | ORAL | 3 refills | Status: AC

## 2024-12-11 MED ORDER — CYANOCOBALAMIN 1000 MCG/ML IJ SOLN
1000.0000 ug | Freq: Once | INTRAMUSCULAR | Status: AC
  Administered 2024-12-11: 1000 ug via INTRAMUSCULAR

## 2024-12-11 MED ORDER — FLUTICASONE PROPIONATE 50 MCG/ACT NA SUSP: 1.0000 | Freq: Every day | NASAL | 6 refills | Status: AC

## 2024-12-11 NOTE — Patient Instructions (Signed)
 I am increasing your cholesterol medication.   Please follow up with me in 8 weeks fasting (nothing but water 8 hours prior to your visit).   Cut back on sugar, rice, bread, potatoes, and pasta to help lower your blood sugars.    Prediabetes: Eating Plan Prediabetes is when your levels of blood sugar, also called glucose, are higher than normal. This can put you at risk for getting type 2 diabetes. When you have prediabetes, making healthy changes can help keep you from getting diabetes. This includes changes in your diet. Work with your health care provider or an expert in healthy eating called a dietitian. They can help you create a healthy eating plan. This plan can help you: Control your blood sugar levels. Improve your cholesterol levels. Manage your blood pressure. What are tips for following this plan? Reading food labels Read food labels to check the amount of fat and sugar in prepackaged foods. Avoid foods that have: Saturated fats. Trans fats. Added sugars. Check food labels for the amount of salt (sodium). Avoid foods that have more than 300 milligrams (mg) of salt per serving. Limit your salt intake to less than 2,300 mg each day. Shopping Avoid buying pre-made and processed foods. Avoid buying drinks with added sugar. Cooking Cook with olive oil. Do not use: Butter. Lard. Ghee. Bake, broil, grill, steam, or boil foods. Avoid frying. Meal planning  Work with your dietitian to create an eating plan that's right for you. This may include tracking how many calories you take in each day. Use a food diary, notebook, or mobile app to track what you eat at each meal. Consider following a Mediterranean diet. This includes: Eating many servings of fresh fruits and vegetables each day. Eating fish at least twice a week. Eating one serving each day of whole grains, beans, nuts, and seeds. Using olive oil instead of other fats. Limiting alcohol. Limiting red meat. Using  nonfat or low-fat dairy products. Consider following a plant-based diet. This means eating mostly: Vegetables and fruit. Grains. Beans. Nuts and seeds. If you have high blood pressure, you may need to limit your salt intake or follow a diet called the DASH eating plan. The DASH eating plan can help lower high blood pressure. Lifestyle Set weight loss goals with help from your health care team. Losing 7% of your body weight is a good goal for most people with prediabetes. Exercise for at least 30 minutes, 5 or more days a week. For support, think about joining a support group or talking with a mental health counselor. Take medicines only as told. What foods are recommended? Fruits Berries. Bananas. Apples. Oranges. Grapes. Papaya. Mango. Pomegranate. Kiwi. Grapefruit. Cherries. Vegetables Lettuce. Spinach. Peas. Beets. Cauliflower. Cabbage. Broccoli. Carrots. Tomatoes. Squash. Eggplant. Herbs. Peppers. Onions. Cucumbers. Brussels sprouts. Grains Whole grains, such as whole-wheat or whole-grain breads or pasta. Unsweetened oatmeal. Bulgur. Barley. Quinoa. Brown rice. Corn or whole-wheat flour tortillas or taco shells. Meats and other proteins Seafood. Poultry without skin. Lean cuts of pork and beef. Tofu. Eggs. Nuts. Beans. Dairy Low-fat or fat-free dairy products, such as yogurt, cottage cheese, and cheese. Beverages Water. Tea. Coffee. Sugar-free or diet soda. Seltzer water. Low-fat or nonfat milk. Milk alternatives, such as soy or almond milk. Fats and oils Olive oil. Canola oil. Sunflower oil. Grapeseed oil. Avocado. Walnuts. Sweets and desserts Sugar-free or low-fat pudding. Sugar-free or low-fat ice cream and other frozen treats. Seasonings and condiments Herbs. Salt-free spices. Mustard. Relish. Low-salt, low-sugar ketchup. Low-salt, low-sugar  barbecue sauce. Low-fat or fat-free mayonnaise. The items listed above may not be all the foods and drinks you can have. Talk with a  dietitian to learn more. What foods are not recommended? Fruits Fruits canned with syrup. Vegetables Canned vegetables. Frozen vegetables with butter or cream sauce. Grains Refined white flour and flour products, such as bread, pasta, snack foods, and cereals. Meats and other proteins Fatty cuts of meat. Poultry with skin. Breaded or fried meat. Processed meats. Dairy Full-fat yogurt, cheese, or milk. Beverages Sweetened drinks, such as iced tea and soda. Fats and oils Butter. Lard. Ghee. Sweets and desserts Baked goods, such as cake, cupcakes, pastries, cookies, and cheesecake. Seasonings and condiments Spice mixes with added salt. Ketchup. Barbecue sauce. Mayonnaise. The items listed above may not be all the foods and drinks you should avoid. Talk with a dietitian to learn more. Where to find more information American Diabetes Association: diabetes.org/food-nutrition This information is not intended to replace advice given to you by your health care provider. Make sure you discuss any questions you have with your health care provider. Document Revised: 06/25/2023 Document Reviewed: 06/25/2023 Elsevier Patient Education  2024 [employer].

## 2024-12-11 NOTE — Progress Notes (Signed)
 "  Subjective:     Patient ID: Adison Mamaril, female    DOB: 11/27/1971, 54 y.o.   MRN: [id number]  Chief Complaint  Patient presents with   Follow-up    HPI  Discussed the use of AI scribe software for clinical note transcription with the patient, who gave verbal consent to proceed.  History of Present Illness Renesme Peale is a 54 year old female who presents for follow-up on chronic health conditions and medication refills. She is accompanied by her husband.  Hyperlipidemia - Takes rosuvastatin  nightly as prescribed for cholesterol management. - Requests refill for Crestor  (rosuvastatin ).  Allergic rhinitis - Uses Flonase  nasal spray for symptom control. - Requests refill for Flonase  nasal spray.  Vitamin deficiency - June laboratory results showed low vitamin D  and B12 levels. - Takes daily vitamin D  1000 mg and vitamin B12 supplementation.  Dietary habits - Eats rice daily. - Occasionally consumes sweet beverages from a [place] store. - Has only had water so far today and has not eaten.  In person [facility] medical interpreter showed up 15 minutes past her appointment time.      Health Maintenance Due  Topic Date Due   HIV Screening  Never done   Hepatitis B Vaccines 19-59 Average Risk (1 of 3 - 19+ 3-dose series) Never done   Zoster Vaccines- Shingrix (1 of 2) Never done   Pneumococcal Vaccine: 50+ Years (1 of 1 - PCV) Never done   Mammogram  04/19/2024   COVID-19 Vaccine (4 - 2025-26 season) 08/05/2024   Cervical Cancer Screening (HPV/Pap Cotest)  01/28/2025    Past Medical History:  Diagnosis Date   Allergy    ASCUS of cervix with negative high risk HPV 09/2017   Depression    Migraine     Past Surgical History:  Procedure Laterality Date   APPENDECTOMY     ESOPHAGOGASTRODUODENOSCOPY N/A 05/03/2015   Procedure: ESOPHAGOGASTRODUODENOSCOPY (EGD);  Surgeon: Gordy CHRISTELLA Starch, MD;  Location: [facility] ENDOSCOPY;  Service: Endoscopy;  Laterality: N/A;   NASAL SINUS SURGERY      TONSILLECTOMY      Family History  Problem Relation Age of Onset   Diabetes Brother    Mental illness Brother     Social History   Socioeconomic History   Marital status: Married    Spouse name: Not on file   Number of children: 4   Years of education: Not on file   Highest education level: Not on file  Occupational History   Occupation: does not work  Tobacco Use   Smoking status: Never   Smokeless tobacco: Never  Vaping Use   Vaping status: Never Used  Substance and Sexual Activity   Alcohol use: No    Alcohol/week: 0.0 standard drinks of alcohol   Drug use: No   Sexual activity: Yes    Birth control/protection: None    Comment: 1st intercourse 55 yo-1 partner  Other Topics Concern   Not on file  Social History Narrative   ** Merged History Encounter ** Single. Education: Grade School. Exercise: walks 2 times a week for 2 hours.   Lives with husband/2025 and takes care of her brother   Social Drivers of Health   Tobacco Use: Low Risk (12/11/2024)   Patient History    Smoking Tobacco Use: Never    Smokeless Tobacco Use: Never    Passive Exposure: Not on file  Financial Resource Strain: Low Risk (11/17/2023)   Overall Financial Resource Strain (CARDIA)    Difficulty  of Paying Living Expenses: Not hard at all  Food Insecurity: No Food Insecurity (11/17/2023)   Hunger Vital Sign    Worried About Running Out of Food in the Last Year: Never true    Ran Out of Food in the Last Year: Never true  Transportation Needs: No Transportation Needs (11/17/2023)   PRAPARE - Administrator, Civil Service (Medical): No    Lack of Transportation (Non-Medical): No  Physical Activity: Insufficiently Active (11/22/2024)   Exercise Vital Sign    Days of Exercise per Week: 7 days    Minutes of Exercise per Session: 20 min  Stress: No Stress Concern Present (11/22/2024)   [employer] of Occupational Health - Occupational Stress Questionnaire    Feeling of  Stress: Not at all  Social Connections: Socially Integrated (11/22/2024)   Social Connection and Isolation Panel    Frequency of Communication with Friends and Family: Twice a week    Frequency of Social Gatherings with Friends and Family: Twice a week    Attends Religious Services: More than 4 times per year    Active Member of Clubs or Organizations: Yes    Attends Banker Meetings: More than 4 times per year    Marital Status: Married  Catering Manager Violence: Not At Risk (11/17/2023)   Humiliation, Afraid, Rape, and Kick questionnaire    Fear of Current or Ex-Partner: No    Emotionally Abused: No    Physically Abused: No    Sexually Abused: No  Depression (PHQ2-9): Low Risk (11/22/2024)   Depression (PHQ2-9)    PHQ-2 Score: 2  Recent Concern: Depression (PHQ2-9) - High Risk (10/05/2024)   Depression (PHQ2-9)    PHQ-2 Score: 11  Alcohol Screen: Low Risk (11/17/2023)   Alcohol Screen    Last Alcohol Screening Score (AUDIT): 0  Housing: Unknown (11/17/2023)   Housing Stability Vital Sign    Unable to Pay for Housing in the Last Year: No    Number of Times Moved in the Last Year: Not on file    Homeless in the Last Year: No  Utilities: Not At Risk (11/17/2023)   AHC Utilities    Threatened with loss of utilities: No  Health Literacy: Adequate Health Literacy (11/22/2024)   B1300 Health Literacy    Frequency of need for help with medical instructions: Never    Outpatient Medications Prior to Visit  Medication Sig Dispense Refill   acetaminophen (TYLENOL) 325 MG tablet Take 650 mg by mouth every 6 (six) hours as needed.     ALPRAZolam  (XANAX  XR) 1 MG 24 hr tablet Take 1 mg by mouth at bedtime.     azelastine  (ASTELIN ) 0.1 % nasal spray Place 1 spray into both nostrils 2 (two) times daily. Use in each nostril as directed 30 mL 12   cetirizine  (ZYRTEC ) 10 MG tablet Take 1 tablet (10 mg total) by mouth daily. 90 tablet 1   Cholecalciferol (VITAMIN D3) 250 MCG  (10000 UT) capsule Take 10,000 Units by mouth daily.     Cyanocobalamin  (VITAMIN B 12 PO) Take 1,000 mcg by mouth daily.     montelukast  (SINGULAIR ) 10 MG tablet TAKE 1 TABLET BY MOUTH AT BEDTIME 90 tablet 3   zonisamide (ZONEGRAN) 100 MG capsule Take 100 mg by mouth daily.     fluticasone  (FLONASE ) 50 MCG/ACT nasal spray SPRAY 2 SPRAYS INTO EACH NOSTRIL EVERY DAY 16 mL 6   rosuvastatin  (CRESTOR ) 5 MG tablet Take 1 tablet (5 mg total) by  mouth daily. 90 tablet 1   baclofen (LIORESAL) 10 MG tablet Take 10 mg by mouth 2 (two) times daily. Limit to 1 to 2 treatments per week (Patient not taking: Reported on 12/11/2024)     busPIRone  (BUSPAR ) 5 MG tablet TAKE 1 TABLET BY MOUTH 2 TIMES A DAY (Patient not taking: Reported on 11/22/2024) 60 tablet 2   olopatadine  (PATANOL) 0.1 % ophthalmic solution Place 1 drop into both eyes daily as needed for allergies. (Patient not taking: Reported on 12/11/2024) 5 mL 12   sertraline  (ZOLOFT ) 25 MG tablet Take 25 mg by mouth daily. (Patient not taking: Reported on 12/11/2024)     No facility-administered medications prior to visit.    Allergies[1]  Review of Systems  Constitutional:  Negative for chills, fever, malaise/fatigue and weight loss.  Respiratory:  Negative for shortness of breath.   Cardiovascular:  Negative for chest pain, palpitations and leg swelling.  Gastrointestinal:  Negative for abdominal pain, constipation, diarrhea, nausea and vomiting.  Neurological:  Negative for dizziness, focal weakness and headaches.  Psychiatric/Behavioral:  Negative for depression and suicidal ideas. The patient is nervous/anxious.        Objective:    Physical Exam Constitutional:      General: She is not in acute distress.    Appearance: She is not ill-appearing.  HENT:     Mouth/Throat:     Mouth: Mucous membranes are moist.     Pharynx: Oropharynx is clear.  Eyes:     Extraocular Movements: Extraocular movements intact.     Conjunctiva/sclera: Conjunctivae  normal.     Pupils: Pupils are equal, round, and reactive to light.  Cardiovascular:     Rate and Rhythm: Normal rate and regular rhythm.  Pulmonary:     Effort: Pulmonary effort is normal.     Breath sounds: Normal breath sounds.  Musculoskeletal:     Cervical back: Normal range of motion and neck supple.     Right lower leg: No edema.     Left lower leg: No edema.  Skin:    General: Skin is warm and dry.  Neurological:     General: No focal deficit present.     Mental Status: She is alert and oriented to person, place, and time.     Motor: No weakness.     Coordination: Coordination normal.     Gait: Gait normal.  Psychiatric:        Mood and Affect: Mood normal.        Behavior: Behavior normal.        Thought Content: Thought content normal.      BP 118/74 (BP Location: Left Arm, Patient Position: Sitting)   Pulse 77   Temp 98.3 F (36.8 C) (Oral)   Ht 4' 8 (1.422 m)   Wt 122 lb (55.3 kg)   LMP 09/07/2020   SpO2 99%   BMI 27.35 kg/m  Wt Readings from Last 3 Encounters:  12/11/24 122 lb (55.3 kg)  11/22/24 126 lb (57.2 kg)  08/07/24 119 lb (54 kg)       Assessment & Plan:   Problem List Items Addressed This Visit     Environmental and seasonal allergies   Relevant Medications   fluticasone  (FLONASE ) 50 MCG/ACT nasal spray   Mixed hyperlipidemia - Primary   Relevant Medications   rosuvastatin  (CRESTOR ) 10 MG tablet   Other Relevant Orders   EKG 12-Lead   Lipid panel   Prediabetes   Relevant Orders   CBC  with Differential/Platelet   Comprehensive metabolic panel with GFR   Hemoglobin A1c   TSH   Vitamin B12 deficiency   Relevant Orders   Vitamin B12   Vitamin D  deficiency   Relevant Orders   VITAMIN D  25 Hydroxy (Vit-D Deficiency, Fractures)    Assessment and Plan Assessment & Plan Mixed hyperlipidemia Cholesterol levels were elevated in June despite daily rosuvastatin  use. - Increased rosuvastatin  to 10 mg daily - Checked cholesterol  levels today - Scheduled follow-up in 8 weeks with fasting labs  Vitamin D  deficiency Vitamin D  levels remain low despite current supplementation of 1000 mg daily. - Increased vitamin D  supplementation to 2000 mg daily - Checked vitamin D  levels today  Vitamin B12 deficiency Vitamin B12 levels are low. She is currently taking B12 supplements. - Administered vitamin B12 injection today - Continue oral vitamin B12 supplementation - Checked vitamin B12 levels today  Prediabetes Increased risk of developing diabetes due to family history and dietary habits. She consumes high amounts of carbohydrates and sugary drinks. - Encouraged reduction of carbohydrate intake, including rice, potatoes, bread, and pasta - Advised on reducing sugar intake, including sugary drinks - Check A1c today   Screening EKG done showing NSR, rate 67, unremarkable. No old ones for comparison   Environmental and seasonal allergies She uses Flonase  nasal spray for allergy management. - Refilled Flonase  nasal spray  General Health Maintenance She is due for a mammogram to screen for breast cancer. - Encouraged scheduling a mammogram at the breast center     I have discontinued Skarleth Baquero's rosuvastatin . I have also changed her fluticasone . Additionally, I am having her start on rosuvastatin . Lastly, I am having her maintain her acetaminophen, azelastine , olopatadine , zonisamide, ALPRAZolam , baclofen, montelukast , cetirizine , Vitamin D3, Cyanocobalamin  (VITAMIN B 12 PO), sertraline , and busPIRone . We administered cyanocobalamin .  Meds ordered this encounter  Medications   fluticasone  (FLONASE ) 50 MCG/ACT nasal spray    Sig: Place 1 spray into both nostrils daily.    Dispense:  16 mL    Refill:  6    Supervising Provider:   ROLLENE NORRIS A [[id number]]   rosuvastatin  (CRESTOR ) 10 MG tablet    Sig: Take 1 tablet (10 mg total) by mouth daily.    Dispense:  90 tablet    Refill:  3    Supervising Provider:    ROLLENE NORRIS A [[id number]]   cyanocobalamin  (VITAMIN B12) injection 1,000 mcg       [1]  Allergies Allergen Reactions   Other Hives, Swelling and Rash    Lysol and Bleach   Bovine (Beef) Protein-Containing Drug Products Itching and Nausea And Vomiting   Shellfish Allergy Itching and Nausea And Vomiting   Amoxicillin Itching and Rash   "

## 2024-12-12 ENCOUNTER — Encounter (HOSPITAL_COMMUNITY): Payer: Self-pay | Admitting: Clinical

## 2024-12-12 ENCOUNTER — Ambulatory Visit (INDEPENDENT_AMBULATORY_CARE_PROVIDER_SITE_OTHER): Admitting: Clinical

## 2024-12-12 DIAGNOSIS — F411 Generalized anxiety disorder: Secondary | ICD-10-CM

## 2024-12-12 DIAGNOSIS — F331 Major depressive disorder, recurrent, moderate: Secondary | ICD-10-CM

## 2024-12-12 DIAGNOSIS — F4310 Post-traumatic stress disorder, unspecified: Secondary | ICD-10-CM | POA: Diagnosis not present

## 2024-12-12 DIAGNOSIS — F410 Panic disorder [episodic paroxysmal anxiety] without agoraphobia: Secondary | ICD-10-CM | POA: Diagnosis not present
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
## 2024-12-16 ENCOUNTER — Ambulatory Visit
Admission: RE | Admit: 2024-12-16 | Discharge: 2024-12-16 | Disposition: A | Source: Ambulatory Visit | Attending: Family Medicine | Admitting: Family Medicine

## 2024-12-16 DIAGNOSIS — Z1231 Encounter for screening mammogram for malignant neoplasm of breast: Secondary | ICD-10-CM

## 2024-12-18 ENCOUNTER — Ambulatory Visit (HOSPITAL_COMMUNITY): Admitting: Clinical

## 2025-01-01 ENCOUNTER — Ambulatory Visit (HOSPITAL_COMMUNITY): Admitting: Clinical

## 2025-01-15 ENCOUNTER — Ambulatory Visit (HOSPITAL_COMMUNITY): Admitting: Clinical

## 2025-01-29 ENCOUNTER — Ambulatory Visit (HOSPITAL_COMMUNITY): Admitting: Clinical

## 2025-02-05 ENCOUNTER — Ambulatory Visit: Admitting: Family Medicine
